# Patient Record
Sex: Female | Born: 1970
Health system: Southern US, Community
[De-identification: ages and names within clinical notes are randomized; demographics above are authoritative.]

## PROBLEM LIST (undated history)

## (undated) DIAGNOSIS — K219 Gastro-esophageal reflux disease without esophagitis: Secondary | ICD-10-CM

## (undated) DIAGNOSIS — K2970 Gastritis, unspecified, without bleeding: Secondary | ICD-10-CM

## (undated) DIAGNOSIS — E119 Type 2 diabetes mellitus without complications: Secondary | ICD-10-CM

## (undated) DIAGNOSIS — I10 Essential (primary) hypertension: Secondary | ICD-10-CM

## (undated) DIAGNOSIS — E785 Hyperlipidemia, unspecified: Secondary | ICD-10-CM

## (undated) HISTORY — DX: Hyperlipidemia, unspecified: E78.5

## (undated) HISTORY — DX: Type 2 diabetes mellitus without complications: E11.9

## (undated) HISTORY — DX: Gastro-esophageal reflux disease without esophagitis: K21.9

## (undated) HISTORY — DX: Essential (primary) hypertension: I10

## (undated) HISTORY — PX: PLANTAR'S WART EXCISION: SHX2240

---

## 1998-08-10 HISTORY — PX: FOOT SURGERY: SHX648

## 2013-06-24 ENCOUNTER — Ambulatory Visit: Payer: Self-pay | Attending: Internal Medicine

## 2013-06-30 ENCOUNTER — Ambulatory Visit: Payer: Self-pay | Attending: Internal Medicine

## 2013-07-14 ENCOUNTER — Ambulatory Visit: Payer: No Typology Code available for payment source | Attending: Internal Medicine | Admitting: Internal Medicine

## 2013-07-14 VITALS — BP 155/105 | HR 78 | Temp 98.9°F | Resp 16 | Ht 63.0 in | Wt 142.0 lb

## 2013-07-14 DIAGNOSIS — Z0000 Encounter for general adult medical examination without abnormal findings: Secondary | ICD-10-CM

## 2013-07-14 LAB — TSH: TSH: 0.911 u[IU]/mL (ref 0.350–4.500)

## 2013-07-14 LAB — CBC WITH DIFFERENTIAL/PLATELET
Basophils Relative: 0 % (ref 0–1)
Eosinophils Absolute: 0.1 10*3/uL (ref 0.0–0.7)
Eosinophils Relative: 2 % (ref 0–5)
HCT: 36.5 % (ref 36.0–46.0)
Hemoglobin: 13 g/dL (ref 12.0–15.0)
Lymphs Abs: 2.4 10*3/uL (ref 0.7–4.0)
MCH: 31 pg (ref 26.0–34.0)
MCHC: 35.6 g/dL (ref 30.0–36.0)
Monocytes Absolute: 0.4 10*3/uL (ref 0.1–1.0)
Monocytes Relative: 6 % (ref 3–12)
Neutro Abs: 3.9 10*3/uL (ref 1.7–7.7)
RBC: 4.2 MIL/uL (ref 3.87–5.11)
RDW: 12.9 % (ref 11.5–15.5)

## 2013-07-14 LAB — LIPID PANEL
Cholesterol: 185 mg/dL (ref 0–200)
LDL Cholesterol: 110 mg/dL — ABNORMAL HIGH (ref 0–99)
Triglycerides: 180 mg/dL — ABNORMAL HIGH (ref <150)

## 2013-07-14 LAB — BASIC METABOLIC PANEL
BUN: 10 mg/dL (ref 6–23)
CO2: 25 mEq/L (ref 19–32)
Creat: 0.45 mg/dL — ABNORMAL LOW (ref 0.50–1.10)
Glucose, Bld: 192 mg/dL — ABNORMAL HIGH (ref 70–99)
Potassium: 4 mEq/L (ref 3.5–5.3)
Sodium: 135 mEq/L (ref 135–145)

## 2013-07-14 MED ORDER — BUTALBITAL-APAP-CAFFEINE 50-325-40 MG PO TABS: 1.0000 | ORAL_TABLET | Freq: Four times a day (QID) | ORAL | Status: DC | PRN

## 2013-07-14 NOTE — Progress Notes (Signed)
Pt is here to establish care. Pt reports that her father died on 01/01/23 and ever since she heard this new she has been having chest pain and constant headaches.

## 2013-07-14 NOTE — Patient Instructions (Signed)
Dolor de cabeza, preguntas frecuentes y sus respuestas  (Headaches, Frequently Asked Questions)  CEFALEAS MIGRAÑOSAS  P: ¿Qué es la migraña? ¿Qué la ocasiona? ¿Cómo puedo tratarla?  R: En general, la migraña comienza como un dolor apagado. Luego progresa hacia un dolor, constante, punzante y como un latido. Sentirá este dolor en las sienes. Podrá sentir dolor en la parte anterior o posterior de la cabeza, o en uno o ambos lados. El dolor suele estar acompañado de una combinación de:  · Náuseas.  · Vómitos.  · Sensibilidad a la luz y los ruidos.  Algunas personas (un 15%) experimentan un aura (ver abajo) antes de un ataque. La causa de la migraña se debe a reacciones químicas del cerebro. El tratamiento para la migraña puede incluir medicamentos de venta libre. También puede incluir técnicas de autoayuda. Estas incluyen entrenamientos para la relajación y biorretroalimentación.   P: ¿Qué es un aura?  R: Alrededor del 15% de las personas con migraña tiene un "aura". Es una señal de síntomas neurológicos que ocurren antes de un dolor de cabeza por migrañas. Podrá ver líneas onduladas o irregulares, puntos o luces parpadeantes. Podrá experimentar visión de túnel o puntos ciegos en uno o ambos ojos. El aura puede incluir alucinaciones visuales o auditivas (algo que se imagina). Puede incluir trastornos en el olfato (como olores extraños), el tacto o el gusto. Entre otros síntomas se incluyen:  · Adormecimiento.  · Sensación de hormigueo.  · Dificultad para recordar o decir la palabra correcta.  Estos episodios neurológicos pueden durar hasta 60 minutos. Los síntomas desaparecerán a medida que el dolor de cabeza comience.  P:¿Qué es un disparador?  R: Ciertos factores físicos o ambientales pueden llevar a "disparar" una migraña. Estos son:  · Alimentos.  · Cambios hormonales.  · Clima.  · Estrés.  Es importante recordar que los disparadores son diferentes entre si. Para ayudar a prevenir ataques de migrañas, necesitará  descubrir cuáles son los disparadores que le afectan. Lleve un diario sobre sus dolores de cabeza. Este es un buen modo para descubrir los disparadores. El diario le ayudará en el momento de hablar con el profesional acerca de su enfermedad.  P: ¿El clima afecta en las migrañas?  R: La luz solar, el calor, la humedad y lo cambios drásticos en la presión barométrica pueden llevar a, o "disparar" un ataque de migraña en algunas personas. Pero estudios han demostrado que el clima no actúa como disparador para todas las personas con migraña.  P: ¿Cuál es la relación entre la migraña y la hormonas?  R: Las hormonas inician y regulan muchas de las funciones corporales. Las hormonas mantienen el balance en el cuerpo dentro de los constantes cambios de ambiente. Algunas veces, el nivel de hormonas en el cuerpo se desbalancea. Por ejemplo, durante la menstruación, el embarazo o la menopausia. Pueden ser la causa de un ataque de migraña. De hecho, alrededor de tres cuartos de las mujeres con migraña informan que sus ataques están relacionados con el ciclo menstrual.   P: ¿Aumenta el riesgo de sufrir un choque cardíaco en las personas que padecen migraña?  R: La probabilidad de que un ataque de migraña ocasione un ataque cardíaco es muy remota. Esto no quiere decir que una persona que sufre de migraña no pueda tener un ataque cardíaco asociado con ella. En las personas menores de 40 años, el factor más común para un ataque es la migraña. Pero durante la vida de una persona, la ocurrencia de un dolor de cabeza   por migraña está asociada con una reducción en el riesgo de morir por un ataque cerebrovascular.   P: ¿Cuáles son los medicamentos para la migraña?  R: La medicación precisa se utiliza para tratar el dolor de cabeza una vez que ha comenzado. Son ejemplos, medicamentos de venta libre, desinflamatorios sin esteroides, ergotamínicos y triptanos.   P: ¿Qué son los triptanos?  R: Lo triptanos son una nueva clase de  medicamentos abortivos. Son específicos para tratar este problema. Los triptanos son vasoconstrictores. Moderan algunas reacciones químicas del cerebro. Los triptanos trabajan como receptores del cerebro. Ayudan a restaurar el balance de un neurotransmisor denominado serotonina. Se cree que las fluctuaciones en los niveles de serotonina son la causa principal de la migraña.   P: ¿Son efectivos los medicamentos de venta libre para la migraña?  R: Los medicamentos de venta libre pueden ser efectivos para aliviar dolores leves a moderados y los síntomas asociados a la migraña. Pero deberá consultar a un médico antes de comenzar cualquier tratamiento para la migraña.   P: ¿Cuáles son los medicamentos de prevención de la migraña?  R: Se suele denominar tratamiento "profiláctico" a los medicamentos para la prevención de la migraña. Se utilizan para reducir la frecuencia, gravedad y duración de los ataques de migraña. Son ejemplos de medicamentos de prevención: antiepilépticos, antidepresivos, bloqueadores beta, bloqueadores de los canales de calcio y medicamentos antiinflamatorios sin esteroides.  P: ¿ Por qué se utilizan anticonvulsivantes para tratar la migraña?  R: Durante los últimos años, ha habido un creciente interés en las drogas antiepilépticas para la prevención de la migraña. A menudo se los conoce como "anticonvulsivantes". La epilepsia y la migraña suceden por reacciones similares en el cerebro.   P: ¿ Por qué se utilizan antidepresivos para tratar la migraña?  R: Los antidepresivos típicamente se utilizan para tratar a las personas con depresión. Pueden reducir la frecuencia de la migraña a través de la regulación de los niveles químicos, como la serotonina, en el cerebro.   P: ¿ Por qué se utilizan terapias alternativas para tratar la migraña?  R: El término "terapias alternativas" suelen utilizarse para describir los tratamientos que se considera que están por fuera de alcance la medicina occidental  convencional. Son ejemplos de las terapias alternativas: la acupuntura, la acupresión y el yoga. Otra terapia alternativa común es la terapia herbal. Se cree que algunas hierbas ayudan a aliviar los dolores de cabeza. Siempre consulte con el profesional acerca de las terapias alternativas antes de utilizarlas. Algunos productos herbales contienen arsénico y otras toxinas.  DOLORES DE CABEZA POR TENSIÓN  P: ¿Qué es un dolor de cabeza por tensión? ¿Qué lo ocasiona? ¿Cómo puedo tratarlo?  R: Los dolores de cabeza por tensión ocurren al azar. A menudo son el resultado de estrés temporario, ansiedad, fatiga o ira. Los síntomas incluyen dolor en las sienes, una sensación como de tener una banda alrededor de la cabeza (un dolor que "presiona"). Los síntomas pueden incluir una sensación de empuje, de presión y contracción de los músculos de la cabeza y el cuello. El dolor comienza en la frente, sienes o en la parte posterior de la cabeza y el cuello. El tratamiento para los dolores de cabeza por tensión puede incluir medicamentos de venta libre. También puede incluir técnicas de autoayuda con entrenamientos para la relajación y biorretroalimentación.  CEFALEA EN RACIMOS  P: ¿Qué es una cefalea en racimos? ¿Qué la ocasiona? ¿Cómo puedo tratarla?  R: La cefalea en racimos toma su nombre debido a que los ataques vienen   en grupos. El dolor aparece con poco o ningún aviso. Normalmente ocurre de un lado de la cabeza. Muchas veces el dolor viene acompañado de un lagrimeo u ojo rojo y goteo de la nariz del mismo lado que el dolor. Se cree que la causa es una reacción en las sustancias químicas del cerebro. Se describe como el caso más grave e intenso de cualquier tipo de dolor de cabeza. El tratamiento incluye medicamentos bajo receta y oxígeno.  CEFALEA SINUSAL  P: ¿Qué es una cefalea sinusal? ¿Qué la ocasiona? ¿Cómo puedo tratarla?  R: Cuando se inflama una cavidad en los huesos de la cara y el cráneo (sinus) ocasiona un dolor  localizado. Esta enfermedad generalmente es el resultado de una reacción alérgica, un tumor o una infección. Si el dolor de cabeza está ocasionado por un bloqueo del sinus, como una infección, probablemente tendrá fiebre. Una imagen de rayos X confirmará el bloqueo del sinus. El tratamiento indicado por el médico podrá incluir antibióticos para la infección, y también antihistamínicos o descongestivos.   DOLOR DE CABEZA POR EFECTO "REBOTE"  P: ¿Qué es un dolor de cabeza por efecto "rebote"? ¿Qué lo ocasiona? ¿Cómo puedo tratarlo?  R: Si se toman medicamentos para el dolor de cabeza muy a menudo puede llevar a la enfermedad conocida como "dolor de cabeza por rebote". Un patrón de abuso de medicamentos para el dolor de cabeza supone tomarlos más de dos veces por semana o en cantidades excesivas. Esto significa más que lo que indica el envase o el médico. Con los dolores de cabeza por rebote, los medicamentos no sólo dejan de aliviar el dolor sino que además comienzan a ocasionar dolores de cabeza. Los médicos tratan los dolores de cabeza por rebote mediante la disminución del medicamento del que se ha abusado. A veces el medico podrá sustituir gradualmente por un tipo diferente de tratamiento o medicación. Dejar de consumirlo podría ser difícil. El abuso regular de un medicamento aumenta el potencial que se produzcan efectos secundarios graves. Consulte con un médico si utiliza regularmente medicamentos para el dolor de cabeza más de dos días por semana o más de lo que indica el envase.  PREGUNTAS Y RESPUESTAS ADICIONALES  P: ¿Qué es la biorretroalimentación?  R: La biorretroalimentación es un tratamiento de autoayuda. La biorretroalimentación utiliza un equipamiento especial para controlar los movimientos involuntarios del cuerpo y las respuestas físicas. La biorretroalimentación controla:  · Respiración.  · Pulso.  · Latidos cardíacos.  · Temperatura.  · Tensión muscular.  · Actividad cerebrales.  La  biorretroalimentación le ayudará a mejorar y perfeccionar sus ejercicios de relajación. Aprenderá a controlar las respuestas físicas relacionadas con el estrés. Una vez que se dominan las técnicas no necesitará más el equipamiento.  P: ¿Son hereditarios los dolores de cabeza?  R: Según algunas estimaciones, aproximadamente 28 millones de estadounidenses sufren migraña. Cuatro de cada cinco (80%) informan una historia familiar de migraña. Los investigadores no pueden asegurar si se trata de un problema genético o una predisposición familiar. A pesar de esto, un niño tiene 50% de probabilidades de sufrir migraña si uno de sus padres la sufre. El niño tiene un 75% de probabilidades si ambos padres la sufren.   P. ¿Puede un niño tener migraña?  R: En el momento de ingresar a la escuela secundaria, la mayoría de los jóvenes han experimentado algún tipo de cefalea. Algunos abordajes o medicamentos seguros y efectivos pueden evitar las cefaleas o detenerlas luego de que han comenzado.   P. ¿Qué tipo de   especialista debe ver para diagnosticar y tratar una cefalea?  R: Comience con su médico de cabecera. Converse acerca de su experiencia y abordaje de las cefaleas. Comente los métodos de clasificación, diagnóstico y tratamiento. El profesional decidirá si lo derivará a un especialista, según los síntomas u otras enfermedades. El hecho de sufrir diabetes, alergias, etc, puede requerir un abordaje más complejo. La [employer] ([employer] las Cefaleas) proporcionará, a pedido, una lista de los médicos que son miembros de su estado.  Document Released: 07/09/2008 Document Revised: 10/19/2011  Exit[employer]® Patient Information ©2014 [employer].

## 2013-07-14 NOTE — Progress Notes (Signed)
Patient ID: Connie Russell, female   DOB: 06-05-1971, 42 y.o.   MRN: [id number]  CC: establish care  HPI: 41 year old female with no significant past medical history who presented to clinic to establish PCP. No complaints at this time., Does not take any meds.  No Known Allergies History reviewed. No pertinent past medical history. No current outpatient prescriptions on file prior to visit.   No current facility-administered medications on file prior to visit.   Family History  Problem Relation Age of Onset  . Diabetes Mother   . Hypertension Mother   . Diabetes Maternal Grandmother    History   Social History  . Marital Status: Unknown    Spouse Name: N/A    Number of Children: N/A  . Years of Education: N/A   Occupational History  . Not on file.   Social History Main Topics  . Smoking status: Never Smoker   . Smokeless tobacco: Not on file  . Alcohol Use: No  . Drug Use: No  . Sexual Activity: Yes   Other Topics Concern  . Not on file   Social History Narrative  . No narrative on file    Review of Systems  Constitutional: Negative for fever, chills, diaphoresis, activity change, appetite change and fatigue.  HENT: Negative for ear pain, nosebleeds, congestion, facial swelling, rhinorrhea, neck pain, neck stiffness and ear discharge.   Eyes: Negative for pain, discharge, redness, itching and visual disturbance.  Respiratory: Negative for cough, choking, chest tightness, shortness of breath, wheezing and stridor.   Cardiovascular: Negative for chest pain, palpitations and leg swelling.  Gastrointestinal: Negative for abdominal distention.  Genitourinary: Negative for dysuria, urgency, frequency, hematuria, flank pain, decreased urine volume, difficulty urinating and dyspareunia.  Musculoskeletal: Negative for back pain, joint swelling, arthralgias and gait problem.  Neurological: Negative for dizziness, tremors, seizures, syncope, facial asymmetry, speech  difficulty, weakness, light-headedness, numbness and headaches.  Hematological: Negative for adenopathy. Does not bruise/bleed easily.  Psychiatric/Behavioral: Negative for hallucinations, behavioral problems, confusion, dysphoric mood, decreased concentration and agitation.    Objective:   Filed Vitals:   07/14/13 1648  BP: 155/105  Pulse: 78  Temp: 98.9 F (37.2 C)  Resp: 16    Physical Exam  Constitutional: Appears well-developed and well-nourished. No distress.  HENT: Normocephalic. External right and left ear normal. Oropharynx is clear and moist.  Eyes: Conjunctivae and EOM are normal. PERRLA, no scleral icterus.  Neck: Normal ROM. Neck supple. No JVD. No tracheal deviation. No thyromegaly.  CVS: RRR, S1/S2 +, no murmurs, no gallops, no carotid bruit.  Pulmonary: Effort and breath sounds normal, no stridor, rhonchi, wheezes, rales.  Abdominal: Soft. BS +,  no distension, tenderness, rebound or guarding.  Musculoskeletal: Normal range of motion. No edema and no tenderness.  Lymphadenopathy: No lymphadenopathy noted, cervical, inguinal. Neuro: Alert. Normal reflexes, muscle tone coordination. No cranial nerve deficit. Skin: Skin is warm and dry. No rash noted. Not diaphoretic. No erythema. No pallor.  Psychiatric: Normal mood and affect. Behavior, judgment, thought content normal.   No results found for this basename: WBC, HGB, HCT, MCV, PLT   No results found for this basename: CREATININE, BUN, NA, K, CL, CO2    No results found for this basename: HGBA1C   Lipid Panel  No results found for this basename: chol, trig, hdl, cholhdl, vldl, ldlcalc       Assessment and plan:   Patient Active Problem List   Diagnosis Date Noted  . Preventative health  care 07/14/2013    Priority: Medium - check blood work today.

## 2013-08-14 ENCOUNTER — Ambulatory Visit: Payer: No Typology Code available for payment source | Attending: Internal Medicine | Admitting: Internal Medicine

## 2013-08-14 ENCOUNTER — Encounter: Payer: Self-pay | Admitting: Internal Medicine

## 2013-08-14 VITALS — BP 158/89 | HR 82 | Temp 98.4°F | Resp 17 | Wt 143.8 lb

## 2013-08-14 DIAGNOSIS — IMO0001 Reserved for inherently not codable concepts without codable children: Secondary | ICD-10-CM

## 2013-08-14 DIAGNOSIS — H9202 Otalgia, left ear: Secondary | ICD-10-CM

## 2013-08-14 DIAGNOSIS — R7309 Other abnormal glucose: Secondary | ICD-10-CM

## 2013-08-14 DIAGNOSIS — H9209 Otalgia, unspecified ear: Secondary | ICD-10-CM

## 2013-08-14 DIAGNOSIS — E785 Hyperlipidemia, unspecified: Secondary | ICD-10-CM | POA: Insufficient documentation

## 2013-08-14 DIAGNOSIS — R739 Hyperglycemia, unspecified: Secondary | ICD-10-CM | POA: Insufficient documentation

## 2013-08-14 DIAGNOSIS — R030 Elevated blood-pressure reading, without diagnosis of hypertension: Secondary | ICD-10-CM | POA: Insufficient documentation

## 2013-08-14 DIAGNOSIS — I10 Essential (primary) hypertension: Secondary | ICD-10-CM | POA: Insufficient documentation

## 2013-08-14 LAB — POCT GLYCOSYLATED HEMOGLOBIN (HGB A1C): Hemoglobin A1C: 6.2

## 2013-08-14 MED ORDER — IBUPROFEN 600 MG PO TABS: 600.0000 mg | ORAL_TABLET | Freq: Three times a day (TID) | ORAL | Status: DC | PRN

## 2013-08-14 NOTE — Progress Notes (Signed)
MRN: [id number] Name: Connie Russell  Sex: female Age: 43 y.o. DOB: 07/26/1971  Allergies: Review of patient's allergies indicates no known allergies.  Chief Complaint  Patient presents with  . Follow-up    HPI: Patient is 43 y.o. female who comes today for followup her she had a blood work done which was reviewed with the patient noticed hyperglycemia, her hemoglobin A1c is 6.2%, today her blood pressure is also elevated denies any family history of diabetes, she reported to have left ear pain denies any discharge fever chills sore throat.  History reviewed. No pertinent past medical history.  History reviewed. No pertinent past surgical history.    Medication List       This list is accurate as of: 08/14/13  5:36 PM.  Always use your most recent med list.               butalbital-acetaminophen-caffeine 50-325-40 MG per tablet  Commonly known as:  FIORICET  Take 1-2 tablets by mouth every 6 (six) hours as needed for headache.     ibuprofen 600 MG tablet  Commonly known as:  ADVIL,MOTRIN  Take 1 tablet (600 mg total) by mouth every 8 (eight) hours as needed.        Meds ordered this encounter  Medications  . ibuprofen (ADVIL,MOTRIN) 600 MG tablet    Sig: Take 1 tablet (600 mg total) by mouth every 8 (eight) hours as needed.    Dispense:  30 tablet    Refill:  0    Immunization History  Administered Date(s) Administered  . Influenza Split 06/24/2013    Family History  Problem Relation Age of Onset  . Diabetes Mother   . Hypertension Mother   . Diabetes Maternal Grandmother     History  Substance Use Topics  . Smoking status: Never Smoker   . Smokeless tobacco: Not on file  . Alcohol Use: No    Review of Systems  As noted in HPI  Filed Vitals:   08/14/13 1657  BP: 158/89  Pulse: 82  Temp: 98.4 F (36.9 C)  Resp: 17    Physical Exam  Physical Exam  HENT:  Both TMs visualized not congested both are decreased in all clear minimal wax  no erythema  Eyes: EOM are normal. Pupils are equal, round, and reactive to light.  Cardiovascular: Normal rate and regular rhythm.   Pulmonary/Chest: Breath sounds normal. No respiratory distress. She has no wheezes. She has no rales.  Musculoskeletal: She exhibits no edema.    CBC    Component Value Date/Time   WBC 6.8 07/14/2013 1708   RBC 4.20 07/14/2013 1708   HGB 13.0 07/14/2013 1708   HCT 36.5 07/14/2013 1708   PLT 287 07/14/2013 1708   MCV 86.9 07/14/2013 1708   LYMPHSABS 2.4 07/14/2013 1708   MONOABS 0.4 07/14/2013 1708   EOSABS 0.1 07/14/2013 1708   BASOSABS 0.0 07/14/2013 1708    CMP     Component Value Date/Time   NA 135 07/14/2013 1708   K 4.0 07/14/2013 1708   CL 101 07/14/2013 1708   CO2 25 07/14/2013 1708   GLUCOSE 192* 07/14/2013 1708   BUN 10 07/14/2013 1708   CREATININE 0.45* 07/14/2013 1708   CALCIUM 8.8 07/14/2013 1708    Lab Results  Component Value Date/Time   CHOL 185 07/14/2013  5:08 PM    No components found with this basename: hga1c    No results found for this basename: AST  Assessment and Plan  Elevated BP  Other and unspecified hyperlipidemia  Hyperglycemia - Plan: HgB A1c 6.2%  Otalgia, left - Plan: ibuprofen (ADVIL,MOTRIN) 600 MG tablet  I have advised patient for low carbohydrate low-fat low-salt diet, she'll follow in 6 weeks if persistently elevated her blood pressure consider starting on antihypertensives.  Return in about 6 weeks (around 09/25/2013).  Doris Cheadle, MD

## 2013-08-14 NOTE — Progress Notes (Signed)
Patient here for follow up Complains of left ear pain past week Bothers her more at night

## 2013-08-14 NOTE — Patient Instructions (Signed)
Dieta reducida en sodio, 2 gramos de sodio (2 Gram Low Sodium Diet) Esta dieta restringe la cantidad de sodio a no ms de 2 gramos o 2000 miligramos (mg) por da. La cantidad de sodio se limita para ayudar a disminuir la presin arterial y es importante para la insuficiencia cardaca y los problemas renales y hepticos. Muchos alimentos contienen sodio para mejorar el sabor y algunas veces como conservantes. Por lo tanto, cuando es necesario disminuir la cantidad de sodio en la dieta, es importante saber que productos hay que buscar cuando se eligen alimentos y bebidas. Puede demorar algn tiempo acostumbrarse a realizar cambios dietarios. Aqu se incluye informacin y una gua que lo ayudarn a adaptarse a una dieta reducida en sodio.  CONSEJOS PRCTICOS  No le agregue sal a los alimentos.  Evite los comidas de preparacin rpida que generalmente son elevadas en sodio.  Elija colaciones sin sal.  Compre productos con bajo contenido de sodio, en los que en su etiqueta diga: "bajo contenido de sodio" o "sin agregado de sal".  Verifique las etiquetas de los alimentos para conocer cunto sodio hay en una porcin.  Cuando coma en un restaurante, pida que preparen su comida con menos sal, y sin nada de sal en lo posible. LECTURA DE LAS ETIQUETAS DE LOS ALIMENTOS PARA INFORMARSE ACERCA DEL SODIO QUE CONTIENEN. La seccin Informacin Nutricional es un buen lugar en el que hallar cunta cantidad de sodio contienen los alimentos. Busque productos que no contengan ms de 500-600 mg/comida y no ms de 150 mg. por porcin Recuerde que 2 g = 2000 mg. La etiqueta de los alimentos tambin puede informar de ste modo:  Sin contenido de sodio: Menos de cinco mg. por porcin.  Muy bajo contenido de sodio: 35 mg o menos por porcin.  Bajo contenido de sodio: 140 mg o menos por porcin.  Bajo en sodio: 50% menos de sodio por porcin. Por ejemplo, si un alimento generalmente contiene 300 mg de sodio se  modifica para ser considerado bajo en sodio, tendr 150 mg de sodio.  Reducido en sodio: 25% menos de sodio por porcin. Por ejemplo, si un alimento que generalmente tiene 400 mg de sodio se modifica para ser reducido en sodio, tendr 300 mg de sodio. ELECCIN DE LOS ALIMENTOS Granos  Evite: Galletitas y colaciones saladas. Algunos cereales, incluyendo cereales calientes. Panificados y mezclas para bizcochos. Arroz condimentado o mezclas para pastas.  Elija: Colaciones sin sal. Cereales con bajo contenido de sodio, avena, arroz y trigo inflado, cereal para el desayuno. Pan y bollitos tipo Ingls. Pastas. Carnes  Evite: Saladas, en lata, ahumadas, condimentadas, en escabeche incluyendo pescado y pollo. Tocino, jamn, salchichas, fiambres, panchos, anchoas.  Elija: Atn y salmn enlatado con bajo contenido de sodio. Carne, pollo o pescado frescos o congelados. Lcteos  Evite: Quesos procesados y cremosos. Queso cottage. Manteca y leche condensada. Quesos comunes.  Elija: Leche. Queso cottage con bajo contenido de sodio. Yogur. Crema. Quesos con bajo contenido de sodio. Frutas y Vegetales  Evite: Vegetales comunes en lata. Salsa de tomate y pastas en lata. Vegetales en salsas congelados. Aceitunas. Pickles. Salsas. Chucrut.  Elija: Vegetales en lata con bajo contenido de sodio. Salsa de tomate y pastas con bajo contenido de sodio. Vegetales frescos o congelados. Frutas frescas y congeladas. Condimentos  Evite: Salsas en lata y envasadas. Salsa [place]. Salsa trtara. Salsa Barbecue. Salsa de soja. Salsa de carne. Ketchup. Sal de cebolla, de ajo y sal de mesa. Saborizantes y tiernizantes para carne.  Elija:   Hierbas y especias frescas y secas. Variedades de mostaza y ketchup con bajo contenido de sodio. Jugo de limn. Salsa tabasco. Rbanos picantes. EJEMPLO DE PLAN ALIMENTARIO CON 2 GRAMOS DE SODIO Desayuno / Sodio (mg)  1 taza de leche descremada / 143 mg  2 rebanadas de pan  integral tostado / 270 mg  1 cucharada de margarina en tubo saludable para el corazn / 153 mg  1 huevo duro / 139 mg  1 naranja pequea / 0 mg Almuerzo / Sodio (mg)  1 taza de zanahorias crudas / 76 mg   taza de garbanzos / 298 mg  1 taza de leche descremada / 143 mg   taza de uvas negras / 2 mg  1 pan de pita de salvado / 356 mg Cena / Sodio (mg)  1 taza de pasta de salvado / 2 mg  1 taza de jugo de tomates bajo en sodio / 73 mg  3 onzas de carne picada magra / 57 mg  1 ensalada pequea (1 taza de espicanas crudas,  taza de pepinos,  taza de pimientos amarillos) con 1 cucharada de aceite de oliva y 1 cucharada de vinagre de vino / 25 mg Colacin / Sodio (mg)  1 pote de yogur de vainilla descremado / 107 mg  3 crackers de graham / 127 mg Anlisis nutricional  Caloras: 2033  Protenas: 77 g  Hidratos de carbono: 282 g  Grasa: 72 g  Sodio: 1.971 mg Document Released: 01/18/2006 Document Revised: 10/19/2011 [employer] Patient Information 2014 [employer].  

## 2013-09-25 ENCOUNTER — Ambulatory Visit: Payer: No Typology Code available for payment source | Admitting: Internal Medicine

## 2013-09-29 ENCOUNTER — Ambulatory Visit: Payer: No Typology Code available for payment source | Attending: Internal Medicine | Admitting: Internal Medicine

## 2013-09-29 ENCOUNTER — Encounter: Payer: Self-pay | Admitting: Internal Medicine

## 2013-09-29 VITALS — BP 149/100 | HR 77 | Temp 98.4°F | Resp 16

## 2013-09-29 DIAGNOSIS — R0609 Other forms of dyspnea: Secondary | ICD-10-CM | POA: Insufficient documentation

## 2013-09-29 DIAGNOSIS — R0989 Other specified symptoms and signs involving the circulatory and respiratory systems: Secondary | ICD-10-CM | POA: Insufficient documentation

## 2013-09-29 DIAGNOSIS — Z139 Encounter for screening, unspecified: Secondary | ICD-10-CM

## 2013-09-29 DIAGNOSIS — I10 Essential (primary) hypertension: Secondary | ICD-10-CM | POA: Insufficient documentation

## 2013-09-29 DIAGNOSIS — R7301 Impaired fasting glucose: Secondary | ICD-10-CM | POA: Insufficient documentation

## 2013-09-29 DIAGNOSIS — R079 Chest pain, unspecified: Secondary | ICD-10-CM

## 2013-09-29 MED ORDER — AMLODIPINE BESYLATE 5 MG PO TABS: 5.0000 mg | ORAL_TABLET | Freq: Every day | ORAL | Status: DC

## 2013-09-29 NOTE — Patient Instructions (Signed)
DASH Diet  The DASH diet stands for "Dietary Approaches to Stop Hypertension." It is a healthy eating plan that has been shown to reduce high blood pressure (hypertension) in as little as 14 days, while also possibly providing other significant health benefits. These other health benefits include reducing the risk of breast cancer after menopause and reducing the risk of type 2 diabetes, heart disease, colon cancer, and stroke. Health benefits also include weight loss and slowing kidney failure in patients with chronic kidney disease.   DIET GUIDELINES  · Limit salt (sodium). Your diet should contain less than 1500 mg of sodium daily.  · Limit refined or processed carbohydrates. Your diet should include mostly whole grains. Desserts and added sugars should be used sparingly.  · Include small amounts of heart-healthy fats. These types of fats include nuts, oils, and tub margarine. Limit saturated and trans fats. These fats have been shown to be harmful in the body.  CHOOSING FOODS   The following food groups are based on a 2000 calorie diet. See your Registered Dietitian for individual calorie needs.  Grains and Grain Products (6 to 8 servings daily)  · Eat More Often: Whole-wheat bread, brown rice, whole-grain or wheat pasta, quinoa, popcorn without added fat or salt (air popped).  · Eat Less Often: White bread, white pasta, white rice, cornbread.  Vegetables (4 to 5 servings daily)  · Eat More Often: Fresh, frozen, and canned vegetables. Vegetables may be raw, steamed, roasted, or grilled with a minimal amount of fat.  · Eat Less Often/Avoid: Creamed or fried vegetables. Vegetables in a cheese sauce.  Fruit (4 to 5 servings daily)  · Eat More Often: All fresh, canned (in natural juice), or frozen fruits. Dried fruits without added sugar. One hundred percent fruit juice (½ cup [237 mL] daily).  · Eat Less Often: Dried fruits with added sugar. Canned fruit in light or heavy syrup.  Lean Meats, Fish, and Poultry (2  servings or less daily. One serving is 3 to 4 oz [85-114 g]).  · Eat More Often: Ninety percent or leaner ground beef, tenderloin, sirloin. Round cuts of beef, chicken breast, turkey breast. All fish. Grill, bake, or broil your meat. Nothing should be fried.  · Eat Less Often/Avoid: Fatty cuts of meat, turkey, or chicken leg, thigh, or wing. Fried cuts of meat or fish.  Dairy (2 to 3 servings)  · Eat More Often: Low-fat or fat-free milk, low-fat plain or light yogurt, reduced-fat or part-skim cheese.  · Eat Less Often/Avoid: Milk (whole, 2%). Whole milk yogurt. Full-fat cheeses.  Nuts, Seeds, and Legumes (4 to 5 servings per week)  · Eat More Often: All without added salt.  · Eat Less Often/Avoid: Salted nuts and seeds, canned beans with added salt.  Fats and Sweets (limited)  · Eat More Often: Vegetable oils, tub margarines without trans fats, sugar-free gelatin. Mayonnaise and salad dressings.  · Eat Less Often/Avoid: Coconut oils, palm oils, butter, stick margarine, cream, half and half, cookies, candy, pie.  FOR MORE INFORMATION  The Dash Diet Eating Plan: www.dashdiet.org  Document Released: 07/16/2011 Document Revised: 10/19/2011 Document Reviewed: 07/16/2011  ExitCare® Patient Information ©2014 [employer].

## 2013-09-29 NOTE — Progress Notes (Signed)
MRN: [id number] Name: Connie Russell  Sex: female Age: 43 y.o. DOB: 1971-07-25  Allergies: Review of patient's allergies indicates no known allergies.  Chief Complaint  Patient presents with  . Follow-up    HPI: Patient is 43 y.o. female who comes today reported to have on and off chest pain and exertional dyspnea for the last 3-4 months, denies any headache or dizziness occasionally has PND denies any , She reported to have family history of heart disease, she also has history of hypertension as per patient one year ago she was taking some medication today her blood pressure is 149/100 currently patient denies any chest pain, denies smoking cigarettes, she has history of impaired fasting glucose and had been advised to cut down on carbohydrates.  History reviewed. No pertinent past medical history.  History reviewed. No pertinent past surgical history.    Medication List       This list is accurate as of: 09/29/13  5:06 PM.  Always use your most recent med list.               amLODipine 5 MG tablet  Commonly known as:  NORVASC  Take 1 tablet (5 mg total) by mouth daily.     butalbital-acetaminophen-caffeine 50-325-40 MG per tablet  Commonly known as:  FIORICET  Take 1-2 tablets by mouth every 6 (six) hours as needed for headache.     ibuprofen 600 MG tablet  Commonly known as:  ADVIL,MOTRIN  Take 1 tablet (600 mg total) by mouth every 8 (eight) hours as needed.        Meds ordered this encounter  Medications  . amLODipine (NORVASC) 5 MG tablet    Sig: Take 1 tablet (5 mg total) by mouth daily.    Dispense:  90 tablet    Refill:  3    Immunization History  Administered Date(s) Administered  . Influenza Split 06/24/2013    Family History  Problem Relation Age of Onset  . Diabetes Mother   . Hypertension Mother   . Diabetes Maternal Grandmother     History  Substance Use Topics  . Smoking status: Never Smoker   . Smokeless tobacco: Not on  file  . Alcohol Use: No    Review of Systems   As noted in HPI  Filed Vitals:   09/29/13 1640  BP: 149/100  Pulse: 77  Temp: 98.4 F (36.9 C)  Resp: 16    Physical Exam  Physical Exam  Constitutional: No distress.  Eyes: EOM are normal. Pupils are equal, round, and reactive to light.  Cardiovascular: Normal rate and regular rhythm.   Pulmonary/Chest: Breath sounds normal. No respiratory distress. She has no wheezes. She has no rales.  Musculoskeletal: She exhibits no edema.    CBC    Component Value Date/Time   WBC 6.8 07/14/2013 1708   RBC 4.20 07/14/2013 1708   HGB 13.0 07/14/2013 1708   HCT 36.5 07/14/2013 1708   PLT 287 07/14/2013 1708   MCV 86.9 07/14/2013 1708   LYMPHSABS 2.4 07/14/2013 1708   MONOABS 0.4 07/14/2013 1708   EOSABS 0.1 07/14/2013 1708   BASOSABS 0.0 07/14/2013 1708    CMP     Component Value Date/Time   NA 135 07/14/2013 1708   K 4.0 07/14/2013 1708   CL 101 07/14/2013 1708   CO2 25 07/14/2013 1708   GLUCOSE 192* 07/14/2013 1708   BUN 10 07/14/2013 1708   CREATININE 0.45* 07/14/2013 1708   CALCIUM  8.8 07/14/2013 1708    Lab Results  Component Value Date/Time   CHOL 185 07/14/2013  5:08 PM    No components found with this basename: hga1c    No results found for this basename: AST    Assessment and Plan  Chest pain/Exertional dyspnea  - Plan: EKG 12-Lead, no acute ST-T changes , continue with aspirin 81 mg daily Ambulatory referral to Cardiology  Essential hypertension, benign - Plan: I have advised patient follow salt diet and also started on  amLODipine (NORVASC) 5 MG tablet daily, patient will come back in 2 weeks for BP check.  IFG (impaired fasting glucose) Have advised patient for low carbohydrate diet, will get fasting blood glucose level on the next visit. Her  Screening - Plan: MM DIGITAL SCREENING BILATERAL   Fasting blood work on the next visit.   Return in about 6 weeks (around 11/10/2013) for BP check in 2 weeks .  Doris CheadleADVANI,  Geraldyne Barraclough, MD

## 2013-09-29 NOTE — Progress Notes (Signed)
Patient here for follow up Complains of having chest pains and pain down her left leg This has been going since september

## 2013-10-04 ENCOUNTER — Ambulatory Visit: Payer: No Typology Code available for payment source | Attending: Cardiology | Admitting: Cardiology

## 2013-10-04 ENCOUNTER — Encounter: Payer: Self-pay | Admitting: Cardiology

## 2013-10-04 VITALS — BP 142/94 | HR 82 | Temp 98.6°F | Resp 16 | Ht 64.0 in | Wt 138.0 lb

## 2013-10-04 DIAGNOSIS — R079 Chest pain, unspecified: Secondary | ICD-10-CM | POA: Insufficient documentation

## 2013-10-04 DIAGNOSIS — I10 Essential (primary) hypertension: Secondary | ICD-10-CM

## 2013-10-04 DIAGNOSIS — R0602 Shortness of breath: Secondary | ICD-10-CM | POA: Insufficient documentation

## 2013-10-04 DIAGNOSIS — R209 Unspecified disturbances of skin sensation: Secondary | ICD-10-CM | POA: Insufficient documentation

## 2013-10-04 MED ORDER — AMLODIPINE BESYLATE 5 MG PO TABS: 10.0000 mg | ORAL_TABLET | Freq: Every day | ORAL | Status: DC

## 2013-10-04 MED ORDER — AMLODIPINE BESYLATE 10 MG PO TABS: 10.0000 mg | ORAL_TABLET | Freq: Every day | ORAL | Status: DC

## 2013-10-04 NOTE — Progress Notes (Signed)
Ms Connie Russell comes today because of chest discomfort and numbness in her right arm. Since starting amlodipine 5 mg a day this has resolved. Her EKG is normal except for an incomplete right bundle which was obtained on February 20.  Her physical diagnosis shows improved blood pressure but not quite at goal. Heart rate is 60 at rest. Cardiovascular exam is normal.

## 2013-10-04 NOTE — Assessment & Plan Note (Signed)
I've asked her to increase her amlodipine to 10 mg per day. She's been advised to take each morning about the same time. She's also been advised to avoid as much sodium as possible. She will return to see primary care in 6 weeks. Blood pressure can be reassessed at that time. No cardiac workup at this time.

## 2013-10-04 NOTE — Progress Notes (Signed)
Pt is here because she had SOB, chest pain, and numbness in her right arm. Pt states that since she was put on BP medication she has not had anymore symptoms.

## 2013-10-13 ENCOUNTER — Ambulatory Visit: Payer: No Typology Code available for payment source | Attending: Internal Medicine

## 2013-10-13 VITALS — BP 115/75 | HR 83 | Resp 16

## 2013-10-13 DIAGNOSIS — Z136 Encounter for screening for cardiovascular disorders: Secondary | ICD-10-CM | POA: Insufficient documentation

## 2013-10-13 DIAGNOSIS — R51 Headache: Secondary | ICD-10-CM

## 2013-10-13 MED ORDER — BUTALBITAL-APAP-CAFFEINE 50-325-40 MG PO TABS: 1.0000 | ORAL_TABLET | Freq: Four times a day (QID) | ORAL | Status: AC | PRN

## 2013-10-13 MED ORDER — BUTALBITAL-APAP-CAFFEINE 50-325-40 MG PO TABS: 1.0000 | ORAL_TABLET | Freq: Four times a day (QID) | ORAL | Status: DC | PRN

## 2013-10-13 NOTE — Addendum Note (Signed)
Addended by: Allayne StackWINFREE, Satoria Dunlop R on: 10/13/2013 03:21 PM   Modules accepted: Orders

## 2013-10-13 NOTE — Patient Instructions (Signed)
Pt is to keep taking the medications as prescribed.

## 2013-10-13 NOTE — Addendum Note (Signed)
Addended by: Allayne StackWINFREE, Jenesys Casseus R on: 10/13/2013 03:28 PM   Modules accepted: Orders

## 2013-10-13 NOTE — Progress Notes (Signed)
Pt is here for a BP check only

## 2013-11-07 ENCOUNTER — Ambulatory Visit: Payer: No Typology Code available for payment source | Admitting: Internal Medicine

## 2014-01-04 ENCOUNTER — Ambulatory Visit: Payer: No Typology Code available for payment source | Attending: Internal Medicine | Admitting: *Deleted

## 2014-01-04 VITALS — BP 108/71 | HR 88 | Temp 98.1°F | Resp 16 | Wt 136.2 lb

## 2014-01-04 DIAGNOSIS — K5900 Constipation, unspecified: Secondary | ICD-10-CM

## 2014-01-04 MED ORDER — POLYETHYLENE GLYCOL 3350 17 GM/SCOOP PO POWD: 17.0000 g | Freq: Two times a day (BID) | ORAL | Status: DC | PRN

## 2014-01-04 NOTE — Patient Instructions (Signed)
Dieta con alto contenido de fibra  (High [employer] Diet) La fibra se encuentra en frutas, verduras y granos. Una dieta con alto contenido en fibras se favorece con la adicin de ms granos enteros, legumbres, frutas y verduras en su dieta. La cantidad recomendada de fibra para los hombres adultos es de 38 g por da. Para las mujeres adultas es de 25 g por da. Las [employer] y las que amamantan deben consumir 27 gramos de fibra por Training and development officer. Si usted tiene un problema digestivo o intestinal, consulte a su mdico antes de la adicin de alimentos ricos en fibra a su dieta. Coma una variedad de alimentos ricos en fibra en lugar de slo unos pocos.  OBJETIVO   Aumentar la masa fecal.  Tener deposiciones ms regulares para evitar el estreimiento.  Reducir el colesterol.  Para evitar comer en exceso. [place]?   En caso de estreimiento y hemorroides.  En caso de diverticulosis no complicada (enfermedad intestinal) y en el sndrome del colon irritable.  Si necesita ayuda para el control de [place].  Si desea mejorar su dieta como medida de proteccin contra la aterosclerosis, la diabetes y Science writer. [place] y cereales integrales.  Frutas, como las [place], [place], pltanos, fresas, Development worker, community y peras.  Verduras, como guisantes, zanahorias, batatas, remolachas, brcoli, repollo, espinacas y alcauciles.  Legumbres, las arvejas, soja, lentejas.  Almendras. CONTENIDO DE FIBRA DE LOS ALIMENTOS  Almidones y granos / Heritage manager (g)   Cheerios, 1 taza / 3 g  Corn Flakes, 1 taza / 0,7 g  Arroz inflado, 1  tazas / 0,3 g  Harina de avena instantnea (cocida),  taza / 2 g  Cereal de trigo escarchado, 1 taza / 5,1 g  Arroz marrn grano largo (cocido), 1 taza / 3,5 g  Arroz blanco grano largo (cocido), 1 taza / 0,6 g  Macarrones enriquecidos (cocidos), 1 taza / 2,5 g Legumbres / Fibra Diettica (g)   Frijoles cocidos (enlatados, crudos o  vegetarianos),  taza / 5,2 g  Frijoles (enlatados),  taza / 6,8 g  Frijoles pintos (cocidos),  taza / 5,5 g Panes y Administrator / Heritage manager (g)   Galletas de graham o miel, 2 plazas / 0,7 g  Galletitas saladas, 3 unidades / 0,3 g  Pretzels salados comunes, 10 pedazos / 1,8 g  Pan integral, 1 rebanada / 1,9 g  Pan blanco, 1 rebanada / 0,7 g  Pan con pasas, 1 rebanada / 1,2 g  Bagel 3 oz / 2 g  Tortilla de harina, 1 oz / 0.9 g  Tortilla de maz, 1 pequea / 1,5 g  Pan de [place]burguesa o hot dog, 1 pequeo / 0,9 g Frutas / Fibra Diettica (g)   Manzana con piel, 1 mediana / 4,4 g  Pur de [employer],  taza / 1,5 g  [place],  mediano / 1,5 g  Uvas, 10 uvas / 0,4 g  Naranja, 1 pequea / 2,3 g  Pasas, 1,5 oz / 1.6 g  Meln, 1 taza / 1,4 g Vegetales / Fibra Diettica (g)   Judas verdes (en conserva),  taza / 1,3 g  Zanahorias (cocido),  taza / 2,3 g  Broccoli (cocido),  taza / 2,8 g  Guisantes (cocidos),  taza / 4,4 g  Pur de papas,  taza / 1,6 g  Lechuga, 1 taza / 0,5 g  Maz (en lata),  taza / 1,6 g  Tomate,  taza / 1,1 g  1 cup / 3 g. Document Released: 07/27/2005 Document Revised: 01/26/2012 [facility] Patient Information 2014 [place], [place]. Constipacin (Constipation) Se llama constipacin cuando:   Elimina heces (mueve el intestino) menos de 3 veces por semana.  Tiene dificultad para mover el intestino.  Las heces son secas y duras o son ms grandes que lo normal. CUIDADOS EN EL HOGAR   Consuma ms fibra que se encuentra en frutas, verduras y granos enteros como arroz integral y frijoles.  Consuma menos alimentos ricos en grasas y azcar. Estos incluyen patatas fritas, hamburguesas, galletas, dulces y refrescos.  Si no consume suficientes alimentos ricos en fibras, tome productos que tengan agregado de fibra (suplementos).  Beba gran cantidad de lquido para mantener el pis (orina) de tono claro o amarillo plido.  Vaya  al bao cuando sienta la necesidad de ir. No espere.  Slo debe tomar los [employer] se los han recetado. No tome medicamentos que le ayuden a Licensed conveyancer intestino (laxantes) sin antes consultarlo con su mdico.  Haga ejercicio en forma regular, o como lo indique su mdico. SOLICITE AYUDA DE INMEDIATO SI:   Observa sangre brillante en las heces (materia fecal).  El estreimiento dura ms de 4 das o [place].  Siente dolor en el vientre (abdomen) o en el ano (recto).  Las heces son delgadas (como un lpiz).  Pierde peso de [place] inexplicable. ASEGRESE DE QUE:   Comprende estas instrucciones.  Controlar su enfermedad.  Solicitar ayuda de inmediato si no mejora o si empeora. Document Released: 08/29/2010 Document Revised: 10/19/2011 [facility] Patient Information 2014 [place], [place].

## 2014-01-04 NOTE — Progress Notes (Unsigned)
Patient in today complaining of stomach pain. Patient states she has no vomiting. Patient states she has not had a bowel movement in 2-3 days, only a small one with gas and bloating. Patient states she tried metamucil with no relief. Consulted with Cletus Gash, NP who prescribed Miralax twice a day until patient has a good bowel movement then can decrease to once a day. Reather Laurence, RN

## 2014-01-15 ENCOUNTER — Ambulatory Visit: Payer: No Typology Code available for payment source | Attending: Internal Medicine | Admitting: Internal Medicine

## 2014-01-15 ENCOUNTER — Encounter: Payer: Self-pay | Admitting: Internal Medicine

## 2014-01-15 VITALS — BP 122/80 | HR 73 | Temp 98.6°F | Resp 16 | Wt 138.6 lb

## 2014-01-15 DIAGNOSIS — Z139 Encounter for screening, unspecified: Secondary | ICD-10-CM

## 2014-01-15 DIAGNOSIS — K219 Gastro-esophageal reflux disease without esophagitis: Secondary | ICD-10-CM | POA: Insufficient documentation

## 2014-01-15 DIAGNOSIS — R531 Weakness: Secondary | ICD-10-CM

## 2014-01-15 DIAGNOSIS — I10 Essential (primary) hypertension: Secondary | ICD-10-CM | POA: Insufficient documentation

## 2014-01-15 DIAGNOSIS — K5900 Constipation, unspecified: Secondary | ICD-10-CM | POA: Insufficient documentation

## 2014-01-15 DIAGNOSIS — R5381 Other malaise: Secondary | ICD-10-CM

## 2014-01-15 DIAGNOSIS — Z79899 Other long term (current) drug therapy: Secondary | ICD-10-CM | POA: Insufficient documentation

## 2014-01-15 DIAGNOSIS — R5383 Other fatigue: Secondary | ICD-10-CM

## 2014-01-15 LAB — CBC WITH DIFFERENTIAL/PLATELET
BASOS ABS: 0 10*3/uL (ref 0.0–0.1)
BASOS PCT: 0 % (ref 0–1)
Eosinophils Absolute: 0.1 10*3/uL (ref 0.0–0.7)
Eosinophils Relative: 1 % (ref 0–5)
HEMATOCRIT: 38 % (ref 36.0–46.0)
Hemoglobin: 13.5 g/dL (ref 12.0–15.0)
LYMPHS PCT: 35 % (ref 12–46)
Lymphs Abs: 2.8 10*3/uL (ref 0.7–4.0)
MCH: 31.2 pg (ref 26.0–34.0)
MCHC: 35.5 g/dL (ref 30.0–36.0)
MCV: 87.8 fL (ref 78.0–100.0)
MONO ABS: 0.5 10*3/uL (ref 0.1–1.0)
Monocytes Relative: 6 % (ref 3–12)
Neutro Abs: 4.6 10*3/uL (ref 1.7–7.7)
Neutrophils Relative %: 58 % (ref 43–77)
PLATELETS: 328 10*3/uL (ref 150–400)
RBC: 4.33 MIL/uL (ref 3.87–5.11)
RDW: 12.5 % (ref 11.5–15.5)
WBC: 7.9 10*3/uL (ref 4.0–10.5)

## 2014-01-15 LAB — VITAMIN B12: Vitamin B-12: 665 pg/mL (ref 211–911)

## 2014-01-15 MED ORDER — OMEPRAZOLE 20 MG PO CPDR: 20.0000 mg | DELAYED_RELEASE_CAPSULE | Freq: Every day | ORAL | Status: DC

## 2014-01-15 NOTE — Progress Notes (Signed)
Patient here with interpreter Here for follow up on her blood pressure 

## 2014-01-15 NOTE — Progress Notes (Signed)
MRN: [id number] Name: Connie Russell  Sex: female Age: 43 y.o. DOB: October 01, 1970  Allergies: Review of patient's allergies indicates no known allergies.  Chief Complaint  Patient presents with  . Blood Pressure Check    HPI: Patient is 43 y.o. female who History of hypertension comes today for followup, as per patient she is taking her blood pressure medication regularly she is on Norvasc 10 mg, she also has history of constipation and was prescribed MiraLAX and reports some improvement, also patient is complaining of lot of GERD symptoms and recently has been feeling tired denies any irregular menstrual periods or menorrhagia. As per patient she is up-to-date with Pap smear. She has not had a mammogram done recently.  History reviewed. No pertinent past medical history.  History reviewed. No pertinent past surgical history.    Medication List       This list is accurate as of: 01/15/14  3:55 PM.  Always use your most recent med list.               amLODipine 10 MG tablet  Commonly known as:  NORVASC  Take 1 tablet (10 mg total) by mouth daily.     butalbital-acetaminophen-caffeine 50-325-40 MG per tablet  Commonly known as:  FIORICET  Take 1-2 tablets by mouth every 6 (six) hours as needed for headache.     ibuprofen 600 MG tablet  Commonly known as:  ADVIL,MOTRIN  Take 1 tablet (600 mg total) by mouth every 8 (eight) hours as needed.     omeprazole 20 MG capsule  Commonly known as:  PRILOSEC  Take 1 capsule (20 mg total) by mouth daily.     polyethylene glycol powder powder  Commonly known as:  GLYCOLAX/MIRALAX  Take 17 g by mouth 2 (two) times daily as needed.        Meds ordered this encounter  Medications  . omeprazole (PRILOSEC) 20 MG capsule    Sig: Take 1 capsule (20 mg total) by mouth daily.    Dispense:  30 capsule    Refill:  3    Immunization History  Administered Date(s) Administered  . Influenza Split 06/24/2013    Family History    Problem Relation Age of Onset  . Diabetes Mother   . Hypertension Mother   . Diabetes Maternal Grandmother     History  Substance Use Topics  . Smoking status: Never Smoker   . Smokeless tobacco: Not on file  . Alcohol Use: No    Review of Systems   As noted in HPI  Filed Vitals:   01/15/14 1539  BP: 122/80  Pulse: 73  Temp: 98.6 F (37 C)  Resp: 16    Physical Exam  Physical Exam  Constitutional: No distress.  Eyes: EOM are normal. Pupils are equal, round, and reactive to light.  Cardiovascular: Normal rate and regular rhythm.   Pulmonary/Chest: Breath sounds normal. No respiratory distress. She has no wheezes. She has no rales.  Abdominal: Soft. There is no tenderness. There is no rebound.    CBC    Component Value Date/Time   WBC 6.8 07/14/2013 1708   RBC 4.20 07/14/2013 1708   HGB 13.0 07/14/2013 1708   HCT 36.5 07/14/2013 1708   PLT 287 07/14/2013 1708   MCV 86.9 07/14/2013 1708   LYMPHSABS 2.4 07/14/2013 1708   MONOABS 0.4 07/14/2013 1708   EOSABS 0.1 07/14/2013 1708   BASOSABS 0.0 07/14/2013 1708    CMP  Component Value Date/Time   NA 135 07/14/2013 1708   K 4.0 07/14/2013 1708   CL 101 07/14/2013 1708   CO2 25 07/14/2013 1708   GLUCOSE 192* 07/14/2013 1708   BUN 10 07/14/2013 1708   CREATININE 0.45* 07/14/2013 1708   CALCIUM 8.8 07/14/2013 1708    Lab Results  Component Value Date/Time   CHOL 185 07/14/2013  5:08 PM    No components found with this basename: hga1c    No results found for this basename: AST    Assessment and Plan  Hypertension, essential Blood pressure is well controlled continue with Norvasc 10 mg daily.  Unspecified constipation Continue with current medications, advise for increased fluid and fiber.  GERD (gastroesophageal reflux disease) - Plan: Trial of omeprazole (PRILOSEC) 20 MG capsule  Weakness - Plan: Will check CBC with Differential, Vit D  25 hydroxy (rtn osteoporosis monitoring), Vitamin B12  Screening -  Plan: MM DIGITAL SCREENING BILATERAL   Health Maintenance  -Mammogram: Ordered    Return in about 3 months (around 04/17/2014) for hypertension.  Doris Cheadle, MD

## 2014-01-16 ENCOUNTER — Telehealth: Payer: Self-pay

## 2014-01-16 LAB — VITAMIN D 25 HYDROXY (VIT D DEFICIENCY, FRACTURES): Vit D, 25-Hydroxy: 34 ng/mL (ref 30–89)

## 2014-01-16 NOTE — Telephone Encounter (Signed)
Message copied by Lestine Mount on Tue Jan 16, 2014 10:08 AM ------      Message from: Doris Cheadle      Created: Tue Jan 16, 2014  9:52 AM       Call and let the Patient know that blood work is normal.       ------

## 2014-01-16 NOTE — Telephone Encounter (Signed)
Interpreter line used Patient is aware of her lab results 

## 2014-01-17 ENCOUNTER — Ambulatory Visit: Payer: No Typology Code available for payment source | Attending: Internal Medicine

## 2014-02-15 ENCOUNTER — Ambulatory Visit: Payer: No Typology Code available for payment source | Attending: Internal Medicine | Admitting: Internal Medicine

## 2014-02-15 ENCOUNTER — Encounter: Payer: Self-pay | Admitting: Internal Medicine

## 2014-02-15 VITALS — BP 119/73 | HR 92 | Temp 98.2°F | Resp 16

## 2014-02-15 DIAGNOSIS — R739 Hyperglycemia, unspecified: Secondary | ICD-10-CM

## 2014-02-15 DIAGNOSIS — L259 Unspecified contact dermatitis, unspecified cause: Secondary | ICD-10-CM

## 2014-02-15 DIAGNOSIS — R300 Dysuria: Secondary | ICD-10-CM

## 2014-02-15 DIAGNOSIS — R7309 Other abnormal glucose: Secondary | ICD-10-CM

## 2014-02-15 LAB — POCT URINALYSIS DIPSTICK
Bilirubin, UA: NEGATIVE
GLUCOSE UA: NEGATIVE
Ketones, UA: NEGATIVE
Leukocytes, UA: NEGATIVE
Nitrite, UA: NEGATIVE
Protein, UA: NEGATIVE
RBC UA: NEGATIVE
SPEC GRAV UA: 1.025
UROBILINOGEN UA: 0.2
pH, UA: 6

## 2014-02-15 LAB — POCT GLYCOSYLATED HEMOGLOBIN (HGB A1C): Hemoglobin A1C: 5.8

## 2014-02-15 MED ORDER — DIPHENHYDRAMINE HCL 25 MG PO TABS: 25.0000 mg | ORAL_TABLET | Freq: Four times a day (QID) | ORAL | Status: DC | PRN

## 2014-02-15 MED ORDER — HYDROCORTISONE 2.5 % EX CREA: TOPICAL_CREAM | Freq: Two times a day (BID) | CUTANEOUS | Status: DC

## 2014-02-15 MED ORDER — METHYLPREDNISOLONE SODIUM SUCC 125 MG IJ SOLR
125.0000 mg | Freq: Once | INTRAMUSCULAR | Status: AC
  Administered 2014-02-15: 125 mg via INTRAMUSCULAR

## 2014-02-15 NOTE — Progress Notes (Signed)
   Subjective:    Patient ID: Connie Russell, female    DOB: 1970/08/15, 43 y.o.   MRN: [id number]  Rash This is a new problem. The current episode started 1 to 4 weeks ago. The problem has been gradually worsening since onset. The rash is diffuse. The rash is characterized by blistering, draining, redness, itchiness and burning. She was exposed to plant contact. Past treatments include anti-itch cream (medication her daughter was prescribed).      Review of Systems  Constitutional: Negative.   Genitourinary: Positive for dysuria. Negative for vaginal discharge.  Skin: Positive for rash.       Objective:   Physical Exam  Constitutional: She is oriented to person, place, and time. She appears well-developed and well-nourished.  HENT:  Head: Normocephalic.  Cardiovascular: Normal rate and regular rhythm.   Pulmonary/Chest: Effort normal and breath sounds normal.  Genitourinary: Vaginal discharge found.  Musculoskeletal: Normal range of motion.  Neurological: She is alert and oriented to person, place, and time.  Skin: Skin is warm and dry. Rash noted. There is erythema.             Assessment & Plan:  Connie Russell was seen today for rash.  Diagnoses and associated orders for this visit:  Contact dermatitis - methylPREDNISolone sodium succinate (SOLU-MEDROL) 125 mg/2 mL injection 125 mg; Inject 2 mLs (125 mg total) into the muscle once. - Discontinue: hydrocortisone 2.5 % cream; Apply topically 2 (two) times daily. - diphenhydrAMINE (BENADRYL) 25 MG tablet; Take 1 tablet (25 mg total) by mouth every 6 (six) hours as needed for itching. - hydrocortisone 2.5 % cream; Apply topically 2 (two) times daily. Apply to rash areas, do not apply to face  Dysuria - POCT urinalysis dipstick  Hyperglycemia - POCT glycosylated hemoglobin (Hb A1C)   Holland CommonsKECK, VALERIE, NP 02/15/2014 6:15 PM

## 2014-02-15 NOTE — Progress Notes (Signed)
Patient here today for c/o rash on both arms, hands, lower extremities and vaginal area. Patient states her daughters have the same rash and were diagnosed with Poison Ivy. Patient states onset was last Wednesday. Patient states the rash itches and burns. Patient has tried different ointments with no relief.

## 2014-04-18 ENCOUNTER — Ambulatory Visit: Payer: No Typology Code available for payment source | Attending: Internal Medicine | Admitting: Internal Medicine

## 2014-04-18 ENCOUNTER — Encounter: Payer: Self-pay | Admitting: Internal Medicine

## 2014-04-18 VITALS — BP 109/72 | HR 92 | Temp 97.9°F | Resp 22 | Ht 62.5 in | Wt 147.4 lb

## 2014-04-18 DIAGNOSIS — I10 Essential (primary) hypertension: Secondary | ICD-10-CM | POA: Insufficient documentation

## 2014-04-18 DIAGNOSIS — M25561 Pain in right knee: Secondary | ICD-10-CM

## 2014-04-18 DIAGNOSIS — K219 Gastro-esophageal reflux disease without esophagitis: Secondary | ICD-10-CM | POA: Insufficient documentation

## 2014-04-18 DIAGNOSIS — H538 Other visual disturbances: Secondary | ICD-10-CM | POA: Insufficient documentation

## 2014-04-18 DIAGNOSIS — M25569 Pain in unspecified knee: Secondary | ICD-10-CM

## 2014-04-18 DIAGNOSIS — Z79899 Other long term (current) drug therapy: Secondary | ICD-10-CM | POA: Insufficient documentation

## 2014-04-18 DIAGNOSIS — R002 Palpitations: Secondary | ICD-10-CM | POA: Insufficient documentation

## 2014-04-18 MED ORDER — IBUPROFEN 600 MG PO TABS: 600.0000 mg | ORAL_TABLET | Freq: Three times a day (TID) | ORAL | Status: DC | PRN

## 2014-04-18 MED ORDER — TRAMADOL HCL 50 MG PO TABS: 50.0000 mg | ORAL_TABLET | Freq: Three times a day (TID) | ORAL | Status: DC | PRN

## 2014-04-18 NOTE — Progress Notes (Signed)
MRN: [id number] Name: Connie Russell  Sex: female Age: 43 y.o. DOB: 1971-01-03  Allergies: Review of patient's allergies indicates no known allergies.  Chief Complaint  Patient presents with  . Follow-up  . Hypertension    HPI: Patient is 43 y.o. female who has history of hypertension, GERD, hyperlipidemia comes today reported to have palpitation and shortness of breath a few nights ago which lasted for a few hours, denies any chest pain headache or dizziness, does complain of blurry vision, her blood pressure is well controlled, she also reports occasional lower extremity edema, also complaining of right knee pain on and off for several months she denies any recent fall or trauma, as per patient she used to see a specialist and got injection in the past.  Past Medical History  Diagnosis Date  . Hypertension     History reviewed. No pertinent past surgical history.    Medication List       This list is accurate as of: 04/18/14  3:22 PM.  Always use your most recent med list.               amLODipine 10 MG tablet  Commonly known as:  NORVASC  Take 1 tablet (10 mg total) by mouth daily.     butalbital-acetaminophen-caffeine 50-325-40 MG per tablet  Commonly known as:  FIORICET  Take 1-2 tablets by mouth every 6 (six) hours as needed for headache.     diphenhydrAMINE 25 MG tablet  Commonly known as:  BENADRYL  Take 1 tablet (25 mg total) by mouth every 6 (six) hours as needed for itching.     hydrocortisone 2.5 % cream  Apply topically 2 (two) times daily. Apply to rash areas, do not apply to face     ibuprofen 600 MG tablet  Commonly known as:  ADVIL,MOTRIN  Take 1 tablet (600 mg total) by mouth every 8 (eight) hours as needed.     omeprazole 20 MG capsule  Commonly known as:  PRILOSEC  Take 1 capsule (20 mg total) by mouth daily.     polyethylene glycol powder powder  Commonly known as:  GLYCOLAX/MIRALAX  Take 17 g by mouth 2 (two) times daily as  needed.     traMADol 50 MG tablet  Commonly known as:  ULTRAM  Take 1 tablet (50 mg total) by mouth every 8 (eight) hours as needed for moderate pain.        Meds ordered this encounter  Medications  . traMADol (ULTRAM) 50 MG tablet    Sig: Take 1 tablet (50 mg total) by mouth every 8 (eight) hours as needed for moderate pain.    Dispense:  30 tablet    Refill:  0  . ibuprofen (ADVIL,MOTRIN) 600 MG tablet    Sig: Take 1 tablet (600 mg total) by mouth every 8 (eight) hours as needed.    Dispense:  30 tablet    Refill:  3    Immunization History  Administered Date(s) Administered  . Influenza Split 06/24/2013    Family History  Problem Relation Age of Onset  . Hypertension Mother   . Diabetes Maternal Grandmother     History  Substance Use Topics  . Smoking status: Never Smoker   . Smokeless tobacco: Not on file  . Alcohol Use: No    Review of Systems   As noted in HPI  Filed Vitals:   04/18/14 1436  BP: 109/72  Pulse: 92  Temp: 97.9 F (36.6  C)  Resp: 22    Physical Exam  Physical Exam  Constitutional: No distress.  Eyes: EOM are normal. Pupils are equal, round, and reactive to light.  Cardiovascular: Normal rate.   Pulmonary/Chest: Breath sounds normal. No respiratory distress. She has no wheezes. She has no rales.  Musculoskeletal: She exhibits no edema.  Right knee tenderness anteriorly , good ROM, crepiation+ve    CBC    Component Value Date/Time   WBC 7.9 01/15/2014 1554   RBC 4.33 01/15/2014 1554   HGB 13.5 01/15/2014 1554   HCT 38.0 01/15/2014 1554   PLT 328 01/15/2014 1554   MCV 87.8 01/15/2014 1554   LYMPHSABS 2.8 01/15/2014 1554   MONOABS 0.5 01/15/2014 1554   EOSABS 0.1 01/15/2014 1554   BASOSABS 0.0 01/15/2014 1554    CMP     Component Value Date/Time   NA 135 07/14/2013 1708   K 4.0 07/14/2013 1708   CL 101 07/14/2013 1708   CO2 25 07/14/2013 1708   GLUCOSE 192* 07/14/2013 1708   BUN 10 07/14/2013 1708   CREATININE 0.45* 07/14/2013 1708    CALCIUM 8.8 07/14/2013 1708    Lab Results  Component Value Date/Time   CHOL 185 07/14/2013  5:08 PM    No components found with this basename: hga1c    No results found for this basename: AST    Assessment and Plan  Right knee pain - Plan: DG Knee Complete 4 Views Right, traMADol (ULTRAM) 50 MG tablet, ibuprofen (ADVIL,MOTRIN) 600 MG tablet  Palpitations - Plan: Maybe related to  anxiety Her last TSH level was normal, as patient reported occasional lower extremity edema, will get 2D Echocardiogram with bubble study  Blurry vision - Plan: Ambulatory referral to Ophthalmology  Hypertension, essential - Plan: Will repeat blood chemistry COMPLETE METABOLIC PANEL WITH GFR  Return in about 3 months (around 07/18/2014) for hypertension.  Doris Cheadle, MD

## 2014-04-18 NOTE — Progress Notes (Signed)
Patient presents for f/u on HTN C/O 7 month history of intermittent right knee pain, worsens at nght making it difficult to sleep Also c/o bilateral foot swelling for 2 months States 6 days ago woke with rapid heart beat and difficulty breathing that lasted 5 hours.  States at that time took aspirin 81 mg with no relief

## 2014-04-19 ENCOUNTER — Telehealth: Payer: Self-pay | Admitting: *Deleted

## 2014-04-19 LAB — COMPLETE METABOLIC PANEL WITH GFR
ALK PHOS: 58 U/L (ref 39–117)
ALT: 15 U/L (ref 0–35)
AST: 15 U/L (ref 0–37)
Albumin: 4.4 g/dL (ref 3.5–5.2)
BILIRUBIN TOTAL: 0.2 mg/dL (ref 0.2–1.2)
BUN: 11 mg/dL (ref 6–23)
CO2: 30 mEq/L (ref 19–32)
Calcium: 9.4 mg/dL (ref 8.4–10.5)
Chloride: 97 mEq/L (ref 96–112)
Creat: 0.63 mg/dL (ref 0.50–1.10)
GFR, Est African American: >89 mL/min
GFR, Est Non African American: >89 mL/min
Glucose, Bld: 149 mg/dL — ABNORMAL HIGH (ref 70–99)
POTASSIUM: 4.5 meq/L (ref 3.5–5.3)
Sodium: 136 mEq/L (ref 135–145)
TOTAL PROTEIN: 7.2 g/dL (ref 6.0–8.3)

## 2014-04-19 NOTE — Telephone Encounter (Signed)
Message copied by Dyann Kief on Thu Apr 19, 2014 12:22 PM ------      Message from: Doris Cheadle      Created: Thu Apr 19, 2014 10:22 AM       Blood work reviewed noticed impaired fasting glucose, call and advise patient for low carbohydrate diet.       ------

## 2014-04-19 NOTE — Telephone Encounter (Signed)
Pt aware of results 

## 2014-05-10 ENCOUNTER — Ambulatory Visit (HOSPITAL_COMMUNITY)
Admission: RE | Admit: 2014-05-10 | Discharge: 2014-05-10 | Disposition: A | Discharge status: Home / Self Care | Payer: No Typology Code available for payment source | Source: Ambulatory Visit | Attending: Internal Medicine | Admitting: Internal Medicine

## 2014-05-10 DIAGNOSIS — Z139 Encounter for screening, unspecified: Secondary | ICD-10-CM

## 2014-05-11 ENCOUNTER — Telehealth: Payer: Self-pay | Admitting: Internal Medicine

## 2014-05-21 ENCOUNTER — Ambulatory Visit: Payer: No Typology Code available for payment source

## 2014-05-25 ENCOUNTER — Ambulatory Visit: Payer: No Typology Code available for payment source | Attending: Internal Medicine | Admitting: Pharmacist

## 2014-05-25 DIAGNOSIS — Z029 Encounter for administrative examinations, unspecified: Secondary | ICD-10-CM | POA: Insufficient documentation

## 2014-05-25 DIAGNOSIS — Z23 Encounter for immunization: Secondary | ICD-10-CM

## 2014-09-07 ENCOUNTER — Ambulatory Visit: Payer: No Typology Code available for payment source

## 2014-10-10 ENCOUNTER — Other Ambulatory Visit: Payer: Self-pay | Admitting: Cardiology

## 2014-10-10 ENCOUNTER — Other Ambulatory Visit: Payer: Self-pay | Admitting: *Deleted

## 2014-10-10 ENCOUNTER — Other Ambulatory Visit: Payer: Self-pay | Admitting: Internal Medicine

## 2014-10-10 DIAGNOSIS — I10 Essential (primary) hypertension: Secondary | ICD-10-CM

## 2014-10-10 MED ORDER — AMLODIPINE BESYLATE 10 MG PO TABS: 10.0000 mg | ORAL_TABLET | Freq: Every day | ORAL | Status: DC

## 2014-10-10 NOTE — Telephone Encounter (Signed)
Pt called requesting medication refill on amLODipine (NORVASC) 10 MG tablet. Pt is completely out of medication, please f/u with pt.

## 2014-10-10 NOTE — Telephone Encounter (Signed)
Refill send to [facility] Pt was advised to schedule F/U appointment with PCP for futures refills

## 2014-10-22 ENCOUNTER — Encounter: Payer: Self-pay | Admitting: Internal Medicine

## 2014-10-22 ENCOUNTER — Ambulatory Visit: Payer: No Typology Code available for payment source | Attending: Internal Medicine | Admitting: Internal Medicine

## 2014-10-22 ENCOUNTER — Ambulatory Visit (HOSPITAL_COMMUNITY)
Admission: RE | Admit: 2014-10-22 | Discharge: 2014-10-22 | Disposition: A | Discharge status: Home / Self Care | Payer: No Typology Code available for payment source | Source: Ambulatory Visit | Attending: Internal Medicine | Admitting: Internal Medicine

## 2014-10-22 VITALS — BP 115/78 | HR 94 | Temp 98.0°F | Resp 16 | Wt 148.2 lb

## 2014-10-22 DIAGNOSIS — I10 Essential (primary) hypertension: Secondary | ICD-10-CM | POA: Insufficient documentation

## 2014-10-22 DIAGNOSIS — Z833 Family history of diabetes mellitus: Secondary | ICD-10-CM | POA: Insufficient documentation

## 2014-10-22 DIAGNOSIS — Z760 Encounter for issue of repeat prescription: Secondary | ICD-10-CM | POA: Insufficient documentation

## 2014-10-22 DIAGNOSIS — M25561 Pain in right knee: Secondary | ICD-10-CM | POA: Insufficient documentation

## 2014-10-22 DIAGNOSIS — K219 Gastro-esophageal reflux disease without esophagitis: Secondary | ICD-10-CM | POA: Insufficient documentation

## 2014-10-22 DIAGNOSIS — M179 Osteoarthritis of knee, unspecified: Secondary | ICD-10-CM | POA: Insufficient documentation

## 2014-10-22 DIAGNOSIS — Z8249 Family history of ischemic heart disease and other diseases of the circulatory system: Secondary | ICD-10-CM | POA: Insufficient documentation

## 2014-10-22 LAB — COMPLETE METABOLIC PANEL WITH GFR
ALT: 13 U/L (ref 0–35)
AST: 12 U/L (ref 0–37)
Albumin: 4.7 g/dL (ref 3.5–5.2)
Alkaline Phosphatase: 62 U/L (ref 39–117)
BILIRUBIN TOTAL: 0.3 mg/dL (ref 0.2–1.2)
BUN: 16 mg/dL (ref 6–23)
CO2: 24 meq/L (ref 19–32)
Calcium: 9.9 mg/dL (ref 8.4–10.5)
Chloride: 100 mEq/L (ref 96–112)
Creat: 0.69 mg/dL (ref 0.50–1.10)
GFR, Est African American: >89 mL/min
GFR, Est Non African American: >89 mL/min
Glucose, Bld: 254 mg/dL — ABNORMAL HIGH (ref 70–99)
POTASSIUM: 5.9 meq/L — AB (ref 3.5–5.3)
Sodium: 136 mEq/L (ref 135–145)
Total Protein: 7.7 g/dL (ref 6.0–8.3)

## 2014-10-22 MED ORDER — OMEPRAZOLE 20 MG PO CPDR: 20.0000 mg | DELAYED_RELEASE_CAPSULE | Freq: Every day | ORAL | Status: DC

## 2014-10-22 MED ORDER — AMLODIPINE BESYLATE 10 MG PO TABS: 10.0000 mg | ORAL_TABLET | Freq: Every day | ORAL | Status: DC

## 2014-10-22 NOTE — Progress Notes (Signed)
Patient is here for follow up on her right knee pain and blood pressure Needs refills on her medications

## 2014-10-22 NOTE — Patient Instructions (Signed)
DASH Eating Plan °DASH stands for "Dietary Approaches to Stop Hypertension." The DASH eating plan is a healthy eating plan that has been shown to reduce high blood pressure (hypertension). Additional health benefits may include reducing the risk of type 2 diabetes mellitus, heart disease, and stroke. The DASH eating plan may also help with weight loss. °WHAT DO I NEED TO KNOW ABOUT THE DASH EATING PLAN? °For the DASH eating plan, you will follow these general guidelines: °· Choose foods with a percent daily value for sodium of less than 5% (as listed on the food label). °· Use salt-free seasonings or herbs instead of table salt or sea salt. °· Check with your health care provider or pharmacist before using salt substitutes. °· Eat lower-sodium products, often labeled as "lower sodium" or "no salt added." °· Eat fresh foods. °· Eat more vegetables, fruits, and low-fat dairy products. °· Choose whole grains. Look for the word "whole" as the first word in the ingredient list. °· Choose fish and skinless chicken or turkey more often than red meat. Limit fish, poultry, and meat to 6 oz (170 g) each day. °· Limit sweets, desserts, sugars, and sugary drinks. °· Choose heart-healthy fats. °· Limit cheese to 1 oz (28 g) per day. °· Eat more home-cooked food and less restaurant, buffet, and fast food. °· Limit fried foods. °· Cook foods using methods other than frying. °· Limit canned vegetables. If you do use them, rinse them well to decrease the sodium. °· When eating at a restaurant, ask that your food be prepared with less salt, or no salt if possible. °WHAT FOODS CAN I EAT? °Seek help from a dietitian for individual calorie needs. °Grains °Whole grain or whole wheat bread. Brown rice. Whole grain or whole wheat pasta. Quinoa, bulgur, and whole grain cereals. Low-sodium cereals. Corn or whole wheat flour tortillas. Whole grain cornbread. Whole grain crackers. Low-sodium crackers. °Vegetables °Fresh or frozen vegetables  (raw, steamed, roasted, or grilled). Low-sodium or reduced-sodium tomato and vegetable juices. Low-sodium or reduced-sodium tomato sauce and paste. Low-sodium or reduced-sodium canned vegetables.  °Fruits °All fresh, canned (in natural juice), or frozen fruits. °Meat and Other Protein Products °Ground beef (85% or leaner), grass-fed beef, or beef trimmed of fat. Skinless chicken or turkey. Ground chicken or turkey. Pork trimmed of fat. All fish and seafood. Eggs. Dried beans, peas, or lentils. Unsalted nuts and seeds. Unsalted canned beans. °Dairy °Low-fat dairy products, such as skim or 1% milk, 2% or reduced-fat cheeses, low-fat ricotta or cottage cheese, or plain low-fat yogurt. Low-sodium or reduced-sodium cheeses. °Fats and Oils °Tub margarines without trans fats. Light or reduced-fat mayonnaise and salad dressings (reduced sodium). Avocado. Safflower, olive, or canola oils. Natural peanut or almond butter. °Other °Unsalted popcorn and pretzels. °The items listed above may not be a complete list of recommended foods or beverages. Contact your dietitian for more options. °WHAT FOODS ARE NOT RECOMMENDED? °Grains °White bread. White pasta. White rice. Refined cornbread. Bagels and croissants. Crackers that contain trans fat. °Vegetables °Creamed or fried vegetables. Vegetables in a cheese sauce. Regular canned vegetables. Regular canned tomato sauce and paste. Regular tomato and vegetable juices. °Fruits °Dried fruits. Canned fruit in light or heavy syrup. Fruit juice. °Meat and Other Protein Products °Fatty cuts of meat. Ribs, chicken wings, bacon, sausage, bologna, salami, chitterlings, fatback, hot dogs, bratwurst, and packaged luncheon meats. Salted nuts and seeds. Canned beans with salt. °Dairy °Whole or 2% milk, cream, half-and-half, and cream cheese. Whole-fat or sweetened yogurt. Full-fat   cheeses or blue cheese. Nondairy creamers and whipped toppings. Processed cheese, cheese spreads, or cheese  curds. °Condiments °Onion and garlic salt, seasoned salt, table salt, and sea salt. Canned and packaged gravies. Worcestershire sauce. Tartar sauce. Barbecue sauce. Teriyaki sauce. Soy sauce, including reduced sodium. Steak sauce. Fish sauce. Oyster sauce. Cocktail sauce. Horseradish. Ketchup and mustard. Meat flavorings and tenderizers. Bouillon cubes. Hot sauce. Tabasco sauce. Marinades. Taco seasonings. Relishes. °Fats and Oils °Butter, stick margarine, lard, shortening, ghee, and bacon fat. Coconut, palm kernel, or palm oils. Regular salad dressings. °Other °Pickles and olives. Salted popcorn and pretzels. °The items listed above may not be a complete list of foods and beverages to avoid. Contact your dietitian for more information. °WHERE CAN I FIND MORE INFORMATION? °[employer], [employer], [employer]: www.nhlbi.nih.gov/health/health-topics/topics/dash/ °Document Released: 07/16/2011 Document Revised: 12/11/2013 Document Reviewed: 05/31/2013 °ExitCare® Patient Information ©2015 [employer]. This information is not intended to replace advice given to you by your health care provider. Make sure you discuss any questions you have with your health care provider. ° °

## 2014-10-22 NOTE — Progress Notes (Signed)
MRN: [id number] Name: Connie Russell  Sex: female Age: 44 y.o. DOB: [birth year]  Allergies: Review of patient's allergies indicates no known allergies.  Chief Complaint  Patient presents with  . Follow-up    HPI: Patient is 44 y.o. female who history of GERD, hypertension, chronic right knee pain, comes today for followup , she's requesting refill on her medications, on the last visit xray was ordered for her right knee which she has not done yet, as per patient in the past she was seen the specialist and got injections, recently her symptoms are getting worse she is taking ibuprofen when necessary but sometimes the pain is more worse at night, she was prescribed tramadol which she has not taken yet.  Past Medical History  Diagnosis Date  . Hypertension     History reviewed. No pertinent past surgical history.    Medication List       This list is accurate as of: 10/22/14  3:48 PM.  Always use your most recent med list.               amLODipine 10 MG tablet  Commonly known as:  NORVASC  Take 1 tablet (10 mg total) by mouth daily.     diphenhydrAMINE 25 MG tablet  Commonly known as:  BENADRYL  Take 1 tablet (25 mg total) by mouth every 6 (six) hours as needed for itching.     hydrocortisone 2.5 % cream  Apply topically 2 (two) times daily. Apply to rash areas, do not apply to face     ibuprofen 600 MG tablet  Commonly known as:  ADVIL,MOTRIN  Take 1 tablet (600 mg total) by mouth every 8 (eight) hours as needed.     omeprazole 20 MG capsule  Commonly known as:  PRILOSEC  Take 1 capsule (20 mg total) by mouth daily.     polyethylene glycol powder powder  Commonly known as:  GLYCOLAX/MIRALAX  Take 17 g by mouth 2 (two) times daily as needed.     traMADol 50 MG tablet  Commonly known as:  ULTRAM  Take 1 tablet (50 mg total) by mouth every 8 (eight) hours as needed for moderate pain.        Meds ordered this encounter  Medications  . amLODipine  (NORVASC) 10 MG tablet    Sig: Take 1 tablet (10 mg total) by mouth daily.    Dispense:  30 tablet    Refill:  3  . omeprazole (PRILOSEC) 20 MG capsule    Sig: Take 1 capsule (20 mg total) by mouth daily.    Dispense:  30 capsule    Refill:  3    Immunization History  Administered Date(s) Administered  . Influenza Split 06/24/2013  . Influenza,inj,Quad PF,36+ Mos 05/25/2014    Family History  Problem Relation Age of Onset  . Hypertension Mother   . Diabetes Maternal Grandmother     History  Substance Use Topics  . Smoking status: Never Smoker   . Smokeless tobacco: Not on file  . Alcohol Use: No    Review of Systems   As noted in HPI  Filed Vitals:   10/22/14 1507  BP: 115/78  Pulse: 94  Temp: 98 F (36.7 C)  Resp: 16    Physical Exam  Physical Exam  Constitutional: No distress.  Eyes: EOM are normal. Pupils are equal, round, and reactive to light.  Cardiovascular: Normal rate and regular rhythm.   Pulmonary/Chest: Breath sounds normal. No  respiratory distress. She has no wheezes. She has no rales.  Musculoskeletal:  Right knee tenderness anteriorly , good ROM, crepiation+ve     CBC    Component Value Date/Time   WBC 7.9 01/15/2014 1554   RBC 4.33 01/15/2014 1554   HGB 13.5 01/15/2014 1554   HCT 38.0 01/15/2014 1554   PLT 328 01/15/2014 1554   MCV 87.8 01/15/2014 1554   LYMPHSABS 2.8 01/15/2014 1554   MONOABS 0.5 01/15/2014 1554   EOSABS 0.1 01/15/2014 1554   BASOSABS 0.0 01/15/2014 1554    CMP     Component Value Date/Time   NA 136 04/18/2014 1530   K 4.5 04/18/2014 1530   CL 97 04/18/2014 1530   CO2 30 04/18/2014 1530   GLUCOSE 149* 04/18/2014 1530   BUN 11 04/18/2014 1530   CREATININE 0.63 04/18/2014 1530   CALCIUM 9.4 04/18/2014 1530   PROT 7.2 04/18/2014 1530   ALBUMIN 4.4 04/18/2014 1530   AST 15 04/18/2014 1530   ALT 15 04/18/2014 1530   ALKPHOS 58 04/18/2014 1530   BILITOT 0.2 04/18/2014 1530   GFRNONAA >89 04/18/2014 1530     GFRAA >89 04/18/2014 1530    Lab Results  Component Value Date/Time   CHOL 185 07/14/2013 05:08 PM    No components found for: HGA1C  Lab Results  Component Value Date/Time   AST 15 04/18/2014 03:30 PM    Assessment and Plan  Essential hypertension, benign - Plan: blood pressure is well controlled, continue with current meds amLODipine (NORVASC) 10 MG tablet, COMPLETE METABOLIC PANEL WITH GFR  Gastroesophageal reflux disease without esophagitis - Plan: last modification, continue with omeprazole (PRILOSEC) 20 MG capsule  Right knee pain - Plan:  DG Knee Complete 4 Views Right , ibuprofen/tramadol when necessary for severe pain   Health Maintenance  -Pap Smear:as per patient she had one done in January -Mammogram:up-to-date -Vaccinations: Up-to-date with flu shot.  Return in about 3 months (around 01/22/2015) for hypertension.   This note has been created with Education officer, environmental. Any transcriptional errors are unintentional.    Doris Cheadle, MD

## 2014-10-25 ENCOUNTER — Telehealth: Payer: Self-pay

## 2014-10-25 NOTE — Telephone Encounter (Signed)
-----   Message from Doris Cheadleeepak Advani, MD sent at 10/23/2014  1:25 PM EDT ----- Call and let the patient know that her x-ray of any reported  IMPRESSION: Mild degenerative joint disease. No acute abnormality seen in the right knee.

## 2014-10-25 NOTE — Telephone Encounter (Signed)
In house interpreter used Patient not available Left message on voice mail to return our call 

## 2014-10-25 NOTE — Telephone Encounter (Signed)
Pt called returning nurse's phone call.

## 2014-10-29 ENCOUNTER — Telehealth: Payer: Self-pay | Admitting: Internal Medicine

## 2014-10-29 NOTE — Telephone Encounter (Signed)
Pt walked in to inquire about her results. Please f/u with pt.

## 2014-11-02 ENCOUNTER — Ambulatory Visit: Payer: No Typology Code available for payment source

## 2014-11-16 ENCOUNTER — Ambulatory Visit: Payer: No Typology Code available for payment source

## 2014-12-20 ENCOUNTER — Encounter: Payer: Self-pay | Admitting: Internal Medicine

## 2014-12-20 ENCOUNTER — Ambulatory Visit: Payer: No Typology Code available for payment source | Attending: Internal Medicine | Admitting: Internal Medicine

## 2014-12-20 VITALS — BP 126/88 | HR 94 | Temp 98.0°F | Resp 16 | Wt 148.8 lb

## 2014-12-20 DIAGNOSIS — E119 Type 2 diabetes mellitus without complications: Secondary | ICD-10-CM | POA: Insufficient documentation

## 2014-12-20 DIAGNOSIS — I10 Essential (primary) hypertension: Secondary | ICD-10-CM | POA: Insufficient documentation

## 2014-12-20 DIAGNOSIS — R1013 Epigastric pain: Secondary | ICD-10-CM | POA: Insufficient documentation

## 2014-12-20 DIAGNOSIS — R109 Unspecified abdominal pain: Secondary | ICD-10-CM

## 2014-12-20 DIAGNOSIS — M62830 Muscle spasm of back: Secondary | ICD-10-CM

## 2014-12-20 LAB — COMPLETE METABOLIC PANEL WITH GFR
ALT: 47 U/L — ABNORMAL HIGH (ref 0–35)
AST: 24 U/L (ref 0–37)
Albumin: 4.1 g/dL (ref 3.5–5.2)
Alkaline Phosphatase: 79 U/L (ref 39–117)
BUN: 13 mg/dL (ref 6–23)
CO2: 27 mEq/L (ref 19–32)
Calcium: 9.3 mg/dL (ref 8.4–10.5)
Chloride: 99 mEq/L (ref 96–112)
Creat: 0.6 mg/dL (ref 0.50–1.10)
GFR, Est African American: >89 mL/min
GFR, Est Non African American: >89 mL/min
Glucose, Bld: 198 mg/dL — ABNORMAL HIGH (ref 70–99)
Potassium: 4 mEq/L (ref 3.5–5.3)
Sodium: 135 mEq/L (ref 135–145)
Total Bilirubin: 0.3 mg/dL (ref 0.2–1.2)
Total Protein: 6.6 g/dL (ref 6.0–8.3)

## 2014-12-20 LAB — POCT URINALYSIS DIPSTICK
BILIRUBIN UA: NEGATIVE
GLUCOSE UA: 500
Ketones, UA: NEGATIVE
Leukocytes, UA: NEGATIVE
NITRITE UA: NEGATIVE
PH UA: 5.5
Protein, UA: NEGATIVE
RBC UA: NEGATIVE
SPEC GRAV UA: 1.02
Urobilinogen, UA: 0.2

## 2014-12-20 LAB — POCT GLYCOSYLATED HEMOGLOBIN (HGB A1C): Hemoglobin A1C: 9.2

## 2014-12-20 LAB — LIPASE: Lipase: 43 U/L (ref 0–75)

## 2014-12-20 MED ORDER — FREESTYLE SYSTEM KIT: 1.0000 | PACK | Freq: Three times a day (TID) | Status: DC

## 2014-12-20 MED ORDER — CYCLOBENZAPRINE HCL 10 MG PO TABS: 10.0000 mg | ORAL_TABLET | Freq: Every day | ORAL | Status: DC

## 2014-12-20 MED ORDER — AMLODIPINE BESYLATE 10 MG PO TABS: 10.0000 mg | ORAL_TABLET | Freq: Every day | ORAL | Status: DC

## 2014-12-20 MED ORDER — METFORMIN HCL 500 MG PO TABS: 500.0000 mg | ORAL_TABLET | Freq: Two times a day (BID) | ORAL | Status: DC

## 2014-12-20 NOTE — Progress Notes (Signed)
MRN: [id number] Name: Connie Russell  Sex: female Age: 44 y.o. DOB: [birth year]  Allergies: Review of patient's allergies indicates no known allergies.  Chief Complaint  Patient presents with  . Flank Pain    HPI: Patient is 44 y.o. female who has history of hypertension, GERD comes today complaining of epigastric pain, flank pain low back pain for the last few days, denies any nausea vomiting change in bowel habits, she has not tried any over-the-counter medication has been taking her Prilosec, today her urine dipstick is negative for infection but shows glycosuria, she denies any headache dizziness chest pain shortness of breath denies any blurry vision and dry mouth polyuria or polydipsia. Her hemoglobin A1cchecked today is 9.2%, she does report family history of diabetes.  Past Medical History  Diagnosis Date  . Hypertension     History reviewed. No pertinent past surgical history.    Medication List       This list is accurate as of: 12/20/14  6:07 PM.  Always use your most recent med list.               amLODipine 10 MG tablet  Commonly known as:  NORVASC  Take 1 tablet (10 mg total) by mouth daily.     cyclobenzaprine 10 MG tablet  Commonly known as:  FLEXERIL  Take 1 tablet (10 mg total) by mouth at bedtime.     diphenhydrAMINE 25 MG tablet  Commonly known as:  BENADRYL  Take 1 tablet (25 mg total) by mouth every 6 (six) hours as needed for itching.     glucose monitoring kit monitoring kit  1 each by Does not apply route 4 (four) times daily - after meals and at bedtime. 1 month Diabetic Testing Supplies for QAC-QHS accuchecks.     hydrocortisone 2.5 % cream  Apply topically 2 (two) times daily. Apply to rash areas, do not apply to face     ibuprofen 600 MG tablet  Commonly known as:  ADVIL,MOTRIN  Take 1 tablet (600 mg total) by mouth every 8 (eight) hours as needed.     metFORMIN 500 MG tablet  Commonly known as:  GLUCOPHAGE  Take 1 tablet  (500 mg total) by mouth 2 (two) times daily with a meal.     omeprazole 20 MG capsule  Commonly known as:  PRILOSEC  Take 1 capsule (20 mg total) by mouth daily.     polyethylene glycol powder powder  Commonly known as:  GLYCOLAX/MIRALAX  Take 17 g by mouth 2 (two) times daily as needed.     traMADol 50 MG tablet  Commonly known as:  ULTRAM  Take 1 tablet (50 mg total) by mouth every 8 (eight) hours as needed for moderate pain.        Meds ordered this encounter  Medications  . amLODipine (NORVASC) 10 MG tablet    Sig: Take 1 tablet (10 mg total) by mouth daily.    Dispense:  30 tablet    Refill:  3  . metFORMIN (GLUCOPHAGE) 500 MG tablet    Sig: Take 1 tablet (500 mg total) by mouth 2 (two) times daily with a meal.    Dispense:  180 tablet    Refill:  3  . glucose monitoring kit (FREESTYLE) monitoring kit    Sig: 1 each by Does not apply route 4 (four) times daily - after meals and at bedtime. 1 month Diabetic Testing Supplies for QAC-QHS accuchecks.    Dispense:  1 each    Refill:  1  . cyclobenzaprine (FLEXERIL) 10 MG tablet    Sig: Take 1 tablet (10 mg total) by mouth at bedtime.    Dispense:  30 tablet    Refill:  0    Immunization History  Administered Date(s) Administered  . Influenza Split 06/24/2013  . Influenza,inj,Quad PF,36+ Mos 05/25/2014    Family History  Problem Relation Age of Onset  . Hypertension Mother   . Diabetes Maternal Grandmother     History  Substance Use Topics  . Smoking status: Never Smoker   . Smokeless tobacco: Not on file  . Alcohol Use: No    Review of Systems   As noted in HPI  Filed Vitals:   12/20/14 1704  BP: 126/88  Pulse: 94  Temp: 98 F (36.7 C)  Resp: 16    Physical Exam  Physical Exam  Constitutional: No distress.  Eyes: EOM are normal. Pupils are equal, round, and reactive to light.  Neck: Neck supple.  Cardiovascular: Normal rate and regular rhythm.   Pulmonary/Chest: No respiratory distress. She  has no wheezes. She has no rales.  Abdominal: Soft.  Minimal epigastric tenderness no rebound or guarding  Musculoskeletal: She exhibits no edema.    CBC    Component Value Date/Time   WBC 7.9 01/15/2014 1554   RBC 4.33 01/15/2014 1554   HGB 13.5 01/15/2014 1554   HCT 38.0 01/15/2014 1554   PLT 328 01/15/2014 1554   MCV 87.8 01/15/2014 1554   LYMPHSABS 2.8 01/15/2014 1554   MONOABS 0.5 01/15/2014 1554   EOSABS 0.1 01/15/2014 1554   BASOSABS 0.0 01/15/2014 1554    CMP     Component Value Date/Time   NA 136 10/22/2014 1544   K 5.9* 10/22/2014 1544   CL 100 10/22/2014 1544   CO2 24 10/22/2014 1544   GLUCOSE 254* 10/22/2014 1544   BUN 16 10/22/2014 1544   CREATININE 0.69 10/22/2014 1544   CALCIUM 9.9 10/22/2014 1544   PROT 7.7 10/22/2014 1544   ALBUMIN 4.7 10/22/2014 1544   AST 12 10/22/2014 1544   ALT 13 10/22/2014 1544   ALKPHOS 62 10/22/2014 1544   BILITOT 0.3 10/22/2014 1544   GFRNONAA >89 10/22/2014 1544   GFRAA >89 10/22/2014 1544    Lab Results  Component Value Date/Time   CHOL 185 07/14/2013 05:08 PM    Lab Results  Component Value Date/Time   HGBA1C 9.20 12/20/2014 05:15 PM    Lab Results  Component Value Date/Time   AST 12 10/22/2014 03:44 PM    Assessment and Plan  Flank pain - Plan:  Results for orders placed or performed in visit on 12/20/14  Urinalysis Dipstick  Result Value Ref Range   Color, UA yellow    Clarity, UA clear    Glucose, UA 500    Bilirubin, UA neg    Ketones, UA neg    Spec Grav, UA 1.020    Blood, UA neg    pH, UA 5.5    Protein, UA neg    Urobilinogen, UA 0.2    Nitrite, UA neg    Leukocytes, UA Negative   HgB A1c  Result Value Ref Range   Hemoglobin A1C 9.20    Urinalysis Dipstick Is negative for infection  Type 2 diabetes mellitus without complication - Plan: newly diagnosed, advise patient for diabetes meal in, started patient on metformin, will check , C-peptide, Glutamic acid decarboxylase auto abs,  Anti-islet cell antibody, COMPLETE METABOLIC PANEL  WITH GFR, patient will come back in 2 weeks for nurse visit and bring this CBG log. glucose monitoring kit (FREESTYLE) monitoring kit  Back muscle spasm - Plan: cyclobenzaprine (FLEXERIL) 10 MG tablet  Essential hypertension, benign - Plan: blood pressure is controlled continue with current meds amLODipine (NORVASC) 10 MG tablet, COMPLETE METABOLIC PANEL WITH GFR  Abdominal pain, epigastric - Plan:, Lipase, COMPLETE METABOLIC PANEL WITH GFR, US Abdomen Complete    Return in about 3 months (around 03/22/2015), or if symptoms worsen or fail to improve, for hypertension, CBG check in 2  weeks/Nurse Visit.   This note has been created with Surveyor, quantity. Any transcriptional errors are unintentional.    Lorayne Marek, MD

## 2014-12-20 NOTE — Progress Notes (Signed)
Patient complains of lower flank back pain that started approx six days ago Patient also complains of her abd feeling swollen and having some epigastric pain as well

## 2014-12-20 NOTE — Patient Instructions (Signed)
Diabetes Mellitus and Food It is important for you to manage your blood sugar (glucose) level. Your blood glucose level can be greatly affected by what you eat. Eating healthier foods in the appropriate amounts throughout the day at about the same time each day will help you control your blood glucose level. It can also help slow or prevent worsening of your diabetes mellitus. Healthy eating may even help you improve the level of your blood pressure and reach or maintain a healthy weight.  HOW CAN FOOD AFFECT ME? Carbohydrates Carbohydrates affect your blood glucose level more than any other type of food. Your dietitian will help you determine how many carbohydrates to eat at each meal and teach you how to count carbohydrates. Counting carbohydrates is important to keep your blood glucose at a healthy level, especially if you are using insulin or taking certain medicines for diabetes mellitus. Alcohol Alcohol can cause sudden decreases in blood glucose (hypoglycemia), especially if you use insulin or take certain medicines for diabetes mellitus. Hypoglycemia can be a life-threatening condition. Symptoms of hypoglycemia (sleepiness, dizziness, and disorientation) are similar to symptoms of having too much alcohol.  If your health care provider has given you approval to drink alcohol, do so in moderation and use the following guidelines:  Women should not have more than one drink per day, and men should not have more than two drinks per day. One drink is equal to:  12 oz of beer.  5 oz of wine.  1 oz of hard liquor.  Do not drink on an empty stomach.  Keep yourself hydrated. Have water, diet soda, or unsweetened iced tea.  Regular soda, juice, and other mixers might contain a lot of carbohydrates and should be counted. WHAT FOODS ARE NOT RECOMMENDED? As you make food choices, it is important to remember that all foods are not the same. Some foods have fewer nutrients per serving than other  foods, even though they might have the same number of calories or carbohydrates. It is difficult to get your body what it needs when you eat foods with fewer nutrients. Examples of foods that you should avoid that are high in calories and carbohydrates but low in nutrients include:  Trans fats (most processed foods list trans fats on the Nutrition Facts label).  Regular soda.  Juice.  Candy.  Sweets, such as cake, pie, doughnuts, and cookies.  Fried foods. WHAT FOODS CAN I EAT? Have nutrient-rich foods, which will nourish your body and keep you healthy. The food you should eat also will depend on several factors, including:  The calories you need.  The medicines you take.  Your weight.  Your blood glucose level.  Your blood pressure level.  Your cholesterol level. You also should eat a variety of foods, including:  Protein, such as meat, poultry, fish, tofu, nuts, and seeds (lean animal proteins are best).  Fruits.  Vegetables.  Dairy products, such as milk, cheese, and yogurt (low fat is best).  Breads, grains, pasta, cereal, rice, and beans.  Fats such as olive oil, trans fat-free margarine, canola oil, avocado, and olives. DOES EVERYONE WITH DIABETES MELLITUS HAVE THE SAME MEAL PLAN? Because every person with diabetes mellitus is different, there is not one meal plan that works for everyone. It is very important that you meet with a dietitian who will help you create a meal plan that is just right for you. Document Released: 04/23/2005 Document Revised: 08/01/2013 Document Reviewed: 06/23/2013 ExitCare Patient Information 2015 ExitCare, LLC. This   information is not intended to replace advice given to you by your health care provider. Make sure you discuss any questions you have with your health care provider.  

## 2014-12-21 ENCOUNTER — Other Ambulatory Visit: Payer: Self-pay

## 2014-12-21 LAB — C-PEPTIDE: C-Peptide: 2.82 ng/mL (ref 0.80–3.90)

## 2014-12-21 MED ORDER — GLUCOSE BLOOD VI STRP: ORAL_STRIP | Status: DC

## 2014-12-21 MED ORDER — TRUERESULT BLOOD GLUCOSE W/DEVICE KIT: PACK | Status: DC

## 2014-12-21 MED ORDER — TRUEPLUS LANCETS 28G MISC: Status: DC

## 2014-12-25 LAB — ANTI-ISLET CELL ANTIBODY: Pancreatic Islet Cell Antibody: <5 JDF Units (ref <5)

## 2014-12-31 ENCOUNTER — Ambulatory Visit (HOSPITAL_COMMUNITY)
Admission: RE | Admit: 2014-12-31 | Discharge: 2014-12-31 | Disposition: A | Discharge status: Home / Self Care | Payer: No Typology Code available for payment source | Source: Ambulatory Visit | Attending: Internal Medicine | Admitting: Internal Medicine

## 2014-12-31 DIAGNOSIS — R932 Abnormal findings on diagnostic imaging of liver and biliary tract: Secondary | ICD-10-CM | POA: Insufficient documentation

## 2014-12-31 DIAGNOSIS — R1013 Epigastric pain: Secondary | ICD-10-CM

## 2014-12-31 LAB — GLUTAMIC ACID DECARBOXYLASE AUTO ABS: Glutamic Acid Decarb Ab: <5 IU/mL (ref <5)

## 2015-01-01 ENCOUNTER — Encounter: Payer: Self-pay | Admitting: *Deleted

## 2015-01-01 ENCOUNTER — Other Ambulatory Visit: Payer: Self-pay

## 2015-01-01 ENCOUNTER — Telehealth: Payer: Self-pay | Admitting: Internal Medicine

## 2015-01-01 DIAGNOSIS — R9389 Abnormal findings on diagnostic imaging of other specified body structures: Secondary | ICD-10-CM

## 2015-01-01 NOTE — Telephone Encounter (Signed)
Pt aware of her u;trasound results and the appt June 7 @ 1pm  For her MRI

## 2015-01-01 NOTE — Progress Notes (Unsigned)
Patient ID: Connie Russell, female   DOB: 07-Jan-1971, 44 y.o.   MRN: [id number]  [facility] to alert PCP about abdominal ultrasound findings.  The report suggests patient may require additional studies. Forwarding this message to PCP.

## 2015-01-14 ENCOUNTER — Ambulatory Visit: Payer: No Typology Code available for payment source | Attending: Internal Medicine | Admitting: *Deleted

## 2015-01-14 VITALS — BP 102/69 | HR 99 | Temp 98.3°F | Resp 18 | Wt 150.2 lb

## 2015-01-14 DIAGNOSIS — E119 Type 2 diabetes mellitus without complications: Secondary | ICD-10-CM | POA: Insufficient documentation

## 2015-01-14 DIAGNOSIS — I10 Essential (primary) hypertension: Secondary | ICD-10-CM

## 2015-01-14 DIAGNOSIS — E1165 Type 2 diabetes mellitus with hyperglycemia: Secondary | ICD-10-CM

## 2015-01-14 LAB — GLUCOSE, POCT (MANUAL RESULT ENTRY): POC Glucose: 165 mg/dl — AB (ref 70–99)

## 2015-01-14 NOTE — Patient Instructions (Signed)
Diabetes y cuidados del pie (Diabetes and Foot Care) La diabetes puede ser la causa de que el flujo sanguneo (circulacin) en las piernas y los pies sea deficiente. Debido a esto, la piel de los pies se torna ms delgada, se rompe con facilidad y se cura ms lentamente. La piel puede estar seca, despellejarse y agrietarse. Tambin pueden estar daados los nervios de las piernas y de los pies lo que provoca una disminucin de la sensibilidad. Es posible que no advierta heridas ms pequeas en los pies, que pueden causar infecciones graves. Cuidar sus pies es una de las cosas ms importantes que puede hacer por usted mismo.  INSTRUCCIONES PARA EL CUIDADO EN EL HOGAR  Use siempre calzado, an dentro de su casa. No camine descalzo. Caminar descalzo facilita que se lastime.  Controle sus pies diariamente para observar ampollas, cortes y enrojecimiento. Si no puede ver la planta del pie, use un espejo o pdale ayuda a otra persona.  Lave sus pies con agua tibia (no use agua caliente) y un jabn suave. Seque bien sus pies, y la zona entre los dedos dando palmaditas, hasta que estn completamente secos. Noremoje los pies, ya que esto puede resecar la piel.  Aplique una locin hidratante o vaselina (que no contenga alcohol ni perfume) en los pies y en las uas secas y quebradizas. No aplique locin entre los dedos.  Recorte las uas en forma recta. No escarbe debajo de las uas o alrededor de las cutculas. Lime los bordes de las uas con una lima o esmeril.  No corte las durezas o callosidades, ni trate de quitarlas con medicamentos.  Use calcetines de algodn o medias limpias todos los das. Asegrese de que no le ajusten demasiado. Nouse calcetines que le lleguen a las rodillas, ya que podran disminuir el flujo de sangre a las piernas.  Use zapatos de cuero que le queden bien y que sean acolchados. Para amoldar los zapatos, clcelos slo algunas horas por da. Esto evitar lesiones en los pies. Revise  siempre los zapatos antes de ponerlos para asegurarse de que no haya objetos en su interior.  No cruce las piernas. Esto puede disminuir el flujo de sangre a los pies.  Si algo le ha raspado, cortado o lastimado la piel de los pies, mantenga la piel de esa zona limpia y seca. Debe higienizar estas zonas con agua y un jabn suave. No limpie la zona con agua oxigenada, alcohol ni yodo.  Cuando se quite un vendaje adhesivo, asegrese de no daar la piel.  Si tiene una herida, obsrvela varias veces por da para asegurarse de que se est curando.  No use bolsas de agua caliente ni almohadillas trmicas. Podran causar quemaduras. Si ha perdido la sensibilidad en los pies o las piernas, no sabr lo que le est sucediendo hasta que sea demasiado tarde.  Asegrese de que su mdico le haga un examen completo de los pies por lo menos una vez al ao, o con ms frecuencia si usted tiene problemas en los pies. Informe todos los cortes, llagas o moretones a su mdico inmediatamente. SOLICITE ATENCIN MDICA SI:   Tiene una lesin que no se cura.  Tiene cortes o rajaduras en la piel.  Tiene una ua encarnada.  Nota una zona irritada en las piernas o los pies.  Siente una sensacin de ardor u hormigueo en las piernas o los pies.  Siente dolor o calambres en las piernas o los pies.  Las piernas o los pies estn adormecidos.    Siente los pies siempre fros. SOLICITE ATENCIN MDICA DE INMEDIATO SI:   Presenta enrojecimiento, hinchazn o aumento del dolor en una herida.  Nota una lnea roja que sube por pierna.  Aparece pus en la herida.  Le sube la fiebre o segn lo que le indique el mdico.  Advierte un olor ftido que proviene de una lcera o una herida. Document Released: 07/27/2005 Document Revised: 03/29/2013 [facility] Patient Information 2015 [place]. This information is not intended to replace advice given to you by your health care provider. Make sure you discuss any questions  you have with your health care provider. Diabetes y Kandace Blitz fsica (Diabetes and Exercise) Hacer actividad fsica con regularidad es muy importante. No se trata solo de [employer]. Tiene muchos otros beneficios, como por ejemplo:  Mejorar el estado fsico, la flexibilidad y la resistencia.  Aumenta la densidad sea.  Ayuda a Technical sales engineer.  Disminuye la Air traffic controller.  Aumenta la fuerza muscular.  Reduce el estrs y las tensiones.  Mejora el estado de salud general. Las personas diabticas que realizan actividad fsica tienen beneficios adicionales debido al ejercicio:  Reduce el apetito.  El organismo mejora el uso del azcar (glucosa) de la [place].  Ayuda a disminuir o Product/process development scientist.  Disminuye la presin arterial.  Ayuda a disminuir los lpidos en la sangre (colesterol y triglicridos).  El organismo mejora el uso de la insulina porque:  Aumenta la sensibilidad del organismo a la insulina.  Reduce las necesidades de insulina del organismo.  Disminuye el riesgo de enfermedad cardaca por la actividad fsica ya que  disminuye el colesterol y [employer] triglicridos.  Aumenta los niveles de colesterol bueno (como las lipoprotenas de alta densidad [HDL]) en el organismo.  Disminuye los niveles de glucosa en la [place]. SU PLAN DE ACTIVIDAD  Elija una actividad que disfrute y establezca objetivos realistas. Su mdico o educador en diabetes podrn ayudarlo a encontrar una actividad que lo beneficie. Haga ejercicio regularmente como se lo haya indicado el mdico. Esto incluye:  Hacer entrenamiento de [employer] a la semana, como flexiones, sentadillas, levantar peso o usar bandas de resistencia.  Practicar 155minutos de ejercicios cardiovasculares cada semana, como caminar, correr o hacer algn deporte.  Mantenerse activo y no permanecer inactivo durante ms de 34minutos seguidos. Los perodos cortos de [place] tambin son  beneficiosos. Tres sesiones de 61minutos a lo largo del da son tan beneficiosas como una sola sesin de 76minutos. Estas son algunas ideas para los ejercicios:  Lleve a Probation officer.  Utilice las Clinical cytogeneticist del ascensor.  Baile su cancin favorita.  Haga los ejercicios de un video de ejercicios.  Haga sus ejercicios favoritos con Gaffer. RECOMENDACIONES PARA REALIZAR EJERCICIOS CUANDO SE TIENE DIABETES TIPO 1 O TIPO 2   Controle la glucosa en la sangre antes de comenzar. Si el nivel de glucosa en la sangre es de ms de 240 mg/dl, controle las cetonas en la [place]. No haga actividad fsica si hay cetonas.  Evite inyectarse insulina en las zonas del cuerpo que ejercitar. Por ejemplo, evite inyectarse insulina en:  Los brazos, si juega al tenis.  Las piernas, si corre.  Lleve un registro de:  Los alimentos que consume antes y despus de TEFL teacher.  Los momentos esperables de picos de accin de la insulina.  Los niveles de glucosa en la sangre antes y despus de hacer ejercicios.  El tipo y cantidad de actividad fsica que  realiza.  Revise los registros con su mdico. El mdico lo ayudar a Actor pautas para ajustar la cantidad de alimento y las cantidades de insulina antes y despus de Field seismologist ejercicios.  Si toma insulina o agentes hipoglucemiantes por va oral, observe si hay signos y sntomas de hipoglucemia. Entre los que se incluyen:  Mareos.  Temblores.  Sudoracin.  Escalofros.  Confusin.  Beba gran cantidad de agua mientras hace ejercicios para evitar la deshidratacin o los golpes de Freight forwarder. Durante la actividad fsica se pierde agua corporal que se debe reponer.  Comente con su mdico antes de comenzar un programa de actividad fsica para verificar que sea seguro para usted. Recuerde, cualquier actividad es mejor que ninguna. Document Released: 08/16/2007 Document Revised: 12/11/2013 [facility] Patient Information 2015 [place]. This information is not intended to replace advice given to you by your health care provider. Make sure you discuss any questions you have with your health care provider. La diabetes mellitus y los alimentos (Diabetes Mellitus and Food) Es importante que controle su nivel de azcar en la sangre (glucosa). El nivel de glucosa en sangre depende en gran medida de lo que usted come. Comer alimentos saludables en las cantidades [place] a lo largo del Training and development officer, aproximadamente a la misma hora [employer], lo ayudar a Chief Technology Officer su nivel de Multimedia programmer. Tambin puede ayudarlo a retrasar o Patent attorney de la diabetes mellitus. Comer de [employer] saludable incluso puede ayudarlo a Chartered loss adjuster de presin arterial y a Science writer o Theatre manager un peso saludable.  CMO PUEDEN AFECTARME LOS ALIMENTOS? Carbohidratos Los carbohidratos afectan el nivel de glucosa en sangre ms que cualquier otro tipo de alimento. El nutricionista lo ayudar a Teacher, adult education cuntos carbohidratos puede consumir en cada comida y ensearle a contarlos. El recuento de carbohidratos es importante para mantener la glucosa en sangre en un nivel saludable, en especial si utiliza insulina o toma determinados medicamentos para la diabetes mellitus. Alcohol El alcohol puede provocar disminuciones sbitas de la glucosa en sangre (hipoglucemia), en especial si utiliza insulina o toma determinados medicamentos para la diabetes mellitus. La hipoglucemia es una afeccin que puede poner en peligro la vida. Los sntomas de la hipoglucemia (somnolencia, mareos y Data processing manager) son similares a los sntomas de haber consumido mucho alcohol.  Si el mdico lo autoriza a beber alcohol, hgalo con moderacin y siga estas pautas:  Las mujeres no deben beber ms de un trago por da, y los hombres no deben beber ms de dos tragos por Training and development officer. Un trago es igual a:  12 onzas (355 ml) de cerveza  5 onzas de vino (150 ml) de vino  1,5onzas (75ml) de  bebidas espirituosas  No beba con el estmago vaco.  Mantngase hidratado. Beba agua, gaseosas dietticas o t helado sin azcar.  Las gaseosas comunes, los jugos y otros refrescos podran contener muchos carbohidratos y se Civil Service fast streamer. QU ALIMENTOS NO SE RECOMIENDAN? Cuando haga las elecciones de alimentos, es importante que recuerde que todos los alimentos son distintos. Algunos tienen menos nutrientes que otros por porcin, aunque podran tener la misma cantidad de caloras o carbohidratos. Es difcil darle al cuerpo lo que necesita cuando consume alimentos con menos nutrientes. Estos son algunos ejemplos de alimentos que debera evitar ya que contienen muchas caloras y carbohidratos, pero pocos nutrientes:  Physicist, medical trans (la mayora de los alimentos procesados incluyen grasas trans en la etiqueta de Informacin nutricional).  Gaseosas comunes.  Jugos.  Caramelos.  Dulces, como tortas, pasteles, rosquillas y  galletas.  Comidas fritas. QU ALIMENTOS PUEDO COMER? Consuma alimentos ricos en nutrientes, que nutrirn el cuerpo y lo mantendrn saludable. Los alimentos que debe comer tambin dependern de varios factores, como:  Las caloras que necesita.  Los medicamentos que toma.  Su peso.  El nivel de glucosa en [place].  El [place] de presin arterial.  El nivel de colesterol. Tambin debe consumir una variedad de [place], como:  Protenas, como carne, aves, pescado, tofu, frutos secos y semillas (las protenas de [place] magros son mejores).  Lambert Mody.  Verduras.  Productos lcteos, como [place], queso y yogur (descremados son mejores).  Panes, granos, pastas, cereales, arroz y frijoles.  Grasas, como aceite de [place], [place] sin grasas trans, aceite de canola, aguacate y [place]. TODOS LOS QUE PADECEN DIABETES MELLITUS TIENEN EL [place] PLAN DE [place]? Dado que todas las personas que padecen diabetes mellitus son distintas, no hay un solo plan de comidas que  funcione para todos. Es muy importante que se rena con un nutricionista que lo ayudar a crear un plan de comidas adecuado para usted. Document Released: 11/03/2007 Document Revised: 08/01/2013 [facility] Patient Information 2015 [place]. This information is not intended to replace advice given to you by your health care provider. Make sure you discuss any questions you have with your health care provider. Recuento bsico de carbohidratos para la diabetes mellitus (Basic Carbohydrate Counting for Diabetes Mellitus) El recuento de carbohidratos es un mtodo destinado a calcular la cantidad de carbohidratos en la dieta. El consumo de carbohidratos aumenta naturalmente el nivel de azcar (glucosa) en la sangre, por lo que es importante que sepa la cantidad que debe incluir en cada comida. El recuento de carbohidratos ayuda a Advertising account executive de glucosa en la sangre dentro de los lmites normales. La cantidad permitida de carbohidratos es diferente para cada persona. Un nutricionista puede ayudarlo a calcular la cantidad adecuada para usted. Una vez que sepa la cantidad de carbohidratos que puede consumir, podr calcular los carbohidratos de los alimentos que desea comer. Los siguientes alimentos incluyen carbohidratos:  Granos, como panes y cereales.  Frijoles secos y productos con soja.  Vegetales almidonados, como papas, guisantes y maz.  Lambert Mody y jugos de frutas.  Leche y Estate agent.  Dulces y bocadillos, como pastel, galletas, caramelos, papas fritas de bolsa, refrescos y bebidas frutales con azcar. RECUENTO DE CARBOHIDRATOS [employer] de calcular los carbohidratos de los alimentos. Puede usar cualquiera de los dos mtodos o [place] combinacin de [place]. Leer la etiqueta de informacin nutricional de los alimentos envasados La informacin nutricional es una etiqueta incluida en casi todas las bebidas y los alimentos envasados de los [place]. Indica el tamao de la porcin de ese  alimento o bebida e informacin sobre los nutrientes de cada porcin, incluso los gramos (g) de carbohidratos por porcin.  Decida la cantidad de porciones que comer o tomar de este alimento o bebida. Multiplique la cantidad de porciones por el nmero de gramos de carbohidratos indicados en la etiqueta para esa porcin. El total ser la cantidad de carbohidratos que consumir al comer ese alimento o tomar esa bebida. Conocer las porciones estndar de los alimentos Cuando coma alimentos no envasados o que no incluyan la informacin nutricional en la etiqueta, deber medir las porciones para poder calcular la cantidad de carbohidratos. Una porcin de la mayora de los alimentos ricos en carbohidratos contiene alrededor de 15g de carbohidratos. La siguiente [employer] tamaos de porcin de los alimentos ricos en carbohidratos que contienen  alrededor de 15g de carbohidratos por porcin:   1rebanada de pan (1oz) o 1tortilla de seis pulgadas.  panecillo de hamburguesa o bollito tipo ingls.  4a 6galletas.   de taza de cereal sin azcar y seco.   taza de cereal caliente.   de taza de arroz o pastas.  taza de pur de papas o de una papa grande al horno.  1taza de frutas frescas o una fruta pequea.  taza de frutas o jugo de frutas enlatados o congelados.  1 taza AutoZonede leche.   de taza de yogur descremado sin ningn agregado o de yogur endulzado con edulcorante artificial.  taza de vegetales almidonados, como guisantes, maz o papas, o de frijoles secos cocidos. Decida la cantidad de porciones Advertising copywriterestndar que comer. Multiplique la cantidad de porciones por 15 (los gramos de carbohidratos en esa porcin). Por ejemplo, si come 2tazas de fresas, habr comido 2porciones y 30g de carbohidratos (2porciones x 15g = 30g). Para las comidas como sopas y guisos, en las que se mezcla ms de un alimento, deber [place] Fecontar los carbohidratos de cada alimento incluido. EJEMPLO DE  RECUENTO DE CARBOHIDRATOS Ejemplo de cena  3 onzas de pechugas de pollo.   de taza de arroz integral.   taza de maz.  1 taza de [place].  1 taza de fresas con crema batida sin azcar. Clculo de carbohidratos [place] 1: Identifique los alimentos que contienen carbohidratos:   Arroz.  Maz.  Leche.  Jinny SandersFresas. [place] 2: Calcule el nmero de porciones que consumir de cada uno:   2 porciones de Surveyor, mineralsarroz.  1 porcin de maz.  1 porcin de leche.  1 porcin de fresas. [place] 3: Multiplique cada una de esas porciones por 15g:   2 porciones de arroz x 15 g = 30 g.  1 porcin de maz x 15 g = 15 g.  1 porcin de leche x 15 g = 15 g.  1 porcin de fresas x 15 g = 15 g. [place] 4: Sume todas las cantidades para Artistconocer el total de gramos de carbohidratos consumidos: 30 g + 15 g + 15 g + 15 g = 75 g. Document Released: 10/19/2011 Document Revised: 12/11/2013 [facility] Patient Information 2015 [place]. This information is not intended to replace advice given to you by your health care provider. Make sure you discuss any questions you have with your health care provider.

## 2015-01-14 NOTE — Progress Notes (Signed)
Spoke with patient via [employer], [place] and [employer], [place], [place] [phone] and [place], [place] [phone]  Patient presents for BP check, CBG and record review for T2DM Med list reviewed; patient reports taking all meds as directed Patient's AM fasting blood sugars ranging 98-168 Patient was also checking BS immeditely after lunch and dinner Instructed pt to check BS before lunch and before dinner or wait at least 2 hours after meal Denies increased thirst and urination States feeling well  CBG 165   Lab Results  Component Value Date   HGBA1C 9.20 12/20/2014   Filed Vitals:   01/14/15 1639  BP: 102/69  Pulse: 99  Temp: 98.3 F (36.8 C)  Resp: 18    Per PCP: Continue meds as is F/u with PCP in 3 months from last visit; due around 03/26/15 with Blood Sugar log  Patient advised to call for med refills at least 7 days before running out so as not to go without.  Patient given literature on Diabetes and Food, Diabetes and Exercise, Basic Carb Counting, and Diabetes and Foot Care.  Discussed The Plate Method and choosing low carb options

## 2015-01-15 ENCOUNTER — Telehealth: Payer: Self-pay | Admitting: Internal Medicine

## 2015-01-15 ENCOUNTER — Ambulatory Visit (HOSPITAL_COMMUNITY)
Admission: RE | Admit: 2015-01-15 | Discharge: 2015-01-15 | Disposition: A | Discharge status: Home / Self Care | Payer: No Typology Code available for payment source | Source: Ambulatory Visit | Attending: Internal Medicine | Admitting: Internal Medicine

## 2015-01-15 DIAGNOSIS — R9389 Abnormal findings on diagnostic imaging of other specified body structures: Secondary | ICD-10-CM

## 2015-01-15 NOTE — Telephone Encounter (Signed)
Patient came into office requesting to speak to nurse, patient was scheduled for MRI today but they were unable to perform it because they want patient to have a pregnancy test done before it can be done. Please f/u with patient

## 2015-01-18 NOTE — Telephone Encounter (Signed)
Forwarder to [employer]

## 2015-01-29 ENCOUNTER — Ambulatory Visit: Payer: No Typology Code available for payment source | Attending: Internal Medicine | Admitting: Pharmacist

## 2015-01-29 DIAGNOSIS — R109 Unspecified abdominal pain: Secondary | ICD-10-CM

## 2015-01-29 LAB — POCT URINE PREGNANCY: Preg Test, Ur: NEGATIVE

## 2015-01-29 NOTE — Progress Notes (Unsigned)
Patient was scheduled for an MRI of her ABD but was not able to have it until  We perfomed a urine pregnancy test Pregnancy test was negative New appointment scheduled for 7/1 @ 3pm

## 2015-02-08 ENCOUNTER — Ambulatory Visit (HOSPITAL_COMMUNITY): Payer: No Typology Code available for payment source

## 2015-02-14 ENCOUNTER — Ambulatory Visit (HOSPITAL_COMMUNITY)
Admission: RE | Admit: 2015-02-14 | Discharge: 2015-02-14 | Disposition: A | Discharge status: Home / Self Care | Payer: No Typology Code available for payment source | Source: Ambulatory Visit | Attending: Internal Medicine | Admitting: Internal Medicine

## 2015-02-14 DIAGNOSIS — R1013 Epigastric pain: Secondary | ICD-10-CM | POA: Insufficient documentation

## 2015-02-14 DIAGNOSIS — R1030 Lower abdominal pain, unspecified: Secondary | ICD-10-CM | POA: Insufficient documentation

## 2015-02-14 LAB — CREATININE, SERUM
CREATININE: 0.63 mg/dL (ref 0.44–1.00)
GFR calc Af Amer: >60 mL/min (ref >60)

## 2015-02-14 MED ORDER — GADOBENATE DIMEGLUMINE 529 MG/ML IV SOLN
20.0000 mL | Freq: Once | INTRAVENOUS | Status: AC | PRN
  Administered 2015-02-14: 14 mL via INTRAVENOUS

## 2015-02-15 ENCOUNTER — Telehealth: Payer: Self-pay

## 2015-02-15 NOTE — Telephone Encounter (Signed)
-----   Message from Doris Cheadleeepak Advani, MD sent at 02/15/2015 12:08 PM EDT ----- Call and let  patient know that her MRI of abdomen is normal.  IMPRESSION: 1. No finding within the liver to correspond to the ultrasound abnormality. This was likely artifactual. 2. Possible constipation. 3. Mildly motion degraded exam.

## 2015-02-15 NOTE — Telephone Encounter (Signed)
Interpreter line used Connie Russell [phone]#[id number] Patient not available Message left on voice mail to return our call

## 2015-03-05 ENCOUNTER — Encounter: Payer: Self-pay | Admitting: Internal Medicine

## 2015-03-05 ENCOUNTER — Ambulatory Visit: Payer: No Typology Code available for payment source | Attending: Internal Medicine | Admitting: Internal Medicine

## 2015-03-05 VITALS — BP 119/79 | HR 86 | Temp 98.0°F | Resp 16 | Wt 141.4 lb

## 2015-03-05 DIAGNOSIS — I10 Essential (primary) hypertension: Secondary | ICD-10-CM | POA: Insufficient documentation

## 2015-03-05 DIAGNOSIS — Z79899 Other long term (current) drug therapy: Secondary | ICD-10-CM | POA: Insufficient documentation

## 2015-03-05 DIAGNOSIS — E119 Type 2 diabetes mellitus without complications: Secondary | ICD-10-CM | POA: Insufficient documentation

## 2015-03-05 DIAGNOSIS — E139 Other specified diabetes mellitus without complications: Secondary | ICD-10-CM

## 2015-03-05 LAB — GLUCOSE, POCT (MANUAL RESULT ENTRY): POC GLUCOSE: 152 mg/dL — AB (ref 70–99)

## 2015-03-05 MED ORDER — METFORMIN HCL 500 MG PO TABS: 500.0000 mg | ORAL_TABLET | Freq: Two times a day (BID) | ORAL | Status: DC

## 2015-03-05 MED ORDER — AMLODIPINE BESYLATE 10 MG PO TABS: 10.0000 mg | ORAL_TABLET | Freq: Every day | ORAL | Status: DC

## 2015-03-05 NOTE — Progress Notes (Signed)
Patient here for follow up on her diabetes and for medication refills 

## 2015-03-05 NOTE — Progress Notes (Signed)
MRN: [id number] Name: Connie Russell  Sex: female Age: 44 y.o. DOB: [birth year]  Allergies: Review of patient's allergies indicates no known allergies.  Chief Complaint  Patient presents with  . Follow-up    HPI: Patient is 44 y.o. female who has to of diabetes, hypertension comes today for followup, she brought the fingerstick log, her fasting blood sugar usually between 90-1 30 mg/dL, she is not due for repeat A1c, patient has been trying to modify her diet, today she is requesting refill on her medications, denies any headache dizziness chest and shortness of breath.  Past Medical History  Diagnosis Date  . Hypertension     History reviewed. No pertinent past surgical history.    Medication List       This list is accurate as of: 03/05/15  5:19 PM.  Always use your most recent med list.               amLODipine 10 MG tablet  Commonly known as:  NORVASC  Take 1 tablet (10 mg total) by mouth daily.     cyclobenzaprine 10 MG tablet  Commonly known as:  FLEXERIL  Take 1 tablet (10 mg total) by mouth at bedtime.     diphenhydrAMINE 25 MG tablet  Commonly known as:  BENADRYL  Take 1 tablet (25 mg total) by mouth every 6 (six) hours as needed for itching.     glucose blood test strip  Check blood sugar TID & QHS     glucose blood test strip  Commonly known as:  TRUETEST TEST  Check blood sugar TID & QHS     glucose monitoring kit monitoring kit  1 each by Does not apply route 4 (four) times daily - after meals and at bedtime. 1 month Diabetic Testing Supplies for QAC-QHS accuchecks.     TRUERESULT BLOOD GLUCOSE W/DEVICE Kit  Check blood sugar TID & QHS     hydrocortisone 2.5 % cream  Apply topically 2 (two) times daily. Apply to rash areas, do not apply to face     ibuprofen 600 MG tablet  Commonly known as:  ADVIL,MOTRIN  Take 1 tablet (600 mg total) by mouth every 8 (eight) hours as needed.     metFORMIN 500 MG tablet  Commonly known as:   GLUCOPHAGE  Take 1 tablet (500 mg total) by mouth 2 (two) times daily with a meal.     omeprazole 20 MG capsule  Commonly known as:  PRILOSEC  Take 1 capsule (20 mg total) by mouth daily.     polyethylene glycol powder powder  Commonly known as:  GLYCOLAX/MIRALAX  Take 17 g by mouth 2 (two) times daily as needed.     traMADol 50 MG tablet  Commonly known as:  ULTRAM  Take 1 tablet (50 mg total) by mouth every 8 (eight) hours as needed for moderate pain.     TRUEPLUS LANCETS 28G Misc  Check blood sugar TID & QHS        Meds ordered this encounter  Medications  . amLODipine (NORVASC) 10 MG tablet    Sig: Take 1 tablet (10 mg total) by mouth daily.    Dispense:  30 tablet    Refill:  3  . metFORMIN (GLUCOPHAGE) 500 MG tablet    Sig: Take 1 tablet (500 mg total) by mouth 2 (two) times daily with a meal.    Dispense:  180 tablet    Refill:  3    Immunization  History  Administered Date(s) Administered  . Influenza Split 06/24/2013  . Influenza,inj,Quad PF,36+ Mos 05/25/2014    Family History  Problem Relation Age of Onset  . Hypertension Mother   . Diabetes Maternal Grandmother     History  Substance Use Topics  . Smoking status: Never Smoker   . Smokeless tobacco: Not on file  . Alcohol Use: No    Review of Systems   As noted in HPI  Filed Vitals:   03/05/15 1631  BP: 119/79  Pulse: 86  Temp: 98 F (36.7 C)  Resp: 16    Physical Exam  Physical Exam  Constitutional: No distress.  Eyes: EOM are normal. Pupils are equal, round, and reactive to light.  Cardiovascular: Normal rate and regular rhythm.   Pulmonary/Chest: Breath sounds normal. No respiratory distress. She has no wheezes. She has no rales.  Abdominal: Soft. There is no tenderness.  Musculoskeletal: She exhibits no edema.    Labs   Lab Results  Component Value Date   WBC 7.9 01/15/2014   HGB 13.5 01/15/2014   HCT 38.0 01/15/2014   PLT 328 01/15/2014   GLUCOSE 198* 12/20/2014    CHOL 185 07/14/2013   TRIG 180* 07/14/2013   HDL 39* 07/14/2013   LDLCALC 110* 07/14/2013   ALT 47* 12/20/2014   AST 24 12/20/2014   NA 135 12/20/2014   K 4.0 12/20/2014   CL 99 12/20/2014   CREATININE 0.63 02/14/2015   BUN 13 12/20/2014   CO2 27 12/20/2014   TSH 0.911 07/14/2013   HGBA1C 9.20 12/20/2014    Lab Results  Component Value Date   HGBA1C 9.20 12/20/2014   HGBA1C 5.8 02/15/2014   HGBA1C 6.2 08/14/2013     Assessment and Plan  Other specified diabetes mellitus without complications - Plan:  Results for orders placed or performed in visit on 03/05/15  Glucose (CBG)  Result Value Ref Range   POC Glucose 152 (A) 70 - 99 mg/dl   Currently patient is on metformin 500 mg twice a day, continue with diet modification, repeat A1c on the following visit.  Essential hypertension, benign - Plan: blood pressure is well controlled, continue with amLODipine (NORVASC) 10 MG tablet   Fasting blood work including lipid panel on the following visit  Return in about 3 months (around 06/05/2015), or if symptoms worsen or fail to improve, for diabetes.   This note has been created with Surveyor, quantity. Any transcriptional errors are unintentional.    Lorayne Marek, MD

## 2015-03-05 NOTE — Patient Instructions (Signed)
Plan de alimentacin DASH (DASH Eating Plan) DASH es la sigla en ingls de "Enfoques Alimentarios para Detener la Hipertensin". El plan de alimentacin DASH ha demostrado bajar la presin arterial elevada (hipertensin). Los beneficios adicionales para la salud pueden incluir la disminucin del riesgo de diabetes mellitus tipo2, enfermedades cardacas e ictus. Este plan tambin puede ayudar a adelgazar. QU DEBO SABER ACERCA DEL PLAN DE ALIMENTACIN DASH? Para el plan de alimentacin DASH, seguir las siguientes pautas generales:  Elija los alimentos con un valor porcentual diario de sodio de menos del 5% (segn figura en la etiqueta del alimento).  Use hierbas o aderezos sin sal, en lugar de sal de mesa o sal marina.  Consulte al mdico o farmacutico antes de usar sustitutos de la sal.  Coma productos con bajo contenido de sodio, cuya etiqueta suele decir "bajo contenido de sodio" o "sin agregado de sal".  Coma alimentos frescos.  Coma ms verduras, frutas y productos lcteos con bajo contenido de grasas.  Elija los cereales integrales. Busque la palabra "integral" en el primer lugar de la lista de ingredientes.  Elija el pescado y el pollo o el pavo sin piel ms a menudo que las carnes rojas. Limite el consumo de pescado, carne de ave y carne a 6onzas (170g) por da.  Limite el consumo de dulces, postres, azcares y bebidas azucaradas.  Elija las grasas saludables para el corazn.  Limite el consumo de queso a 1onza (28g) por da.  Consuma ms comida casera y menos de restaurante, de buf y comida rpida.  Limite el consumo de alimentos fritos.  Cocine los alimentos utilizando mtodos que no sean la fritura.  Limite las verduras enlatadas. Si las consume, enjuguelas bien para disminuir el sodio.  Cuando coma en un restaurante, pida que preparen su comida con menos sal o, en lo posible, sin nada de sal. QU ALIMENTOS PUEDO COMER? Pida ayuda a un nutricionista para  conocer las necesidades calricas individuales. Cereales Pan de salvado o integral. Arroz integral. Pastas de salvado o integrales. Quinua, trigo burgol y cereales integrales. Cereales con bajo contenido de sodio. Tortillas de harina de maz o de salvado. Pan de maz integral. Galletas saladas integrales. Galletas con bajo contenido de sodio. Vegetales Verduras frescas o congeladas (crudas, al vapor, asadas o grilladas). Jugos de tomate y verduras con contenido bajo o reducido de sodio. Pasta y salsa de tomate con contenido bajo o reducido de sodio. Verduras enlatadas con bajo contenido de sodio o reducido de sodio.  Frutas Frutas frescas, en conserva (en su jugo natural) o frutas congeladas. Carnes y otros productos con protenas Carne de res molida (al 85% o ms magra), carne de res de animales alimentados con pastos o carne de res sin la grasa. Pollo o pavo sin piel. Carne de pollo o de pavo molida. Cerdo sin la grasa. Todos los pescados y frutos de mar. Huevos. Porotos, guisantes o lentejas secos. Frutos secos y semillas sin sal. Frijoles enlatados sin sal. Lcteos Productos lcteos con bajo contenido de grasas, como leche descremada o al 1%, quesos reducidos en grasas o al 2%, ricota con bajo contenido de grasas o queso cottage, o yogur natural con bajo contenido de grasas. Quesos con contenido bajo o reducido de sodio. Grasas y aceites Margarinas en barra que no contengan grasas trans. Mayonesa y alios para ensaladas livianos o reducidos en grasas (reducidos en sodio). [place]ac[place]. Aceites de crtamo, oliva o canola. Mantequilla natural de man o almendra. Otros Palomitas de maz y pretzels sin sal.   Los artculos mencionados arriba pueden no ser una lista completa de las bebidas o los alimentos recomendados. Comunquese con el nutricionista para conocer ms opciones. QU ALIMENTOS NO SE RECOMIENDAN? Cereales Pan blanco. Pastas blancas. Arroz blanco. Pan de maz refinado. Bagels y  croissants. Galletas saladas que contengan grasas trans. Vegetales Vegetales con crema o fritos. Verduras en salsa de queso. Verduras enlatadas comunes. Pasta y salsa de tomate en lata comunes. Jugos comunes de tomate y de verduras. Frutas Frutas secas. Fruta enlatada en almbar liviano o espeso. Jugo de frutas. Carnes y otros productos con protenas Cortes de carne con grasa. Costillas, alas de pollo, tocineta, salchicha, mortadela, salame, chinchulines, tocino, perros calientes, salchichas alemanas y embutidos envasados. Frutos secos y semillas con sal. Frijoles con sal en lata. Lcteos Leche entera o al 2%, crema, mezcla de leche y crema, y queso crema. Yogur entero o endulzado. Quesos o queso azul con alto contenido de grasas. Cremas no lcteas y coberturas batidas. Quesos procesados, quesos para untar o cuajadas. Condimentos Sal de cebolla y ajo, sal condimentada, sal de mesa y sal marina. Salsas en lata y envasadas. Salsa Worcester[place]. Salsa trtara. Salsa barbacoa. Salsa teriyaki. Salsa de soja, incluso la que tiene contenido reducido de sodio. Salsa de carne. Salsa de pescado. Salsa de ostras. Salsa rosada. Rbano picante. Ketchup y mostaza. Saborizantes y tiernizantes para carne. Caldo en cubitos. Salsa picante. Salsa tabasco. Adobos. Aderezos para tacos. Salsas. Grasas y aceites Mantequilla, margarina en barra, manteca de cerdo, grasa, mantequilla clarificada y grasa de tocino. Aceites de coco, de palmiste o de palma. Aderezos comunes para ensalada. Otros Pickles y aceitunas. Palomitas de maz y pretzels con sal. Los artculos mencionados arriba pueden no ser una lista completa de las bebidas y los alimentos que se deben evitar. Comunquese con el nutricionista para obtener ms informacin. DNDE PUEDO ENCONTRAR MS INFORMACIN? Instit[employer] Nacional del Corazn, del Pulmn y de la Sangre (National Heart, Lung, and Blood Institute):  www.nhlbi.nih.gov/health/health-topics/topics/dash/ Document Released: 07/16/2011 Document Revised: 12/11/2013 ExitCare Patient Information 2015 [employer]. This information is not intended to replace advice given to you by your health care provider. Make sure you discuss any questions you have with your health care provider. La diabetes mellitus y los alimentos (Diabetes Mellitus and Food) Es importante que controle su nivel de azcar en la sangre (glucosa). El nivel de glucosa en sangre depende en gran medida de lo que usted come. Comer alimentos saludables en las cantidades adecuadas a lo largo del da, aproximadamente a la misma hora todos los das, lo ayudar a controlar su nivel de glucosa en sangre. Tambin puede ayudarlo a retrasar o evitar el empeoramiento de la diabetes mellitus. Comer de manera saludable incluso puede ayudarlo a mejorar el nivel de presin arterial y a alcanzar o mantener un peso saludable.  CMO PUEDEN AFECTARME LOS ALIMENTOS? Carbohidratos Los carbohidratos afectan el nivel de glucosa en sangre ms que cualquier otro tipo de alimento. El nutricionista lo ayudar a determinar cuntos carbohidratos puede consumir en cada comida y ensearle a contarlos. El recuento de carbohidratos es importante para mantener la glucosa en sangre en un nivel saludable, en especial si utiliza insulina o toma determinados medicamentos para la diabetes mellitus. Alcohol El alcohol puede provocar disminuciones sbitas de la glucosa en sangre (hipoglucemia), en especial si utiliza insulina o toma determinados medicamentos para la diabetes mellitus. La hipoglucemia es una afeccin que puede poner en peligro la vida. Los sntomas de la hipoglucemia (somnolencia, mareos y desorientacin) son similares a los sntomas de   haber consumido mucho alcohol.  Si el mdico lo autoriza a beber alcohol, hgalo con moderacin y siga estas pautas:  Las mujeres no deben beber ms de un trago por da, y los  hombres no deben beber ms de dos tragos por da. Un trago es igual a:  12 onzas (355 ml) de cerveza  5 onzas de vino (150 ml) de vino  1,5onzas (45ml) de bebidas espirituosas  No beba con el estmago vaco.  Mantngase hidratado. Beba agua, gaseosas dietticas o t helado sin azcar.  Las gaseosas comunes, los jugos y otros refrescos podran contener muchos carbohidratos y se deben contar. QU ALIMENTOS NO SE RECOMIENDAN? Cuando haga las elecciones de alimentos, es importante que recuerde que todos los alimentos son distintos. Algunos tienen menos nutrientes que otros por porcin, aunque podran tener la misma cantidad de caloras o carbohidratos. Es difcil darle al cuerpo lo que necesita cuando consume alimentos con menos nutrientes. Estos son algunos ejemplos de alimentos que debera evitar ya que contienen muchas caloras y carbohidratos, pero pocos nutrientes:  Grasas trans (la mayora de los alimentos procesados incluyen grasas trans en la etiqueta de Informacin nutricional).  Gaseosas comunes.  Jugos.  Caramelos.  Dulces, como tortas, pasteles, rosquillas y galletas.  Comidas fritas. QU ALIMENTOS PUEDO COMER? Consuma alimentos ricos en nutrientes, que nutrirn el cuerpo y lo mantendrn saludable. Los alimentos que debe comer tambin dependern de varios factores, como:  Las caloras que necesita.  Los medicamentos que toma.  Su peso.  El nivel de glucosa en sangre.  El nivel de presin arterial.  El nivel de colesterol. Tambin debe consumir una variedad de alimentos, como:  Protenas, como carne, aves, pescado, tofu, frutos secos y semillas (las protenas de animales magros son mejores).  Frutas.  Verduras.  Productos lcteos, como leche, queso y yogur (descremados son mejores).  Panes, granos, pastas, cereales, arroz y frijoles.  Grasas, como aceite de oliva, margarina sin grasas trans, aceite de canola, aguacate y aceitunas. TODOS LOS QUE  PADECEN DIABETES MELLITUS TIENEN EL MISMO PLAN DE COMIDAS? Dado que todas las personas que padecen diabetes mellitus son distintas, no hay un solo plan de comidas que funcione para todos. Es muy importante que se rena con un nutricionista que lo ayudar a crear un plan de comidas adecuado para usted. Document Released: 11/03/2007 Document Revised: 08/01/2013 ExitCare Patient Information 2015 [employer]. This information is not intended to replace advice given to you by your health care provider. Make sure you discuss any questions you have with your health care provider.  

## 2015-04-08 ENCOUNTER — Other Ambulatory Visit: Payer: Self-pay

## 2015-04-08 DIAGNOSIS — Z1231 Encounter for screening mammogram for malignant neoplasm of breast: Secondary | ICD-10-CM

## 2015-04-12 ENCOUNTER — Telehealth: Payer: Self-pay | Admitting: Internal Medicine

## 2015-04-12 ENCOUNTER — Ambulatory Visit: Payer: No Typology Code available for payment source | Attending: Family Medicine

## 2015-04-12 DIAGNOSIS — Z0000 Encounter for general adult medical examination without abnormal findings: Secondary | ICD-10-CM

## 2015-04-12 NOTE — Telephone Encounter (Signed)
Pt. Is requesting referral for dental issues....please call patient

## 2015-05-14 ENCOUNTER — Ambulatory Visit
Admission: RE | Admit: 2015-05-14 | Discharge: 2015-05-14 | Disposition: A | Discharge status: Home / Self Care | Payer: No Typology Code available for payment source | Source: Ambulatory Visit

## 2015-05-14 DIAGNOSIS — Z1231 Encounter for screening mammogram for malignant neoplasm of breast: Secondary | ICD-10-CM

## 2015-05-22 ENCOUNTER — Encounter: Payer: Self-pay | Admitting: Internal Medicine

## 2015-05-22 ENCOUNTER — Ambulatory Visit: Payer: Self-pay | Attending: Internal Medicine | Admitting: Internal Medicine

## 2015-05-22 VITALS — BP 119/81 | HR 83 | Temp 98.0°F | Resp 16 | Ht 63.0 in | Wt 141.4 lb

## 2015-05-22 DIAGNOSIS — K219 Gastro-esophageal reflux disease without esophagitis: Secondary | ICD-10-CM | POA: Insufficient documentation

## 2015-05-22 DIAGNOSIS — Z8249 Family history of ischemic heart disease and other diseases of the circulatory system: Secondary | ICD-10-CM | POA: Insufficient documentation

## 2015-05-22 DIAGNOSIS — E119 Type 2 diabetes mellitus without complications: Secondary | ICD-10-CM | POA: Insufficient documentation

## 2015-05-22 DIAGNOSIS — I10 Essential (primary) hypertension: Secondary | ICD-10-CM | POA: Insufficient documentation

## 2015-05-22 DIAGNOSIS — Z79899 Other long term (current) drug therapy: Secondary | ICD-10-CM | POA: Insufficient documentation

## 2015-05-22 DIAGNOSIS — M79645 Pain in left finger(s): Secondary | ICD-10-CM | POA: Insufficient documentation

## 2015-05-22 DIAGNOSIS — Z7984 Long term (current) use of oral hypoglycemic drugs: Secondary | ICD-10-CM | POA: Insufficient documentation

## 2015-05-22 DIAGNOSIS — Z23 Encounter for immunization: Secondary | ICD-10-CM | POA: Insufficient documentation

## 2015-05-22 DIAGNOSIS — R1033 Periumbilical pain: Secondary | ICD-10-CM | POA: Insufficient documentation

## 2015-05-22 LAB — GLUCOSE, POCT (MANUAL RESULT ENTRY): POC GLUCOSE: 160 mg/dL — AB (ref 70–99)

## 2015-05-22 LAB — POCT GLYCOSYLATED HEMOGLOBIN (HGB A1C): Hemoglobin A1C: 6.7

## 2015-05-22 MED ORDER — IBUPROFEN 600 MG PO TABS: 600.0000 mg | ORAL_TABLET | Freq: Three times a day (TID) | ORAL | Status: DC | PRN

## 2015-05-22 MED ORDER — METFORMIN HCL 500 MG PO TABS: 500.0000 mg | ORAL_TABLET | Freq: Two times a day (BID) | ORAL | Status: DC

## 2015-05-22 MED ORDER — OMEPRAZOLE 20 MG PO CPDR: 20.0000 mg | DELAYED_RELEASE_CAPSULE | Freq: Every day | ORAL | Status: DC

## 2015-05-22 NOTE — Progress Notes (Signed)
Patient ID: Connie Russell, female   DOB: 1971/01/13, 44 y.o.   MRN: [id number]  CC: follow up   HPI: Connie Russell is a 44 y.o. female here today for a follow up visit.  Patient has past medical history of DM and HTN. Patient complains of having a lump and pain above her navel that she first noticed several years ago. She states that recently every time she eats she gets that pain. Pain described as a burning pain that is worse with palpitation. No associated nausea or vomiting. Abdomen feels "swollen".  Patient also complains of left middle finger pain and swelling for the past 5 days. She denies any injury or family history of rheumatoid arthritis.  Patient reports that she takes her diabetes medication daily without complications. She checks her sugars sporadically and denies any hypoglycemic events.  She denies blurred vision, polyuria, polydipsia, neuropathy.   No Known Allergies Past Medical History  Diagnosis Date  . Hypertension    Current Outpatient Prescriptions on File Prior to Visit  Medication Sig Dispense Refill  . amLODipine (NORVASC) 10 MG tablet Take 1 tablet (10 mg total) by mouth daily. 30 tablet 3  . ibuprofen (ADVIL,MOTRIN) 600 MG tablet Take 1 tablet (600 mg total) by mouth every 8 (eight) hours as needed. 30 tablet 3  . metFORMIN (GLUCOPHAGE) 500 MG tablet Take 1 tablet (500 mg total) by mouth 2 (two) times daily with a meal. 180 tablet 3  . omeprazole (PRILOSEC) 20 MG capsule Take 1 capsule (20 mg total) by mouth daily. 30 capsule 3  . Blood Glucose Monitoring Suppl (TRUERESULT BLOOD GLUCOSE) W/DEVICE KIT Check blood sugar TID & QHS 1 each 0  . cyclobenzaprine (FLEXERIL) 10 MG tablet Take 1 tablet (10 mg total) by mouth at bedtime. 30 tablet 0  . diphenhydrAMINE (BENADRYL) 25 MG tablet Take 1 tablet (25 mg total) by mouth every 6 (six) hours as needed for itching. (Patient not taking: Reported on 01/14/2015) 30 tablet 0  . glucose blood (TRUETEST TEST) test  strip Check blood sugar TID & QHS 100 each 12  . glucose blood test strip Check blood sugar TID & QHS 100 each 12  . glucose monitoring kit (FREESTYLE) monitoring kit 1 each by Does not apply route 4 (four) times daily - after meals and at bedtime. 1 month Diabetic Testing Supplies for QAC-QHS accuchecks. 1 each 1  . hydrocortisone 2.5 % cream Apply topically 2 (two) times daily. Apply to rash areas, do not apply to face (Patient not taking: Reported on 01/14/2015) 30 g 0  . polyethylene glycol powder (GLYCOLAX/MIRALAX) powder Take 17 g by mouth 2 (two) times daily as needed. 3350 g 1  . traMADol (ULTRAM) 50 MG tablet Take 1 tablet (50 mg total) by mouth every 8 (eight) hours as needed for moderate pain. 30 tablet 0  . TRUEPLUS LANCETS 28G MISC Check blood sugar TID & QHS 100 each 2   No current facility-administered medications on file prior to visit.   Family History  Problem Relation Age of Onset  . Hypertension Mother   . Diabetes Maternal Grandmother    Social History   Social History  . Marital Status: Married    Spouse Name: N/A  . Number of Children: N/A  . Years of Education: N/A   Occupational History  . Not on file.   Social History Main Topics  . Smoking status: Never Smoker   . Smokeless tobacco: Not on file  . Alcohol  Use: No  . Drug Use: No  . Sexual Activity: Yes   Other Topics Concern  . Not on file   Social History Narrative    Review of Systems: Other than what is stated in HPI, all other systems are negative.   Objective:   Filed Vitals:   05/22/15 1643  BP: 119/81  Pulse: 83  Temp: 98 F (36.7 C)  Resp: 16    Physical Exam  Constitutional: She is oriented to person, place, and time.  Cardiovascular: Normal rate, regular rhythm and normal heart sounds.   Pulmonary/Chest: Effort normal and breath sounds normal.  Abdominal: Soft. Bowel sounds are normal. She exhibits no distension. There is no tenderness. No hernia.  Musculoskeletal: She  exhibits no edema.  Mild swelling of left middle finger  Neurological: She is alert and oriented to person, place, and time.  Skin: Skin is warm and dry.  Psychiatric: She has a normal mood and affect.     Lab Results  Component Value Date   WBC 7.9 01/15/2014   HGB 13.5 01/15/2014   HCT 38.0 01/15/2014   MCV 87.8 01/15/2014   PLT 328 01/15/2014   Lab Results  Component Value Date   CREATININE 0.63 02/14/2015   BUN 13 12/20/2014   NA 135 12/20/2014   K 4.0 12/20/2014   CL 99 12/20/2014   CO2 27 12/20/2014    Lab Results  Component Value Date   HGBA1C 6.70 05/22/2015   Lipid Panel     Component Value Date/Time   CHOL 185 07/14/2013 1708   TRIG 180* 07/14/2013 1708   HDL 39* 07/14/2013 1708   CHOLHDL 4.7 07/14/2013 1708   VLDL 36 07/14/2013 1708   LDLCALC 110* 07/14/2013 1708       Assessment and plan:   Connie Russell was seen today for hand pain.  Diagnoses and all orders for this visit:  Periumbilical abdominal pain -     Ambulatory referral to Gastroenterology I do not feel a hernia. Patient is on Omeprazole. Recent abdominal ultrasound was WNL. Will refer out   Type 2 diabetes mellitus without complication, without long-term current use of insulin (HCC) -     Glucose (CBG) -     HgB A1c -     Flu Vaccine QUAD 36+ mos PF IM (Fluarix & Fluzone Quad PF) -     metFORMIN (GLUCOPHAGE) 500 MG tablet; Take 1 tablet (500 mg total) by mouth 2 (two) times daily with a meal. Patients diabetes is well control as evidence by consistently low a1c.  Patient will continue with current therapy and continue to make necessary lifestyle changes.  Reviewed foot care, diet, exercise, annual health maintenance with patient.   Finger pain, left -     ibuprofen (ADVIL,MOTRIN) 600 MG tablet; Take 1 tablet (600 mg total) by mouth every 8 (eight) hours as needed.  Gastroesophageal reflux disease without esophagitis -     omeprazole (PRILOSEC) 20 MG capsule; Take 1 capsule (20 mg total) by  mouth daily. Discussed diet and weight with patient relating to acid reflux.  Went over things that may exacerbate acid reflux such as tomatoes, spicy foods, coffee, carbonated beverages, chocolates, etc.  Advised patient to avoid laying down at least two hours after meals and sleep with HOB elevated.    Return if symptoms worsen or fail to improve.       Lance Bosch, [facility] [phone] 05/22/2015, 5:05 PM

## 2015-05-22 NOTE — Progress Notes (Signed)
Virtual interpreter used Will Patient complains of having a lump with some pain right above her navel area Patient also complains of pain and swelling to her left middle finger but denies any injury

## 2015-05-27 ENCOUNTER — Encounter: Payer: Self-pay | Admitting: Gastroenterology

## 2015-06-06 ENCOUNTER — Ambulatory Visit (INDEPENDENT_AMBULATORY_CARE_PROVIDER_SITE_OTHER): Payer: Self-pay | Admitting: Obstetrics & Gynecology

## 2015-06-06 ENCOUNTER — Encounter: Payer: Self-pay | Admitting: Obstetrics & Gynecology

## 2015-06-06 VITALS — BP 122/81 | HR 87 | Temp 98.4°F | Wt 140.0 lb

## 2015-06-06 DIAGNOSIS — N907 Vulvar cyst: Secondary | ICD-10-CM

## 2015-06-06 NOTE — Progress Notes (Signed)
Subjective:     Patient ID: Connie Russell, female   DOB: 1970/09/24, 44 y.o.   MRN: [id number]  HPI G2P2 Patient's last menstrual period was 06/06/2015. 44 y.o. [facility] that is mildly uncomfortable but she wants removed. Present for about a year, recurred after drainage was attempted with needle. No Known Allergies Past Medical History  Diagnosis Date  . Hypertension   History reviewed. No pertinent past surgical history.  OB History    Gravida Para Term Preterm AB TAB SAB Ectopic Multiple Living   2 2        2         Review of Systems  Constitutional: Negative.   Genitourinary: Positive for vaginal pain (at cyst) and menstrual problem. Negative for vaginal discharge.       Left vulvar lesion       Objective:   Physical Exam  Constitutional: She is oriented to person, place, and time. She appears well-developed. No distress.  Genitourinary: No vaginal discharge found.  1 cm epidermal inclusion cyst right introitus not tender  Neurological: She is alert and oriented to person, place, and time.  Skin: Skin is warm and dry.  Psychiatric: She has a normal mood and affect. Her behavior is normal.  Patient gave consent for excision of cyst right vulva. Prepped with betadine and 1% lidocaine infiltrated. Surface of lesion excised with scissors, clear mucus expressed. Base cauterized with AgNO3.     Assessment:     Inclusion cyst excised and ablated     Plan:     Instructions for care given. RTC if pain swelling redness, bleeding   Adam PhenixJames G Maisy Newport, MD 06/06/2015

## 2015-06-06 NOTE — Patient Instructions (Signed)
Biopsia de vulva - Cuidados posteriores  (Vulva Biopsy, Care After) Siga estas instrucciones durante las prximas semanas. Estas indicaciones le proporcionan informacin general acerca de cmo deber cuidarse despus del procedimiento. El mdico tambin podr darle instrucciones ms especficas. El tratamiento ha sido planificado segn las prcticas mdicas actuales, pero en algunos casos pueden ocurrir complicaciones. Comunquese con el mdico si tiene algn problema o tiene preguntas despus del procedimiento.  INSTRUCCIONES PARA EL CUIDADO EN EL HOGAR   Slo tome medicamentos de venta libre o recetados para Primary school teachercalmar el dolor o Environmental health practitionerel malestar, segn las indicaciones de su mdico.  No frote la zona de la biopsia despus de Geographical information systems officerorinar. D golpecitos suaves para secar o use una botella llena con agua tibia (botella perineal) para limpiar la zona. Lmpiese suavemente desde adelante hacia atrs.  Higienice la zona con agua y un Palestinian Territoryjabn suave. Seque la zona con golpecitos suaves.  Revise el sitio de la biopsia con un espejo de Valero Energymano todos los das para asegurarse de que est curando correctamente.  No tenga relaciones sexuales durante 3 o 4 das o segn las indicaciones de su mdico.  Evite hacer ejercicios como correr o Lobbyistandar en bicicleta, durante 2 o 3 das o segn las indicaciones de su mdico.  Despus del procedimiento podr ducharse. Evite baarse y nadar General Millshasta que las suturas se disuelvan y la curacin sea Granbycompleta.  Use ropa interior suelta de algodn. No use pantalones ajustados.  Concurra puntualmente a las citas de control con el mdico.  Comunquese con su mdico para retirar el resultado de los estudios. SOLICITE ATENCIN MDICA DE INMEDIATO SI:   El dolor es ms intenso y los medicamentos no Contractorhacen efecto.  La zona vaginal est hinchada o irritada.  Drena lquido o siente mal olor en la vagina.  Tiene fiebre. ASEGRESE DE QUE:   Comprende estas instrucciones.  Controlar su  enfermedad.  Solicitar ayuda de inmediato si no mejora o si empeora.   Esta informacin no tiene Theme park managercomo fin reemplazar el consejo del mdico. Asegrese de hacerle al mdico cualquier pregunta que tenga.   Document Released: 07/13/2012 Elsevier Interactive Patient Education Yahoo! Inc2016 Elsevier Inc.

## 2015-06-07 ENCOUNTER — Encounter: Payer: Self-pay | Admitting: *Deleted

## 2015-06-13 ENCOUNTER — Encounter: Payer: Self-pay | Admitting: Nurse Practitioner

## 2015-06-13 ENCOUNTER — Ambulatory Visit (INDEPENDENT_AMBULATORY_CARE_PROVIDER_SITE_OTHER): Payer: Self-pay | Admitting: Nurse Practitioner

## 2015-06-13 ENCOUNTER — Other Ambulatory Visit: Payer: Self-pay

## 2015-06-13 VITALS — BP 111/68 | HR 71 | Temp 98.1°F | Ht 66.0 in | Wt 141.6 lb

## 2015-06-13 DIAGNOSIS — R1013 Epigastric pain: Secondary | ICD-10-CM

## 2015-06-13 DIAGNOSIS — K219 Gastro-esophageal reflux disease without esophagitis: Secondary | ICD-10-CM

## 2015-06-13 MED ORDER — OMEPRAZOLE 20 MG PO CPDR: 20.0000 mg | DELAYED_RELEASE_CAPSULE | Freq: Two times a day (BID) | ORAL | Status: DC

## 2015-06-13 NOTE — Progress Notes (Signed)
Primary Care Physician:  Lance Bosch, NP Primary Gastroenterologist:  Dr. Oneida Alar  Chief Complaint  Patient presents with  . Abdominal Pain    HPI:   44 year old female presents on referral from PCP for periumbilical abdominal pain. Visit assisted with dispense language interpreter present for the visit. PCP notes reviewed. Last office visit with primary care was 05/22/2015 for GERD, periumbilical abdominal pain and other chronic conditions. Per PCP notes patient has had a lump and pain around her navel that she first noticed several years ago with onset of pain after every meal and is described as swollen/burning and worse with palpation. Patient on omeprazole, recent abdominal ultrasound normal.  Today she states her pain is like a burning sensation, worse after eating and is associated with burning. Periumbilical pain occasionally radiates to her back. Symptoms occur worse with red meat. Has been on Prilosec for about a year. Denies hematochezia. Occasional dark stools but not tarry. Also has increased gas/flatuence. Also with esophageal burning and bitter taste. Denies change in bowel habits. Does have some constipation will sometimes go 2-3 days without a bowel movement. At times will have sensation of needing to have a bowel movement and will only has a small piece of stool after straining. Takes Ibuprofen for her knee pain about once a week to once every 2 weeks. Denies chest pain, dyspnea, dizziness, lightheadedness, syncope, near syncope. Denies any other upper or lower GI symptoms.  Past Medical History  Diagnosis Date  . Hypertension   . GERD (gastroesophageal reflux disease)   . Diabetes ([place])     No past surgical history on file.  Current Outpatient Prescriptions  Medication Sig Dispense Refill  . Aloe Vera 25 MG CAPS Take 1 capsule by mouth daily.    Marland Kitchen amLODipine (NORVASC) 10 MG tablet Take 1 tablet (10 mg total) by mouth daily. 30 tablet 3  . Blood Glucose Monitoring  Suppl (TRUERESULT BLOOD GLUCOSE) W/DEVICE KIT Check blood sugar TID & QHS 1 each 0  . cyclobenzaprine (FLEXERIL) 10 MG tablet Take 1 tablet (10 mg total) by mouth at bedtime. 30 tablet 0  . diphenhydrAMINE (BENADRYL) 25 MG tablet Take 1 tablet (25 mg total) by mouth every 6 (six) hours as needed for itching. 30 tablet 0  . glucose blood (TRUETEST TEST) test strip Check blood sugar TID & QHS 100 each 12  . glucose blood test strip Check blood sugar TID & QHS 100 each 12  . glucose monitoring kit (FREESTYLE) monitoring kit 1 each by Does not apply route 4 (four) times daily - after meals and at bedtime. 1 month Diabetic Testing Supplies for QAC-QHS accuchecks. 1 each 1  . hydrocortisone 2.5 % cream Apply topically 2 (two) times daily. Apply to rash areas, do not apply to face 30 g 0  . ibuprofen (ADVIL,MOTRIN) 600 MG tablet Take 1 tablet (600 mg total) by mouth every 8 (eight) hours as needed. 30 tablet 3  . metFORMIN (GLUCOPHAGE) 500 MG tablet Take 1 tablet (500 mg total) by mouth 2 (two) times daily with a meal. 180 tablet 3  . omeprazole (PRILOSEC) 20 MG capsule Take 1 capsule (20 mg total) by mouth daily. 30 capsule 3  . polyethylene glycol powder (GLYCOLAX/MIRALAX) powder Take 17 g by mouth 2 (two) times daily as needed. 3350 g 1  . traMADol (ULTRAM) 50 MG tablet Take 1 tablet (50 mg total) by mouth every 8 (eight) hours as needed for moderate pain. 30 tablet 0  .  TRUEPLUS LANCETS 28G MISC Check blood sugar TID & QHS 100 each 2   No current facility-administered medications for this visit.    Allergies as of 06/13/2015  . (No Known Allergies)    Family History  Problem Relation Age of Onset  . Hypertension Mother   . Diabetes Maternal Grandmother   . Colon cancer Neg Hx     Social History   Social History  . Marital Status: Married    Spouse Name: N/A  . Number of Children: N/A  . Years of Education: N/A   Occupational History  . Not on file.   Social History Main Topics  .  Smoking status: Never Smoker   . Smokeless tobacco: Never Used  . Alcohol Use: No  . Drug Use: No  . Sexual Activity: Yes   Other Topics Concern  . Not on file   Social History Narrative    Review of Systems: 10-point ROS negative except as per HPI.    Physical Exam: BP 111/68 mmHg  Pulse 71  Temp(Src) 98.1 F (36.7 C) (Oral)  Ht 5' 6"  (1.676 m)  Wt 141 lb 9.6 oz (64.229 kg)  BMI 22.87 kg/m2  LMP 06/06/2015 General:   Alert and oriented. Pleasant and cooperative. Well-nourished and well-developed.  Head:  Normocephalic and atraumatic. Eyes:  Without icterus, sclera clear and conjunctiva pink.  Cardiovascular:  S1, S2 present without murmurs appreciated. xtremities without clubbing or edema. Respiratory:  Clear to auscultation bilaterally. No wheezes, rales, or rhonchi. No distress.  Gastrointestinal:  +BS, soft, and non-distended. Mild epigastric TTP. No HSM noted. No guarding or rebound. No masses appreciated.  Rectal:  Deferred  Skin:  Intact without significant lesions or rashes. Neurologic:  Alert and oriented x4;  grossly normal neurologically. Psych:  Alert and cooperative. Normal mood and affect.    06/13/2015 10:43 AM

## 2015-06-13 NOTE — Patient Instructions (Addendum)
English   I will increase your medication Prilosec. Take 20 mg twice a day. Take it 30 minutes before your first meal of the day and again 30 minutes before your last meal the day.  We will schedule your procedure for you.  Return for a follow-up office visit in 3 months.  Espanol   Voy a aumentar su medicacin Prilosec. Tome 20 mg Consolidated Edisondos veces al da. Tmelo 30 minutos antes de su primera comida del da y de nuevo 30 minutos antes de su ltima comida el   Programaremos su procedimiento para usted.  Vuelva para una visita de la oficina de seguimiento en 3 meses.     Enfermedad por reflujo gastroesofgico en los adultos (Gastroesophageal Reflux Disease, Adult) Normalmente, los alimentos descienden por el esfago y se depositan en el estmago para su digestin. Sin embargo, cuando una persona tiene enfermedad por reflujo gastroesofgico (ERGE), los alimentos y el cido estomacal regresan al esfago. Cuando esto ocurre, el esfago se irrita y se inflama. Con el tiempo, la ERGE puede provocar la formacin de pequeas perforaciones (lceras) en la mucosa del esfago.  CAUSAS Un problema del msculo que se encuentra entre el esfago y Investment banker, corporateel estmago (esfnter esofgico inferior o EEI) es la causa de esta enfermedad. Por lo general, el esfnter esofgico inferior se cierra despus de que los alimentos pasan a travs del esfago hacia el Lincoln Parkestmago. Cuando el EEI est debilitado o no es normal, no se cierra correctamente, lo que permite el paso retrgrado de los alimentos y el cido estomacal al esfago. Algunas sustancias de la dieta, algunos medicamentos y Materials engineerciertas enfermedades pueden debilitar este esfnter, entre ellos:  Consumo de tabaco.  SilertonEmbarazo.  Hernia de hiato.  Consumo excesivo de alcohol.  Algunos alimentos y [address]ciertas bebidas, como el caf, el chocolate, las cebollas y Interior and spatial designerla menta. FACTORES DE RIESGO Es ms probable que esta afeccin se manifieste en:  Los personas con  sobrepeso.  Las personas con trastornos del tejido conjuntivo.  Las personas que toman antiinflamatorios no esteroides (AINE). SNTOMAS Los sntomas de esta afeccin incluyen lo siguiente:  Merchant navy officerAcidez estomacal.  Dificultad o dolor al tragar.  Sensacin de Warehouse managertener un bulto en la garganta.  Sabor amargo en la boca.  Mal aliento.  Gran cantidad de saliva.  Malestar estomacal o meteorismo.  Flatulencias.  Dolor en el pecho.  Falta de aire o sibilancias.  Tos permanente (crnica) o tos nocturna.  Desgaste el esmalte dental.  Prdida de peso. El dolor en el pecho puede deberse a muchas afecciones diferentes. Consulte al mdico si tiene Journalist, newspaperdolor en el pecho. DIAGNSTICO El mdico le har una historia clnica y un examen fsico. Para determinar si la ERGE es leve o grave, el mdico tambin puede controlar la respuesta al Miamitratamiento. Tambin pueden hacerle otros estudios, por ejemplo:  Una endoscopia para examinar el estmago y el esfago con Neomia Dearuna pequea cmara.  Un estudio que determina el nivel de acidez en el esfago.  Un estudio que mide la presin que hay en el esfago.  Un estudio de deglucin de bario o un estudio modificado de deglucin de bario para mostrar la forma, el tamao y el funcionamiento del esfago. TRATAMIENTO El objetivo del tratamiento es aliviar los sntomas y Automotive engineerevitar las complicaciones. El tratamiento de esta afeccin puede variar en funcin de la gravedad de los sntomas. El mdico podr indicar lo siguiente:  Cambios en la dieta.  Medicamentos.  Ciruga. INSTRUCCIONES PARA EL CUIDADO EN EL HOGAR Dieta  Siga la dieta  que le haya recomendado el mdico, la cual puede incluir evitar alimentos y bebidas tales como:  Caf y t (con o sin cafena).  Bebidas que contengan alcohol.  Bebidas energizantes y deportivas.  Gaseosas o refrescos.  Chocolate y cacao.  Menta y esencias de [address]  Ajo y cebollas.  Rbano picante.  Alimentos muy  condimentados y cidos, entre ellos, pimientos, [place] en polvo, curry en polvo, vinagre, salsas picantes y [address]  Frutas ctricas y sus jugos, como naranjas, limones y limas.  Alimentos a base de tomates, como salsa roja, [place], salsa y pizza con salsa roja.  Alimentos fritos y Lexicographer, como rosquillas, papas fritas y aderezos con alto contenido de Holiday representative.  Carnes con alto contenido de Newman, como hot dogs y cortes grasos de carnes rojas y blancas, por ejemplo, filetes de entrecot, salchicha, jamn y tocino.  Productos lcteos con alto contenido de [place], como [place], [place] y queso crema.  Haga comidas pequeas y frecuentes Freight forwarder de comidas abundantes.  Evite beber mucho lquido con las comidas.  No coma durante las 2 o 3horas previas a la hora de Rivereno.  No se acueste inmediatamente despus de comer.  No haga actividad fsica enseguida despus de comer. Instrucciones generales  Est atento a cualquier cambio en los sntomas.  Tome los medicamentos de venta libre y los recetados solamente como se lo haya indicado el mdico. No tome aspirina, ibuprofeno ni otros antiinflamatorios no esteroides (AINE), a menos que se lo haya indicado el mdico.  No consuma ningn producto que contenga tabaco, lo que incluye cigarrillos, tabaco de Theatre manager y Administrator, Civil Service. Si necesita ayuda para dejar de fumar, consulte al mdico.  Use ropas sueltas. No use prendas ajustadas alrededor de la cintura que ejerzan presin en el abdomen.  Levante (eleve) 6pulgadas (15centmetros) la cabecera de la cama.  Trate de reducir [employer]ney de estrs con actividades como el yoga o la meditacin. Si necesita ayuda para reducir J. [employer]ney de estrs, consulte al mdico.  Si tiene sobrepeso, Media planner un peso saludable. Hable con el mdico acerca de su peso ideal y pdale asesoramiento en cuanto a la dieta que debe seguir para Office manager.  Concurra a todas las visitas de control como se lo haya indicado el mdico. Esto es importante. SOLICITE ATENCIN MDICA SI:  Aparecen nuevos sntomas.  Baja de peso sin causa aparente.  Tiene dificultad para tragar o siente dolor al Darden Restaurants.  Tiene sibilancias o tos persistente.  Los sntomas no mejoran con Scientist, research (medical).  Tiene la voz ronca. SOLICITE ATENCIN MDICA DE Engelhard Corporation SI:  Tiene dolor en los brazos, el cuello, los [place], la dentadura o la espalda.  Berenice Primas, se marea o tiene sensacin de desvanecimiento.  Siente falta de aire o Journalist, newspaper.  Vomita y el vmito es parecido a la sangre o a los granos de caf.  Se desmaya.  Las heces son sanguinolentas o de color negro.  No puede tragar, beber o comer.   Esta informacin no tiene Theme park manager el consejo del mdico. Asegrese de hacerle al mdico cualquier pregunta que tenga.   Document Released: 05/06/2005 Document Revised: 04/17/2015 Elsevier Interactive Patient Education 2016 [employer].   Opciones de alimentos para pacientes con reflujo gastroesofgico - Adultos (Food Choices for Gastroesophageal Reflux Disease, Adult) Cuando se tiene reflujo gastroesofgico (ERGE), los alimentos que se ingieren y los hbitos de alimentacin son Engineer, production. Elegir los alimentos adecuados puede ayudar a  aliviar las molestias ocasionadas por el [employer]. QU PAUTAS GENERALES DEBO SEGUIR?  Elija las frutas, los vegetales, los cereales integrales, los productos lcteos, la carne de [place], de pescado y de ave con bajo contenido de grasas.  Limite las grasas, [address] [place], los aderezos para [place], la Lorenzo, los frutos secos y Programme researcher, broadcasting/film/video.  Lleve un registro de las comidas para identificar los alimentos que ocasionan sntomas.  Evite los alimentos que le ocasionen reflujo. Pueden ser distintos para cada persona.  Haga comidas pequeas con frecuencia en lugar de tres comidas  OfficeMax Incorporated.  Coma lentamente, en un clima distendido.  Limite el consumo de alimentos fritos.  Cocine los alimentos utilizando mtodos que no sean la fritura.  Evite el consumo alcohol.  Evite beber grandes cantidades de lquidos con las comidas.  Evite agacharse o recostarse hasta despus de 2 o 3horas de haber comido. QU ALIMENTOS NO SE RECOMIENDAN? Los siguientes son algunos alimentos y bebidas que pueden empeorar los sntomas: Veterinary surgeon. Jugo de tomate. Salsa de tomate y espagueti. Ajes. Cebolla y [place]. Rbano picante. Frutas Naranjas, pomelos y limn (fruta y [place]). Carnes Carnes de Gerald, de pescado y de ave con gran contenido de grasas. Esto incluye los perros calientes, las [place], el [place], la salchicha, el salame y el tocino. Lcteos Leche entera y [place]. PPG [employer]. Crema. Mantequilla. Helados. Queso crema.  Bebidas Caf y t negro, con o sin cafena Bebidas gaseosas o energizantes. Condimentos Salsa picante. Salsa barbacoa.  Dulces/postres Chocolate y cacao. Rosquillas. Menta y mentol. Grasas y [employer] Life con alto contenido de grasas, incluidas las papas fritas. Otros Vinagre. Especias picantes, como la Br[employer], la pimienta blanca, la pimienta roja, la pimienta de cayena, el curry en [place], los clavos de [place], el jengibre y el [place] en polvo. Los artculos mencionados arriba pueden no ser Raytheon de las bebidas y los alimentos que se Theatre stage manager. Comunquese con el nutricionista para recibir ms informacin.   Esta informacin no tiene Theme park manager el consejo del mdico. Asegrese de hacerle al mdico cualquier pregunta que tenga.   Document Released: 05/06/2005 Document Revised: 08/17/2014 Elsevier Interactive Patient Education Yahoo! Inc.

## 2015-06-13 NOTE — Assessment & Plan Note (Addendum)
Patient with a history of GERD per PCP, currently on Prilosec once daily. She is having worsening GERD-like symptoms including abdominal burning worse after eating. At this point we'll plan for an endoscopy to further evaluate her symptoms and increase her Prilosec to twice daily in the interim. Return for follow-up in 3 months. Advised to avoid NSAIDs.  Proceed with EGD with Dr. Darrick PennaFields in near future: the risks, benefits, and alternatives have been discussed with the patient in detail. The patient states understanding and desires to proceed.  The patient is on Ultram when necessary. She is not on any anticoagulants, antidepressants, or anxiolytics. Denies alcohol and drug use. Conscious sedation should be adequate for her procedure.

## 2015-06-13 NOTE — Assessment & Plan Note (Signed)
Patient with upper abdominal and periumbilical burning pain which is worse after eating and occasionally radiates to her back. Recent ultrasound shows normal gallbladder, normal common bile duct. She has a history of GERD is been on Prilosec for about a year, taken once daily. Also admits that her taste in her mouth and esophageal burning. At this point we will increase her Prilosec twice daily and work her up for an endoscopy to further evaluate her symptoms given continued GERD symptoms on PPI. Return for follow-up in 3 months.

## 2015-06-14 NOTE — Progress Notes (Signed)
CC'ED TO PCP 

## 2015-06-24 ENCOUNTER — Ambulatory Visit (HOSPITAL_COMMUNITY)
Admission: RE | Admit: 2015-06-24 | Discharge: 2015-06-24 | Disposition: A | Discharge status: Home / Self Care | Payer: Self-pay | Source: Ambulatory Visit | Attending: Gastroenterology | Admitting: Gastroenterology

## 2015-06-24 ENCOUNTER — Encounter (HOSPITAL_COMMUNITY): Payer: Self-pay | Admitting: *Deleted

## 2015-06-24 ENCOUNTER — Encounter (HOSPITAL_COMMUNITY)
Admission: RE | Disposition: A | Discharge status: Home / Self Care | Payer: Self-pay | Source: Ambulatory Visit | Attending: Gastroenterology

## 2015-06-24 DIAGNOSIS — Z79899 Other long term (current) drug therapy: Secondary | ICD-10-CM | POA: Insufficient documentation

## 2015-06-24 DIAGNOSIS — K2950 Unspecified chronic gastritis without bleeding: Secondary | ICD-10-CM | POA: Insufficient documentation

## 2015-06-24 DIAGNOSIS — T801XXA Vascular complications following infusion, transfusion and therapeutic injection, initial encounter: Secondary | ICD-10-CM | POA: Insufficient documentation

## 2015-06-24 DIAGNOSIS — E119 Type 2 diabetes mellitus without complications: Secondary | ICD-10-CM | POA: Insufficient documentation

## 2015-06-24 DIAGNOSIS — I10 Essential (primary) hypertension: Secondary | ICD-10-CM | POA: Insufficient documentation

## 2015-06-24 DIAGNOSIS — R1013 Epigastric pain: Secondary | ICD-10-CM

## 2015-06-24 DIAGNOSIS — K219 Gastro-esophageal reflux disease without esophagitis: Secondary | ICD-10-CM | POA: Insufficient documentation

## 2015-06-24 DIAGNOSIS — K2970 Gastritis, unspecified, without bleeding: Secondary | ICD-10-CM

## 2015-06-24 HISTORY — PX: ESOPHAGOGASTRODUODENOSCOPY: SHX5428

## 2015-06-24 LAB — GLUCOSE, CAPILLARY: Glucose-Capillary: 120 mg/dL — ABNORMAL HIGH (ref 65–99)

## 2015-06-24 SURGERY — EGD (ESOPHAGOGASTRODUODENOSCOPY)
Anesthesia: Moderate Sedation

## 2015-06-24 MED ORDER — SODIUM CHLORIDE 0.9 % IV SOLN
INTRAVENOUS | Status: DC
  Administered 2015-06-24: 1000 mL via INTRAVENOUS

## 2015-06-24 MED ORDER — MIDAZOLAM HCL 5 MG/5ML IJ SOLN
INTRAMUSCULAR | Status: AC
  Filled 2015-06-24: qty 10

## 2015-06-24 MED ORDER — MEPERIDINE HCL 100 MG/ML IJ SOLN
INTRAMUSCULAR | Status: AC
  Filled 2015-06-24: qty 2

## 2015-06-24 MED ORDER — LIDOCAINE VISCOUS 2 % MT SOLN
OROMUCOSAL | Status: DC | PRN
  Administered 2015-06-24: 1 via OROMUCOSAL

## 2015-06-24 MED ORDER — MIDAZOLAM HCL 5 MG/5ML IJ SOLN
INTRAMUSCULAR | Status: DC | PRN
  Administered 2015-06-24 (×2): 2 mg via INTRAVENOUS

## 2015-06-24 MED ORDER — MEPERIDINE HCL 100 MG/ML IJ SOLN
INTRAMUSCULAR | Status: DC | PRN
  Administered 2015-06-24: 50 mg via INTRAVENOUS

## 2015-06-24 MED ORDER — STERILE WATER FOR IRRIGATION IR SOLN
Status: DC | PRN
  Administered 2015-06-24: 12:00:00

## 2015-06-24 MED ORDER — LIDOCAINE VISCOUS 2 % MT SOLN
OROMUCOSAL | Status: AC
  Filled 2015-06-24: qty 15

## 2015-06-24 NOTE — Op Note (Addendum)
[facility] [address] [place] [id number]   ENDOSCOPY PROCEDURE REPORT  PATIENT: Connie Russell, Connie Russell  MR#: [id number] BIRTHDATE: [birth year] , 43  yrs. old GENDER: female  ENDOSCOPIST: West BaliSandi L Anzel Kearse, MD REFERRED BY:   Holland CommonsVALERIE KECK NP-c  PROCEDURE DATE: 06/24/2015 PROCEDURE:   EGD w/ biopsy  INDICATIONS:dyspepsia.   PERI-UMBILICAL ABDOMINAL PAIN.  USING IBUPROFEN AND GLUCOPHAGE. MAY 2016/MRI JUL 2016-NAIAP, CONSTIPATION. MEDICATIONS: Demerol 50 mg IV and Versed 4 mg IV TOPICAL ANESTHETIC:   Viscous Xylocaine ASA CLASS:  DESCRIPTION OF PROCEDURE:     Physical exam was performed.  Informed consent was obtained from the patient after explaining the benefits, risks, and alternatives to the procedure.  The patient was connected to the monitor and placed in the left lateral position.  Continuous oxygen was provided by nasal cannula and IV medicine administered through an indwelling cannula.  After administration of sedation, the patients esophagus was intubated and the EG-[id number] ([id number]  endoscope was advanced under direct visualization to the second portion of the duodenum.  The scope was removed slowly by carefully examining the color, texture, anatomy, and integrity of the mucosa on the way out.  The patient was recovered in endoscopy and discharged home in satisfactory condition.  Estimated blood loss is zero unless otherwise noted in this procedure report.    ESOPHAGUS: The mucosa of the esophagus appeared normal.   STOMACH: Mild non-erosive gastritis (inflammation) was found in the gastric antrum and at the pylorus.  Multiple biopsies were performed using cold forceps.   DUODENUM: The duodenal mucosa showed no abnormalities in the bulb and 2nd part of the duodenum.  Cold forceps biopsies were taken in the bulb and second portion. COMPLICATIONS: PT HAD PHLEBITIS AFTER DEMEROL.  ENDOSCOPIC IMPRESSION: 1.   ABDOMINAL PAIN MOST LIKELY DUE TO  CONSTIPATION, GERD, AND GASTRITIS. 2.   MILD Non-erosive gastritis  RECOMMENDATIONS: Stop ibuprofen.  USE TYLENOL PRN FOR ABDOMINAL PAIN. CONTINUE ALOE VERA & MIRALAX. OMEPRAZOLE 30 MINUTES PRIOR TO MEALS TWICE DAILY. AWAIT BIOPSY RESULTS. FOLLOW UP IN 3 MOS W/ SLF. CONSIDER MORE AGGRESSIVE BOWEL REGIMEN +/- TCS/GIVENS IF ABDOMINAL PAIN NOT IMPROVED AT NEXT OPV. NEXT ENDOSCOPY WITH FENTANYL.   REPEAT EXAM:    eSigned:  West BaliSandi L Alfonso Shackett, MD 06/25/2015 1:22 PMRevised: 06/25/2015 1:22 PM  CPT CODES: ICD CODES:  The ICD and CPT codes recommended by this software are interpretations from the data that the clinical staff has captured with the software.  The verification of the translation of this report to the ICD and CPT codes and modifiers is the sole responsibility of the health care institution and practicing physician where this report was generated.  [employer] will not be held responsible for the validity of the ICD and CPT codes included on this report.  AMA assumes no liability for data contained or not contained herein. CPT is a Publishing rights managerregistered trademark of the Citigroupmerican Medical Association.

## 2015-06-24 NOTE — H&P (Signed)
Primary Care Physician:  Lance Bosch, NP Primary Gastroenterologist:  Dr. Oneida Alar  Pre-Procedure History & Physical: HPI:  Connie Russell is a 44 y.o. female here for DYSPEPSIA.  Past Medical History  Diagnosis Date  . Hypertension   . GERD (gastroesophageal reflux disease)   . Diabetes [facility])     Past Surgical History  Procedure Laterality Date  . Foot surgery Left 2000    Local    Prior to Admission medications   Medication Sig Start Date End Date Taking? Authorizing Provider  amLODipine (NORVASC) 10 MG tablet Take 1 tablet (10 mg total) by mouth daily. 03/05/15  Yes Lorayne Marek, MD  Blood Glucose Monitoring Suppl (TRUERESULT BLOOD GLUCOSE) W/DEVICE KIT Check blood sugar TID & QHS 12/21/14  Yes Deepak Advani, MD  diphenhydrAMINE (BENADRYL) 25 MG tablet Take 1 tablet (25 mg total) by mouth every 6 (six) hours as needed for itching. 02/15/14  Yes Lance Bosch, NP  glucose blood (TRUETEST TEST) test strip Check blood sugar TID & QHS 12/21/14  Yes Deepak Advani, MD  glucose blood test strip Check blood sugar TID & QHS 12/21/14  Yes Deepak Advani, MD  ibuprofen (ADVIL,MOTRIN) 600 MG tablet Take 1 tablet (600 mg total) by mouth every 8 (eight) hours as needed. 05/22/15  Yes Lance Bosch, NP  metFORMIN (GLUCOPHAGE) 500 MG tablet Take 1 tablet (500 mg total) by mouth 2 (two) times daily with a meal. 05/22/15  Yes Lance Bosch, NP  omeprazole (PRILOSEC) 20 MG capsule Take 1 capsule (20 mg total) by mouth 2 (two) times daily before a meal. 06/13/15  Yes Carlis Stable, NP  Aloe Vera 25 MG CAPS Take 1 capsule by mouth daily.    Historical Provider, MD  cyclobenzaprine (FLEXERIL) 10 MG tablet Take 1 tablet (10 mg total) by mouth at bedtime. 12/20/14   Lorayne Marek, MD  glucose monitoring kit (FREESTYLE) monitoring kit 1 each by Does not apply route 4 (four) times daily - after meals and at bedtime. 1 month Diabetic Testing Supplies for QAC-QHS accuchecks. 12/20/14   Lorayne Marek, MD   hydrocortisone 2.5 % cream Apply topically 2 (two) times daily. Apply to rash areas, do not apply to face 02/15/14   Lance Bosch, NP  polyethylene glycol powder (GLYCOLAX/MIRALAX) powder Take 17 g by mouth 2 (two) times daily as needed. 01/04/14   Lance Bosch, NP  traMADol (ULTRAM) 50 MG tablet Take 1 tablet (50 mg total) by mouth every 8 (eight) hours as needed for moderate pain. 04/18/14   Lorayne Marek, MD  TRUEPLUS LANCETS 28G MISC Check blood sugar TID & QHS 12/21/14   Lorayne Marek, MD    Allergies as of 06/13/2015  . (No Known Allergies)    Family History  Problem Relation Age of Onset  . Hypertension Mother   . Diabetes Maternal Grandmother   . Colon cancer Neg Hx     Social History   Social History  . Marital Status: Married    Spouse Name: N/A  . Number of Children: N/A  . Years of Education: N/A   Occupational History  . Not on file.   Social History Main Topics  . Smoking status: Never Smoker   . Smokeless tobacco: Never Used  . Alcohol Use: No  . Drug Use: No  . Sexual Activity: Yes   Other Topics Concern  . Not on file   Social History Narrative    Review of Systems: See HPI, otherwise  negative ROS   Physical Exam: BP 113/69 mmHg  Pulse 88  Temp(Src) 99 F (37.2 C) (Oral)  Resp 17  SpO2 100%  LMP 06/24/2015 General:   Alert,  pleasant and cooperative in NAD Head:  Normocephalic and atraumatic. Neck:  Supple; Lungs:  Clear throughout to auscultation.    Heart:  Regular rate and rhythm. Abdomen:  Soft, nontender and nondistended. Normal bowel sounds, without guarding, and without rebound.   Neurologic:  Alert and  oriented x4;  grossly normal neurologically.  Impression/Plan:    DYSPEPSIA  PLAN:  EGD TODAY

## 2015-06-24 NOTE — Discharge Instructions (Signed)
HER EXAM TODAY shows mild gastritis.  I biopsied her stomach and small bowel.   Stop ibuprofen. USE TYLENOL AS NEEDED FOR ABDOMINAL PAIN.  CONTINUE ALOE VERA & MIRALAX.  CONTINUE OMEPRAZOLE.  TAKE 30 MINUTES PRIOR TO YOUR MEALS TWICE DAILY.  YOUR BIOPSY RESULTS WILL BE AVAILABLE IN MY CHART AFTER NOV 16 AND MY OFFICE WILL CONTACT YOU IN 10-14 DAYS WITH YOUR RESULTS.   FOLLOW UP IN 3 MOS.      Esofagogastroduodenoscopia: cuidados posteriores (Esophagogastroduodenoscopy, Care After) Siga estas instrucciones durante las prximas semanas. Estas indicaciones le proporcionan informacin acerca de cmo deber cuidarse despus del procedimiento. El mdico tambin podr darle instrucciones ms especficas. El tratamiento ha sido planificado segn las prcticas mdicas actuales, pero en algunos casos pueden ocurrir problemas. Comunquese con el mdico si tiene algn problema o tiene dudas despus del procedimiento.  QU ESPERAR DESPUS DEL PROCEDIMIENTO  Despus del procedimiento, es tpico tener las siguientes sensaciones:   Dance movement psychotherapistDolor en la garganta.  Dolor al tragar.  Ganas de vomitar (nuseas).  Hinchazn.  Mareos.  Cansancio. INSTRUCCIONES PARA EL CUIDADO EN EL HOGAR  No coma ni beba nada hasta que el efecto del anestsico (anestesia local) haya desaparecido y vuelva a tener reflejo farngeo. Si puede tragar con comodidad, significa que el efecto de la anestesia local ha desaparecido.  No conduzca ni opere maquinarias hasta que el mdico lo autorice.  Tome los medicamentos solamente como se lo haya indicado el mdico. SOLICITE ATENCIN MDICA SI:   No puede dejar de toser.  No orina u orina menos de lo habitual. SOLICITE ATENCIN MDICA DE INMEDIATO SI:  Tiene dificultad para tragar.  No puede comer o beber.  El dolor de garganta o pecho empeora.  Est mareado, tiene una sensacin de desvanecimiento o se desmaya.  Tiene nuseas o vmitos.  Tiene escalofros.  Tiene  fiebre.  Siente un dolor abdominal intenso.  La materia fecal es negra, de aspecto alquitranado o sanguinolenta.   Esta informacin no tiene Theme park managercomo fin reemplazar el consejo del mdico. Asegrese de hacerle al mdico cualquier pregunta que tenga.   Document Released: 11/21/2012 Document Revised: 08/17/2014 Elsevier Interactive Patient Education [employer]2016 Elsevier Inc.

## 2015-06-24 NOTE — OR Nursing (Signed)
After giving first dose of Demerol patient started scratching on right arm, I observed whelps on right arm, Dr. Darrick PennaFields notified wet compress applied

## 2015-06-24 NOTE — Progress Notes (Signed)
REVIEWED-NO ADDITIONAL RECOMMENDATIONS. 

## 2015-06-27 ENCOUNTER — Telehealth: Payer: Self-pay | Admitting: Gastroenterology

## 2015-06-27 NOTE — Telephone Encounter (Signed)
Please call pt. Connie Russell stomach Bx shows gastritis. Connie Russell abdominal pain is most likely die to gastritis due to ibuprofen, and CONSTIPATION.    Stop ibuprofen. USE TYLENOL AS NEEDED FOR ABDOMINAL PAIN.  CONTINUE ALOE VERA & MIRALAX.  CONTINUE OMEPRAZOLE.  TAKE 30 MINUTES PRIOR TO YOUR MEALS TWICE DAILY.  FOLLOW UP IN 3 MOS E30 constipation/gastritis.

## 2015-06-27 NOTE — Telephone Encounter (Signed)
OV MADE °

## 2015-06-28 NOTE — Telephone Encounter (Signed)
Called and informed pt. She said she understands English some. Mailing a copy to her also.

## 2015-07-01 ENCOUNTER — Telehealth: Payer: Self-pay | Admitting: Internal Medicine

## 2015-07-01 DIAGNOSIS — I10 Essential (primary) hypertension: Secondary | ICD-10-CM

## 2015-07-01 NOTE — Telephone Encounter (Signed)
Patient called requesting a medication refill for amLODipine (NORVASC) °Please follow up with patient °

## 2015-07-02 ENCOUNTER — Encounter (HOSPITAL_COMMUNITY): Payer: Self-pay | Admitting: Gastroenterology

## 2015-07-02 MED ORDER — AMLODIPINE BESYLATE 10 MG PO TABS: 10.0000 mg | ORAL_TABLET | Freq: Every day | ORAL | Status: DC

## 2015-07-16 ENCOUNTER — Telehealth: Payer: Self-pay | Admitting: Internal Medicine

## 2015-07-16 DIAGNOSIS — K219 Gastro-esophageal reflux disease without esophagitis: Secondary | ICD-10-CM

## 2015-07-16 MED ORDER — OMEPRAZOLE 20 MG PO CPDR: 20.0000 mg | DELAYED_RELEASE_CAPSULE | Freq: Two times a day (BID) | ORAL | Status: DC

## 2015-07-16 NOTE — Telephone Encounter (Signed)
Patient called requesting a medication refill for omeprozale

## 2015-08-14 MED FILL — ?OMEPRAZOLE DR 20 MG CAPSUL: 20 | 30 days supply | Qty: 30 | Fill #3

## 2015-08-26 ENCOUNTER — Ambulatory Visit: Payer: Self-pay | Attending: Internal Medicine | Admitting: Internal Medicine

## 2015-08-26 ENCOUNTER — Encounter: Payer: Self-pay | Admitting: Internal Medicine

## 2015-08-26 ENCOUNTER — Telehealth: Payer: Self-pay

## 2015-08-26 ENCOUNTER — Other Ambulatory Visit: Payer: Self-pay

## 2015-08-26 VITALS — BP 124/81 | HR 93 | Temp 98.0°F | Resp 16 | Ht 66.0 in | Wt 144.0 lb

## 2015-08-26 DIAGNOSIS — E119 Type 2 diabetes mellitus without complications: Secondary | ICD-10-CM | POA: Insufficient documentation

## 2015-08-26 DIAGNOSIS — Z79899 Other long term (current) drug therapy: Secondary | ICD-10-CM | POA: Insufficient documentation

## 2015-08-26 DIAGNOSIS — K219 Gastro-esophageal reflux disease without esophagitis: Secondary | ICD-10-CM | POA: Insufficient documentation

## 2015-08-26 DIAGNOSIS — I10 Essential (primary) hypertension: Secondary | ICD-10-CM | POA: Insufficient documentation

## 2015-08-26 DIAGNOSIS — Z7984 Long term (current) use of oral hypoglycemic drugs: Secondary | ICD-10-CM | POA: Insufficient documentation

## 2015-08-26 DIAGNOSIS — R0789 Other chest pain: Secondary | ICD-10-CM | POA: Insufficient documentation

## 2015-08-26 DIAGNOSIS — Z7982 Long term (current) use of aspirin: Secondary | ICD-10-CM | POA: Insufficient documentation

## 2015-08-26 MED ORDER — ASPIRIN EC 81 MG PO TBEC: 81.0000 mg | DELAYED_RELEASE_TABLET | Freq: Every day | ORAL | Status: DC

## 2015-08-26 NOTE — Progress Notes (Signed)
Patient ID: Connie Russell, female   DOB: 07/12/1971, 45 y.o.   MRN: [id number]  CC: chest discomfort  HPI: Connie Russell is a 45 y.o. female here today for a follow up visit.  Patient has past medical history of hypertension, GERD, and diabetes. Patient reports that over the past 1.5 weeks she has two episodes of chest pain described as "something squeezing my heart". Pain is located on left side of chest like a pressure sensation. When she has chest pain she feels SOB. Pain is not associated with exertion. She had taken Omeprazole on those days and it was not similar to gerd discomfort.  Review of EMR reveals that patient has had similar episodes with chest pain and SOB and was placed on anti-hypertensive's which resolved the problem. Past EKG revealed incomplete bundle branch blocks which is the same today.  No Known Allergies Past Medical History  Diagnosis Date  . Hypertension   . GERD (gastroesophageal reflux disease)   . Diabetes [facility])    Current Outpatient Prescriptions on File Prior to Visit  Medication Sig Dispense Refill  . amLODipine (NORVASC) 10 MG tablet Take 1 tablet (10 mg total) by mouth daily. 30 tablet 3  . cyclobenzaprine (FLEXERIL) 10 MG tablet Take 1 tablet (10 mg total) by mouth at bedtime. 30 tablet 0  . metFORMIN (GLUCOPHAGE) 500 MG tablet Take 1 tablet (500 mg total) by mouth 2 (two) times daily with a meal. 180 tablet 3  . omeprazole (PRILOSEC) 20 MG capsule Take 1 capsule (20 mg total) by mouth 2 (two) times daily before a meal. 60 capsule 3  . Aloe Vera 25 MG CAPS Take 1 capsule by mouth daily.    . Blood Glucose Monitoring Suppl (TRUERESULT BLOOD GLUCOSE) W/DEVICE KIT Check blood sugar TID & QHS 1 each 0  . diphenhydrAMINE (BENADRYL) 25 MG tablet Take 1 tablet (25 mg total) by mouth every 6 (six) hours as needed for itching. 30 tablet 0  . glucose blood (TRUETEST TEST) test strip Check blood sugar TID & QHS 100 each 12  . glucose blood test strip  Check blood sugar TID & QHS 100 each 12  . glucose monitoring kit (FREESTYLE) monitoring kit 1 each by Does not apply route 4 (four) times daily - after meals and at bedtime. 1 month Diabetic Testing Supplies for QAC-QHS accuchecks. 1 each 1  . hydrocortisone 2.5 % cream Apply topically 2 (two) times daily. Apply to rash areas, do not apply to face 30 g 0  . polyethylene glycol powder (GLYCOLAX/MIRALAX) powder Take 17 g by mouth 2 (two) times daily as needed. 3350 g 1  . traMADol (ULTRAM) 50 MG tablet Take 1 tablet (50 mg total) by mouth every 8 (eight) hours as needed for moderate pain. 30 tablet 0  . TRUEPLUS LANCETS 28G MISC Check blood sugar TID & QHS 100 each 2   No current facility-administered medications on file prior to visit.   Family History  Problem Relation Age of Onset  . Hypertension Mother   . Diabetes Maternal Grandmother   . Colon cancer Neg Hx    Social History   Social History  . Marital Status: Married    Spouse Name: N/A  . Number of Children: N/A  . Years of Education: N/A   Occupational History  . Not on file.   Social History Main Topics  . Smoking status: Never Smoker   . Smokeless tobacco: Never Used  . Alcohol Use: No  .  Drug Use: No  . Sexual Activity: Yes   Other Topics Concern  . Not on file   Social History Narrative    Review of Systems: Other than what is stated in HPI, all other systems are negative.   Objective:   Filed Vitals:   08/26/15 1657  BP: 124/81  Pulse: 93  Temp: 98 F (36.7 C)  Resp: 16    Physical Exam  Constitutional: She is oriented to person, place, and time. No distress.  Cardiovascular: Normal rate, regular rhythm and normal heart sounds.   Pulmonary/Chest: Effort normal and breath sounds normal. She has no wheezes. She exhibits no tenderness.  Abdominal: Soft. Bowel sounds are normal. There is no tenderness.  Neurological: She is alert and oriented to person, place, and time.  Skin: Skin is warm and dry.  She is not diaphoretic.  Psychiatric: She has a normal mood and affect.     Lab Results  Component Value Date   WBC 7.9 01/15/2014   HGB 13.5 01/15/2014   HCT 38.0 01/15/2014   MCV 87.8 01/15/2014   PLT 328 01/15/2014   Lab Results  Component Value Date   CREATININE 0.63 02/14/2015   BUN 13 12/20/2014   NA 135 12/20/2014   K 4.0 12/20/2014   CL 99 12/20/2014   CO2 27 12/20/2014    Lab Results  Component Value Date   HGBA1C 6.70 05/22/2015   Lipid Panel     Component Value Date/Time   CHOL 185 07/14/2013 1708   TRIG 180* 07/14/2013 1708   HDL 39* 07/14/2013 1708   CHOLHDL 4.7 07/14/2013 1708   VLDL 36 07/14/2013 1708   LDLCALC 110* 07/14/2013 1708       Assessment and plan:   Connie Russell was seen today for chest discomfort.  Diagnoses and all orders for this visit:  Chest discomfort -     aspirin EC 81 MG tablet; Take 1 tablet (81 mg total) by mouth daily. EKG: unchanged from previous tracings, incomplete right bundle branch block. Patient is a diabetic, I will check a lipid panel on next visit and place her on statin therapy as appropriate. She will begin a daily aspirin. If chest pain continues she may need Cardiology referral.  Due to language barrier, an interpreter was present during the history-taking and subsequent discussion (and for part of the physical exam) with this patient.   Return in about 1 week (around 09/02/2015) for Diabetes Mellitus.       Lance Bosch, [facility] [phone] 08/26/2015, 5:14 PM

## 2015-08-26 NOTE — Telephone Encounter (Signed)
Interpreter line used Diamond NickelBeatrice id# [phone] Spoke with patient today Patient stated about a week ago she was experiencing chest pain and rapid heart rate Same symptoms happened two days ago Patient stated she did not seek medical help because she has no insurance i explained the dangers of the stress she is doing to her heart if in fact she is having a heart issues

## 2015-08-26 NOTE — Progress Notes (Signed)
Patient complains of having two episodes recently where she was having some Chest pain and it felt like something was squeezing her heart Patient states she recently refilled her prilosec and the pill looked different and \ Was wondering if this is what caused her chest pain

## 2015-09-02 MED FILL — AMLODIPINE BESYLATE 10 MG T: 10 | 30 days supply | Qty: 30 | Fill #2

## 2015-09-05 MED FILL — metFORMIN HCL 500 MG TABS: 500 | 30 days supply | Qty: 60 | Fill #4

## 2015-09-09 ENCOUNTER — Encounter: Payer: Self-pay | Admitting: Internal Medicine

## 2015-09-09 ENCOUNTER — Ambulatory Visit: Payer: Self-pay | Attending: Internal Medicine | Admitting: Internal Medicine

## 2015-09-09 VITALS — BP 111/75 | HR 76 | Temp 98.0°F | Resp 16 | Ht 66.0 in | Wt 143.0 lb

## 2015-09-09 DIAGNOSIS — Z794 Long term (current) use of insulin: Secondary | ICD-10-CM

## 2015-09-09 DIAGNOSIS — Z7984 Long term (current) use of oral hypoglycemic drugs: Secondary | ICD-10-CM | POA: Insufficient documentation

## 2015-09-09 DIAGNOSIS — Z79899 Other long term (current) drug therapy: Secondary | ICD-10-CM | POA: Insufficient documentation

## 2015-09-09 DIAGNOSIS — I10 Essential (primary) hypertension: Secondary | ICD-10-CM

## 2015-09-09 DIAGNOSIS — Z7982 Long term (current) use of aspirin: Secondary | ICD-10-CM | POA: Insufficient documentation

## 2015-09-09 DIAGNOSIS — E119 Type 2 diabetes mellitus without complications: Secondary | ICD-10-CM

## 2015-09-09 LAB — COMPLETE METABOLIC PANEL WITH GFR
ALBUMIN: 4.2 g/dL (ref 3.6–5.1)
ALK PHOS: 65 U/L (ref 33–115)
ALT: 35 U/L — ABNORMAL HIGH (ref 6–29)
AST: 22 U/L (ref 10–30)
BUN: 10 mg/dL (ref 7–25)
CALCIUM: 9.2 mg/dL (ref 8.6–10.2)
CO2: 29 mmol/L (ref 20–31)
Chloride: 102 mmol/L (ref 98–110)
Creat: 0.53 mg/dL (ref 0.50–1.10)
GFR, Est Non African American: >89 mL/min (ref >=60)
Glucose, Bld: 152 mg/dL — ABNORMAL HIGH (ref 65–99)
POTASSIUM: 4.7 mmol/L (ref 3.5–5.3)
Sodium: 137 mmol/L (ref 135–146)
Total Bilirubin: 0.5 mg/dL (ref 0.2–1.2)
Total Protein: 6.7 g/dL (ref 6.1–8.1)

## 2015-09-09 LAB — POCT GLYCOSYLATED HEMOGLOBIN (HGB A1C): Hemoglobin A1C: 7

## 2015-09-09 LAB — TSH: TSH: 1.339 u[IU]/mL (ref 0.350–4.500)

## 2015-09-09 LAB — LIPID PANEL
CHOL/HDL RATIO: 4.3 ratio (ref <=5.0)
CHOLESTEROL: 188 mg/dL (ref 125–200)
HDL: 44 mg/dL — ABNORMAL LOW (ref >=46)
LDL Cholesterol: 121 mg/dL (ref <130)
TRIGLYCERIDES: 115 mg/dL (ref <150)
VLDL: 23 mg/dL (ref <30)

## 2015-09-09 LAB — GLUCOSE, POCT (MANUAL RESULT ENTRY): POC GLUCOSE: 149 mg/dL — AB (ref 70–99)

## 2015-09-09 LAB — MICROALBUMIN, URINE: MICROALB UR: 0.4 mg/dL

## 2015-09-09 MED ORDER — AMLODIPINE BESYLATE 10 MG PO TABS: 10.0000 mg | ORAL_TABLET | Freq: Every day | ORAL | Status: DC

## 2015-09-09 NOTE — Progress Notes (Signed)
Patient ID: Connie Russell, female   DOB: 1971-02-22, 45 y.o.   MRN: [id number] SUBJECTIVE: 45 y.o. female for follow up of diabetes. Diabetic Review of Systems - medication compliance: compliant all of the time, diabetic diet compliance: compliant all of the time, home glucose monitoring: is performed sporadically, further diabetic ROS: no polyuria or polydipsia, no chest pain, dyspnea or TIA's, no numbness, tingling or pain in extremities, no unusual visual symptoms, no hypoglycemia.  Last pap smear and mammogram--December 2016.   Current Outpatient Prescriptions  Medication Sig Dispense Refill  . amLODipine (NORVASC) 10 MG tablet Take 1 tablet (10 mg total) by mouth daily. 30 tablet 3  . aspirin EC 81 MG tablet Take 1 tablet (81 mg total) by mouth daily. 30 tablet 11  . cyclobenzaprine (FLEXERIL) 10 MG tablet Take 1 tablet (10 mg total) by mouth at bedtime. 30 tablet 0  . metFORMIN (GLUCOPHAGE) 500 MG tablet Take 1 tablet (500 mg total) by mouth 2 (two) times daily with a meal. 180 tablet 3  . omeprazole (PRILOSEC) 20 MG capsule Take 1 capsule (20 mg total) by mouth 2 (two) times daily before a meal. 60 capsule 3  . Aloe Vera 25 MG CAPS Take 1 capsule by mouth daily.    . Blood Glucose Monitoring Suppl (TRUERESULT BLOOD GLUCOSE) W/DEVICE KIT Check blood sugar TID & QHS 1 each 0  . diphenhydrAMINE (BENADRYL) 25 MG tablet Take 1 tablet (25 mg total) by mouth every 6 (six) hours as needed for itching. 30 tablet 0  . glucose blood (TRUETEST TEST) test strip Check blood sugar TID & QHS 100 each 12  . glucose blood test strip Check blood sugar TID & QHS 100 each 12  . glucose monitoring kit (FREESTYLE) monitoring kit 1 each by Does not apply route 4 (four) times daily - after meals and at bedtime. 1 month Diabetic Testing Supplies for QAC-QHS accuchecks. 1 each 1  . hydrocortisone 2.5 % cream Apply topically 2 (two) times daily. Apply to rash areas, do not apply to face 30 g 0  . polyethylene  glycol powder (GLYCOLAX/MIRALAX) powder Take 17 g by mouth 2 (two) times daily as needed. 3350 g 1  . traMADol (ULTRAM) 50 MG tablet Take 1 tablet (50 mg total) by mouth every 8 (eight) hours as needed for moderate pain. 30 tablet 0  . TRUEPLUS LANCETS 28G MISC Check blood sugar TID & QHS 100 each 2   No current facility-administered medications for this visit.   Review of system: Other than what is stated in HPI, all other systems are negative.   OBJECTIVE: Appearance: alert, well appearing, and in no distress, oriented to person, place, and time and normal appearing weight. BP 111/75 mmHg  Pulse 76  Temp(Src) 98 F (36.7 C)  Resp 16  Ht 5' 6"  (1.676 m)  Wt 143 lb (64.864 kg)  BMI 23.09 kg/m2  SpO2 100%  Exam: heart sounds normal rate, regular rhythm, normal S1, S2, no murmurs, rubs, clicks or gallops, no JVD, chest clear, no hepatosplenomegaly, no carotid bruits, feet: warm, good capillary refill, no trophic changes or ulcerative lesions, normal DP and PT pulses, normal monofilament exam and normal sensory exam  ASSESSMENT: Connie Russell was seen today for follow-up.  Diagnoses and all orders for this visit:  Type 2 diabetes mellitus without complication, with long-term current use of insulin (HCC) -     Glucose (CBG) -     HgB A1c -     COMPLETE  METABOLIC PANEL WITH GFR -     Lipid panel -     TSH -     Microalbumin, urine Patients diabetes is well control as evidence by consistently low a1c.  Patient will continue with current therapy and continue to make necessary lifestyle changes.  Reviewed foot care, diet, exercise, annual health maintenance with patient.   Essential hypertension, benign -     amLODipine (NORVASC) 10 MG tablet; Take 1 tablet (10 mg total) by mouth daily. Patient blood pressure is stable and may continue on current medication.  Education on diet, exercise, and modifiable risk factors discussed. Will obtain appropriate labs as needed. Will follow up in 3-6 months.     PLAN: See orders for this visit as documented in the electronic medical record. Issues reviewed with her: referral to Diabetic Education department, diabetic diet discussed in detail, written exchange diet given, low cholesterol diet, weight control and daily exercise discussed, foot care discussed and Podiatry visits discussed, annual eye examinations at Ophthalmology discussed and long term diabetic complications discussed.   Return in about 3 months (around 12/08/2015) for DM/HTN.  Lance Bosch, NP 09/09/2015 10:09 AM

## 2015-09-09 NOTE — Progress Notes (Signed)
Interpreter used Aura [phone] Patient here for follow up on her diabetes and HTN Requesting blood work to check her cholesterol as well

## 2015-09-10 ENCOUNTER — Telehealth: Payer: Self-pay

## 2015-09-10 NOTE — Telephone Encounter (Signed)
Interpreter line used Connie Russell ID# [id number] Called patient this am  Patient is aware of her lab results and to incorporate diet and exercise in her  Daily regiments

## 2015-09-10 NOTE — Telephone Encounter (Signed)
-----   Message from Ambrose Finland, NP sent at 09/10/2015  7:15 AM EST ----- Labs normal except Cholesterol is elevated. Please go over things that increase cholesterol levels such as breads pasta, rice, butters, fried foods, etc.Please send Lipitor 10 mg to take every evening with dinner. Please explain that high cholesterol places her at risk for stroke and heart disease

## 2015-09-16 ENCOUNTER — Ambulatory Visit (INDEPENDENT_AMBULATORY_CARE_PROVIDER_SITE_OTHER): Payer: Self-pay | Admitting: Nurse Practitioner

## 2015-09-16 ENCOUNTER — Encounter: Payer: Self-pay | Admitting: Nurse Practitioner

## 2015-09-16 VITALS — BP 119/74 | HR 89 | Temp 98.2°F | Ht 63.0 in | Wt 145.5 lb

## 2015-09-16 DIAGNOSIS — R109 Unspecified abdominal pain: Secondary | ICD-10-CM | POA: Insufficient documentation

## 2015-09-16 DIAGNOSIS — K2970 Gastritis, unspecified, without bleeding: Secondary | ICD-10-CM

## 2015-09-16 DIAGNOSIS — K5900 Constipation, unspecified: Secondary | ICD-10-CM

## 2015-09-16 DIAGNOSIS — R1084 Generalized abdominal pain: Secondary | ICD-10-CM

## 2015-09-16 MED ORDER — PANTOPRAZOLE SODIUM 40 MG PO TBEC: 40.0000 mg | DELAYED_RELEASE_TABLET | Freq: Every day | ORAL | Status: DC

## 2015-09-16 MED ORDER — LINACLOTIDE 145 MCG PO CAPS: 145.0000 ug | ORAL_CAPSULE | Freq: Every day | ORAL | Status: DC

## 2015-09-16 MED FILL — ?PANTOPRAZOLE SOD DR 40MG: 40 MG | 30 days supply | Qty: 30 | Fill #0

## 2015-09-16 NOTE — Assessment & Plan Note (Signed)
Patient with constipation as per history of present illness. At this point I will start her on Linzess 140 g per day and provide samples for 2 weeks. She is requested to call us with a progress report in approximately 1-1/2-2 weeks which point we can send in a prescription to the pharmacy if Linzess is working well. If it is not we can consider other options. Return for follow-up in 6 weeks.

## 2015-09-16 NOTE — Assessment & Plan Note (Signed)
Gastritis symptoms are improved on PPI. However, she feels Prilosec is causing excess belching, bloating. She is requesting a different PPI. At this point I will sever stop Prilosec and start Protonix 40 mg once a day. Return for follow-up in 6 weeks.

## 2015-09-16 NOTE — Progress Notes (Signed)
cc'ed to pcp °

## 2015-09-16 NOTE — Assessment & Plan Note (Signed)
Epigastric abdominal pain resolved. Lower abdominal pain and periumbilical abdominal pain remains. She also has associated constipation as noted above. We'll start her on Linzess 145 g a day and see if improvement of her constipation results and improved abdominal pain. Return for follow-up in 6 weeks.

## 2015-09-16 NOTE — Progress Notes (Signed)
Referring Provider: Lance Bosch, NP Primary Care Physician:  Lance Bosch, NP Primary GI:  Dr. Oneida Alar  Chief Complaint  Patient presents with  . Gastroesophageal Reflux    HPI:   45 year old female presents for follow-up on abdominal pain. Last seen in our office 06/13/2015 for the same. At that time she described her pain as upper abdominal and periumbilical, burning, worse after eating and occasionally radiating to her back. Recent ultrasound normal gallbladder, normal CBD. History of GERD on Prilosec for about a year once daily. At that point she was referred for upper endoscopy. EGD completed 06/24/2015 which found abdominal pain most likely due to constipation, GERD, and gastritis. Mild nonerosive gastritis. Recommended stop ibuprofen, use Tylenol, omeprazole twice daily, follow-up in 3 months to consider more aggressive bowel regimen, plus/minus TCS/Givens if abdominal pain not improved. Stomach biopsy found benign antral gastric mucosa with chronic active gastritis, small bowel duodenal biopsies benign. History of MRI July 2016 found no acute intra-abdominal process, constipation.  Patient's visit today assisted by language translator. Today she states she has a feeling of a ball in her stomach near her belly button. The Prilosec makes her feel bloated. Has a bowel movement about every 2-3 days. Stools are hard and require straining, "comes out like it has been stuck." Denies hematochezia and melena. States epigastric pain and esophageal burning are better. However, after taking Omeprazole feels like she gets a lot of gas and wants to try a different one. Denies chest pain, dyspnea, dizziness, lightheadedness, syncope, near syncope. Denies any other upper or lower GI symptoms.  Past Medical History  Diagnosis Date  . Hypertension   . GERD (gastroesophageal reflux disease)   . Diabetes [facility])     Past Surgical History  Procedure Laterality Date  . Foot surgery Left 2000   Local  . Esophagogastroduodenoscopy N/A 06/24/2015    SLF: 1. Abdominal pain most likely due to constipation, GERD, and Gastritis. 2. MIld NON-erosive gastritis.    Current Outpatient Prescriptions  Medication Sig Dispense Refill  . Aloe Vera 25 MG CAPS Take 1 capsule by mouth daily.    Marland Kitchen amLODipine (NORVASC) 10 MG tablet Take 1 tablet (10 mg total) by mouth daily. 30 tablet 3  . aspirin EC 81 MG tablet Take 1 tablet (81 mg total) by mouth daily. 30 tablet 11  . Blood Glucose Monitoring Suppl (TRUERESULT BLOOD GLUCOSE) W/DEVICE KIT Check blood sugar TID & QHS 1 each 0  . cyclobenzaprine (FLEXERIL) 10 MG tablet Take 1 tablet (10 mg total) by mouth at bedtime. 30 tablet 0  . glucose blood (TRUETEST TEST) test strip Check blood sugar TID & QHS 100 each 12  . glucose blood test strip Check blood sugar TID & QHS 100 each 12  . glucose monitoring kit (FREESTYLE) monitoring kit 1 each by Does not apply route 4 (four) times daily - after meals and at bedtime. 1 month Diabetic Testing Supplies for QAC-QHS accuchecks. 1 each 1  . hydrocortisone 2.5 % cream Apply topically 2 (two) times daily. Apply to rash areas, do not apply to face 30 g 0  . metFORMIN (GLUCOPHAGE) 500 MG tablet Take 1 tablet (500 mg total) by mouth 2 (two) times daily with a meal. 180 tablet 3  . omeprazole (PRILOSEC) 20 MG capsule Take 1 capsule (20 mg total) by mouth 2 (two) times daily before a meal. 60 capsule 3  . polyethylene glycol powder (GLYCOLAX/MIRALAX) powder Take 17 g by mouth 2 (two) times  daily as needed. 3350 g 1  . TRUEPLUS LANCETS 28G MISC Check blood sugar TID & QHS 100 each 2  . diphenhydrAMINE (BENADRYL) 25 MG tablet Take 1 tablet (25 mg total) by mouth every 6 (six) hours as needed for itching. (Patient not taking: Reported on 09/16/2015) 30 tablet 0  . traMADol (ULTRAM) 50 MG tablet Take 1 tablet (50 mg total) by mouth every 8 (eight) hours as needed for moderate pain. (Patient not taking: Reported on 09/16/2015) 30  tablet 0   No current facility-administered medications for this visit.    Allergies as of 09/16/2015  . (No Known Allergies)    Family History  Problem Relation Age of Onset  . Hypertension Mother   . Diabetes Maternal Grandmother   . Colon cancer Neg Hx     Social History   Social History  . Marital Status: Married    Spouse Name: N/A  . Number of Children: N/A  . Years of Education: N/A   Social History Main Topics  . Smoking status: Never Smoker   . Smokeless tobacco: Never Used  . Alcohol Use: No  . Drug Use: No  . Sexual Activity: Yes   Other Topics Concern  . None   Social History Narrative    Review of Systems: 10-point ROS negative except as per HPI.   Physical Exam: BP 119/74 mmHg  Pulse 89  Temp(Src) 98.2 F (36.8 C) (Oral)  Ht _0  (1.6 m)  Wt 145 lb 8 oz (65.998 kg)  BMI 25.78 kg/m2  LMP 09/09/2015 General:   Alert and oriented. Pleasant and cooperative. Well-nourished and well-developed.  Head:  Normocephalic and atraumatic. Cardiovascular:  S1, S2 present without murmurs appreciated. Extremities without clubbing or edema. Respiratory:  Clear to auscultation bilaterally. No wheezes, rales, or rhonchi. No distress.  Gastrointestinal:  +BS, soft, non-tender and non-distended. No HSM noted. No guarding or rebound. No masses appreciated.  Rectal:  Deferred  Musculoskalatal:  Symmetrical without gross deformities. Skin:  Intact without significant lesions or rashes.    09/16/2015 10:24 AM

## 2015-09-16 NOTE — Patient Instructions (Addendum)
English 1. Stop taking Prilosec. 2. I sent in a new prescription to your pharmacy for Protonix. Take 1 pill once a day, 30 minutes before your first meal of the day. 3. Start taking Linzess 145 g. Take 1 pill, once a day on an empty stomach. 4. Call us in 2 weeks and let us know if the Linzess is working. If it works we can send a prescription to your pharmacy. 5. Return for follow-up in 6 weeks.   Spanish 1. Deja de tomar Prilosec. 2. Le envi una nueva receta a su farmacia para Protonix. Tome 1 pldora una vez al da, 30 minutos antes de su primera comida del Futures trader. 3. Comience a tomar Linzess 145 ?g. Tome 1 pldora, una vez al da con el estmago vaco. 4. Llmenos en 2 semanas y hganos saber si el Linzess est funcionando. Si funciona, podemos enviar una receta a su farmacia. 5. Regreso para el seguimiento en 6 semanas.

## 2015-09-23 MED FILL — ?AMLODIPINE BESYLATE 10 MG: 10 | 30 days supply | Qty: 30 | Fill #3

## 2015-09-24 ENCOUNTER — Other Ambulatory Visit: Payer: Self-pay | Admitting: Pharmacist

## 2015-09-24 MED ORDER — TRUE METRIX METER W/DEVICE KIT: PACK | Status: DC

## 2015-09-24 MED ORDER — GLUCOSE BLOOD VI STRP: ORAL_STRIP | Status: DC

## 2015-09-24 MED ORDER — TRUEPLUS LANCETS 28G MISC: Status: DC

## 2015-09-24 MED FILL — !TRUE METRIX BLOOD GLUCOSE: 365 days supply | Qty: 1 | Fill #0

## 2015-09-24 MED FILL — TRUEplus LANCETS 28G MISC: 25 days supply | Qty: 100 | Fill #0

## 2015-09-24 MED FILL — TRUE METRIX TEST STRIP: 25 days supply | Qty: 100 | Fill #0

## 2015-10-07 MED FILL — metFORMIN HCL 500 MG TABS: 500 | 30 days supply | Qty: 60 | Fill #5

## 2015-10-09 ENCOUNTER — Encounter: Payer: Self-pay | Admitting: *Deleted

## 2015-10-14 ENCOUNTER — Ambulatory Visit: Payer: Self-pay | Attending: Internal Medicine

## 2015-10-28 MED FILL — PANTOPRAZOLE SOD DR 40 MG T: 40 | 30 days supply | Qty: 30 | Fill #1

## 2015-10-28 MED FILL — AMLODIPINE BESYLATE 10 MG T: 10 | 30 days supply | Qty: 30 | Fill #0

## 2015-10-31 ENCOUNTER — Ambulatory Visit (INDEPENDENT_AMBULATORY_CARE_PROVIDER_SITE_OTHER): Payer: Self-pay | Admitting: Gastroenterology

## 2015-10-31 ENCOUNTER — Encounter: Payer: Self-pay | Admitting: Gastroenterology

## 2015-10-31 VITALS — BP 114/77 | HR 82 | Temp 97.9°F | Wt 143.0 lb

## 2015-10-31 DIAGNOSIS — K5901 Slow transit constipation: Secondary | ICD-10-CM

## 2015-10-31 DIAGNOSIS — K219 Gastro-esophageal reflux disease without esophagitis: Secondary | ICD-10-CM

## 2015-10-31 DIAGNOSIS — R1084 Generalized abdominal pain: Secondary | ICD-10-CM

## 2015-10-31 DIAGNOSIS — K2970 Gastritis, unspecified, without bleeding: Secondary | ICD-10-CM

## 2015-10-31 MED ORDER — PANTOPRAZOLE SODIUM 40 MG PO TBEC: 40.0000 mg | DELAYED_RELEASE_TABLET | Freq: Every day | ORAL | Status: DC

## 2015-10-31 NOTE — Progress Notes (Signed)
CC'ED TO PCP 

## 2015-10-31 NOTE — Assessment & Plan Note (Signed)
SYMPTOMS FAIRLY WELL CONTROLLED ON MEDS.  CONTINUE PROTONIX. TAKE 30 MINUTES PRIOR TO BREAKFAST. FOLLOW UP IN 6 MOS.

## 2015-10-31 NOTE — Progress Notes (Signed)
ON RECALL  °

## 2015-10-31 NOTE — Assessment & Plan Note (Signed)
SYMPTOMS FAIRLY WELL CONTROLLED WITH JUICING/ALOE VERA/NOPAL.  DRINK WATER EAT FIBER CONTINUE JUICING FOLLOW UP IN 6 MOS.

## 2015-10-31 NOTE — Assessment & Plan Note (Addendum)
IN THE EPIGASTRIUM AND PERIUMBILICAL AND MOST LIKELY DUE TO CONSTIPATION AND SMALL UMBILICAL HERNIA. CLINICALLY IMPROVED.  DRINK WATER EAT FIBER CONTINUE JUICING FOLLOW UP IN 6 MOS.  REVIEWED IMAGING FROM 2016 TO PRESENT(MRI/U/S) WITH PATIENT.

## 2015-10-31 NOTE — Patient Instructions (Addendum)
CONTINUE USING JUICES, NOPAL, AND ALOE VERA.  DRINK WATER TO KEEP YOUR URINE LIGHT YELLOW.  FOLLOW A HIGH FIBER DIET. AVOID ITEMS THAT CAUSE BLOATING & GAS.   CONTINUE PROTONIX. TAKE 30 MINUTES PRIOR TO BREAKFAST.  FOLLOW UP IN 6 MOS.

## 2015-10-31 NOTE — Progress Notes (Signed)
   Subjective:    Patient ID: Connie Russell, female    DOB: 1971-04-22, 45 y.o.   MRN: [id number]  Ambrose FinlandValerie A Keck, NP  HPI   VIA INTERPRETER: MARIEL. the patient DOES NOT SPEAK ENGLISH.  JUICING IN HER BLENDER TO HELP BELLY: NOPAL/GARLIC/LEMON/ALOE VERA OR PAPAYA/ALOE VERA. HELPING WITH BOWEL AND FEEL LIKE SWELLING. ALWAYS PAIN IN EPIGASTRIUM AND IT'S ALWAYS THERE. FEELS A BALL MOVING. BMs: NOT EVERY DAY, QOD(#3-6). LINZESS CAUSED DIARRHEA 7 HEART RACING. TAKING PROTONIX BUT NEEDS REFILLS. MISSED DOSE FRI/SAT/SUN AND HAD HEARTBURN. SENSATION IN HER RECTUM, AND NOW GONE WITH JUICING.   PT DENIES FEVER, CHILLS, HEMATOCHEZIA, nausea, vomiting, melena, CHEST PAIN, SHORTNESS OF BREATH,  CHANGE IN BOWEL IN HABITS, OR problems swallowing.heartburn or indigestion.   Past Medical History  Diagnosis Date  . Hypertension   . GERD (gastroesophageal reflux disease)   . Diabetes [facility](HCC)    Past Surgical History  Procedure Laterality Date  . Foot surgery Left 2000    Local  . Esophagogastroduodenoscopy N/A 06/24/2015    SLF: 1. Abdominal pain most likely due to constipation, GERD, and Gastritis. 2. MIld NON-erosive gastritis.   No Known Allergies  Current Outpatient Prescriptions  Medication Sig Dispense Refill  . Aloe Vera 25 MG CAPS Take 1 capsule by mouth daily.    Marland Kitchen. amLODipine (NORVASC) 10 MG tablet Take 1 tablet (10 mg total) by mouth daily.    Marland Kitchen. aspirin EC 81 MG tablet Take 1 tablet (81 mg total) by mouth daily.    Marland Kitchen. BENADRYL 25 MG tablet Take 1 tablet (25 mg total) by mouth every 6 (six) hours as needed for itching.    Marland Kitchen. GLUCOPHAGE 500 MG tablet Take 1 tablet (500 mg total) PO BID with a meal.    . NON FORMULARY Vitamin D   One tablet daily    . NON FORMULARY Super Gymnema  One tablet daily    . PROTONIX 40 MG tablet Take 1 tablet (40 mg total) by mouth daily.    .      .      .      .      .      .      Marland Kitchen.  GLYCOLAX/MIRALAX powder Take 17 g by mouth 2 (two) times daily as  needed.    .      .       Review of Systems PER HPI OTHERWISE ALL SYSTEMS ARE NEGATIVE.     Objective:   Physical Exam  Constitutional: She is oriented to person, place, and time. She appears well-developed and well-nourished. No distress.  HENT:  Head: Normocephalic and atraumatic.  Mouth/Throat: Oropharynx is clear and moist. No oropharyngeal exudate.  Eyes: Pupils are equal, round, and reactive to light. No scleral icterus.  Neck: Normal range of motion. Neck supple.  Cardiovascular: Normal rate, regular rhythm and normal heart sounds.   Pulmonary/Chest: Effort normal and breath sounds normal. No respiratory distress.  Abdominal: Soft. Bowel sounds are normal. She exhibits no distension. There is no tenderness.  SMALL BULGE IN UMBILICUS  Musculoskeletal: She exhibits no edema.  Lymphadenopathy:    She has no cervical adenopathy.  Neurological: She is alert and oriented to person, place, and time.  NO FOCAL DEFICITS  Psychiatric: She has a normal mood and affect.  Vitals reviewed.     Assessment & Plan:

## 2015-11-11 MED FILL — ?METFORMIN HCL 500MG TABLET: 500 | 30 days supply | Qty: 60 | Fill #6

## 2015-11-29 MED FILL — AMLODIPINE BESYLATE 10 MG T: 10 | 30 days supply | Qty: 30 | Fill #1

## 2015-12-20 MED FILL — TRUE METRIX TEST STRIP: 25 days supply | Qty: 100 | Fill #1

## 2015-12-20 MED FILL — TRUEplus LANCETS 28G MISC: 25 days supply | Qty: 100 | Fill #1

## 2015-12-20 MED FILL — PANTOPRAZOLE SOD DR 40 MG T: 40 | 30 days supply | Qty: 30 | Fill #2

## 2015-12-20 MED FILL — ?METFORMIN HCL 500MG TABLET: 500 | 30 days supply | Qty: 60 | Fill #7

## 2015-12-25 MED FILL — AMLODIPINE BESYLATE 10 MG T: 10 | 30 days supply | Qty: 30 | Fill #2

## 2016-01-27 MED FILL — ?METFORMIN HCL 500MG TABLET: 500 | 30 days supply | Qty: 60 | Fill #8

## 2016-01-27 MED FILL — AMLODIPINE BESYLATE 10 MG T: 10 | 30 days supply | Qty: 30 | Fill #3

## 2016-02-24 ENCOUNTER — Encounter (HOSPITAL_COMMUNITY): Payer: Self-pay | Admitting: Emergency Medicine

## 2016-02-24 DIAGNOSIS — K429 Umbilical hernia without obstruction or gangrene: Secondary | ICD-10-CM | POA: Insufficient documentation

## 2016-02-24 DIAGNOSIS — I10 Essential (primary) hypertension: Secondary | ICD-10-CM | POA: Insufficient documentation

## 2016-02-24 DIAGNOSIS — E119 Type 2 diabetes mellitus without complications: Secondary | ICD-10-CM | POA: Insufficient documentation

## 2016-02-24 LAB — URINALYSIS, ROUTINE W REFLEX MICROSCOPIC
BILIRUBIN URINE: NEGATIVE
Glucose, UA: NEGATIVE mg/dL
Ketones, ur: NEGATIVE mg/dL
Leukocytes, UA: NEGATIVE
NITRITE: NEGATIVE
PROTEIN: NEGATIVE mg/dL
Specific Gravity, Urine: 1.01 (ref 1.005–1.030)
pH: 6.5 (ref 5.0–8.0)

## 2016-02-24 LAB — URINE MICROSCOPIC-ADD ON: WBC, UA: NONE SEEN WBC/hpf (ref 0–5)

## 2016-02-24 LAB — COMPREHENSIVE METABOLIC PANEL
ALK PHOS: 61 U/L (ref 38–126)
ALT: 19 U/L (ref 14–54)
ANION GAP: 7 (ref 5–15)
AST: 17 U/L (ref 15–41)
Albumin: 4.3 g/dL (ref 3.5–5.0)
BILIRUBIN TOTAL: 0.5 mg/dL (ref 0.3–1.2)
BUN: 13 mg/dL (ref 6–20)
CALCIUM: 9.2 mg/dL (ref 8.9–10.3)
CO2: 25 mmol/L (ref 22–32)
CREATININE: 0.66 mg/dL (ref 0.44–1.00)
Chloride: 103 mmol/L (ref 101–111)
Glucose, Bld: 156 mg/dL — ABNORMAL HIGH (ref 65–99)
Potassium: 3.3 mmol/L — ABNORMAL LOW (ref 3.5–5.1)
SODIUM: 135 mmol/L (ref 135–145)
TOTAL PROTEIN: 6.8 g/dL (ref 6.5–8.1)

## 2016-02-24 LAB — CBC
HCT: 37.2 % (ref 36.0–46.0)
HEMOGLOBIN: 13.3 g/dL (ref 12.0–15.0)
MCH: 31.9 pg (ref 26.0–34.0)
MCHC: 35.8 g/dL (ref 30.0–36.0)
MCV: 89.2 fL (ref 78.0–100.0)
PLATELETS: 271 10*3/uL (ref 150–400)
RBC: 4.17 MIL/uL (ref 3.87–5.11)
RDW: 12.3 % (ref 11.5–15.5)
WBC: 10.2 10*3/uL (ref 4.0–10.5)

## 2016-02-24 LAB — POC URINE PREG, ED: PREG TEST UR: NEGATIVE

## 2016-02-24 LAB — LIPASE, BLOOD: Lipase: 45 U/L (ref 11–51)

## 2016-02-24 NOTE — ED Notes (Signed)
Pt. reports mid abdominal pain with lump onset this week , mild nausea , denies emesis or diarrhea , no fever or chills.

## 2016-02-25 ENCOUNTER — Emergency Department (HOSPITAL_COMMUNITY)
Admission: EM | Admit: 2016-02-25 | Discharge: 2016-02-25 | Disposition: A | Discharge status: Home / Self Care | Payer: Self-pay | Attending: Emergency Medicine | Admitting: Emergency Medicine

## 2016-02-25 DIAGNOSIS — K429 Umbilical hernia without obstruction or gangrene: Secondary | ICD-10-CM

## 2016-02-25 HISTORY — DX: Gastritis, unspecified, without bleeding: K29.70

## 2016-02-25 HISTORY — DX: Type 2 diabetes mellitus without complications: E11.9

## 2016-02-25 NOTE — ED Provider Notes (Signed)
CSN: [id number]     Arrival date & time 02/24/16  2024 History  By signing my name below, I, Connie Russell, attest that this documentation has been prepared under the direction and in the presence of Laurence Spatesachel Morgan Little, MD. Electronically Signed: Bethel BornBritney Russell, ED Scribe. 02/25/2016. 1:59 AM   Chief Complaint  Patient presents with  . Abdominal Pain  . Hernia   The history is provided by the patient. A language interpreter was used.   Connie Russell is a 45 y.o. female with PMHx of gatsritis, DM, and HTN who presents to the Emergency Department complaining of , heavy,8/10 in severity, periumbilical abdominal pain with onset 1-2 years ago. The pain worsened this week and is exacerbated by walking and eating. However, when pain is absent, eating does not make the pain come on.  She has been evaluated by a GI specialist and told that she has a hernia and states that she is able to feel a little ball in her abdomen after eating. Associated symptoms include nausea. Pt denies vomiting, diarrhea, and dysuria. No fevers. No hx of abdominal surgery.  Pt is primarily Spanish-speaking, history provided with the assistance of a relative and a tele-interpreter.   Past Medical History  Diagnosis Date  . Diabetes mellitus without complication (HCC)   . Hypertension   . Gastritis    No past surgical history on file. No family history on file. Social History  Substance Use Topics  . Smoking status: Never Smoker   . Smokeless tobacco: Not on file  . Alcohol Use: No   OB History    No data available     Review of Systems  10 Systems reviewed and all are negative for acute change except as noted in the HPI.  Allergies  Review of patient's allergies indicates no known allergies.  Home Medications   Prior to Admission medications   Not on File   BP 119/77 mmHg  Pulse 65  Temp(Src) 98.6 F (37 C) (Oral)  Resp 16  Ht 5\' 1"  (1.549 m)  Wt 141 lb (63.957 kg)  BMI 26.66 kg/m2  SpO2  100%  LMP 02/24/2016 Physical Exam  Constitutional: She is oriented to person, place, and time. She appears well-developed and well-nourished. No distress.  HENT:  Head: Normocephalic and atraumatic.  Moist mucous membranes  Eyes: Conjunctivae are normal. Pupils are equal, round, and reactive to light.  Neck: Neck supple.  Cardiovascular: Normal rate, regular rhythm and normal heart sounds.   No murmur heard. Pulmonary/Chest: Effort normal and breath sounds normal.  Abdominal: Soft. Bowel sounds are normal. She exhibits no distension. There is no tenderness.  Small umbilical, mildly tender to palpation without any bowel protrusion  Musculoskeletal: She exhibits no edema.  Neurological: She is alert and oriented to person, place, and time.  Fluent speech  Skin: Skin is warm and dry.  Psychiatric: She has a normal mood and affect. Judgment normal.  Nursing note and vitals reviewed.   ED Course  Procedures (including critical care time) DIAGNOSTIC STUDIES: Oxygen Saturation is 100% on RA,  normal by my interpretation.    COORDINATION OF CARE: 1:58 AM Discussed treatment plan which includes lab work with pt at bedside and pt agreed to plan.  Labs Review Labs Reviewed  COMPREHENSIVE METABOLIC PANEL - Abnormal; Notable for the following:    Potassium 3.3 (*)    Glucose, Bld 156 (*)    All other components within normal limits  URINALYSIS, ROUTINE W REFLEX MICROSCOPIC (NOT AT  [facility]) - Abnormal; Notable for the following:    Color, Urine STRAW (*)    Hgb urine dipstick TRACE (*)    All other components within normal limits  URINE MICROSCOPIC-ADD ON - Abnormal; Notable for the following:    Squamous Epithelial / LPF 0-5 (*)    Bacteria, UA RARE (*)    All other components within normal limits  LIPASE, BLOOD  CBC  POC URINE PREG, ED    Imaging Review No results found. I have personally reviewed and evaluated these lab results as part of my medical decision-making.   EKG  Interpretation None      MDM   Final diagnoses:  Umbilical hernia without obstruction and without gangrene   Patient with intermittent periumbilical abdominal tenderness for 1-2 years that occurs randomly. No associated vomiting or diarrhea. She was well-appearing on exam. Vital signs unremarkable and above lab work was normal. She had a small defect in her abdominal wall consistent with an umbilical hernia without any protrusion of bowel or other contents. Based on this exam, I feel she is safe for d/c w/ gen surgery f/u. I have reviewed follow-up plan and have extensively reviewed return precautions with the patient as well as her daughter. All questions answered and patient voiced understanding. Patient discharged in satisfactory condition.  I personally performed the services described in this documentation, which was scribed in my presence. The recorded information has been reviewed and is accurate.    Laurence Spates, MD 02/25/16 [phone]

## 2016-02-25 NOTE — Discharge Instructions (Signed)
Hernia en los adultos (Hernia, Adult) Una hernia es la protrusin de un rgano o un tejido a travs de un punto dbil en los msculos del abdomen (pared abdominal). La mayora de las veces, las hernias aparecen cerca del ombligo o de la ingle. Hay muchos tipos de hernias. Los ms frecuentes incluyen los siguientes:  Hernia crural. Este tipo de hernia aparece por debajo de la ingle en la regin superior del muslo.  Hernia inguinal. Este tipo de hernia aparece en la ingle o en el escroto.  Hernia umbilical. Este tipo de hernia aparece cerca del ombligo.  Hernia de hiato. Este tipo de hernia produce el desplazamiento de una porcin del estmago hacia el trax.  Hernia incisional. Este tipo de hernia sobresale a travs de una cicatriz de una ciruga abdominal. CAUSAS Este trastorno puede ser causado por:  Levantar peso excesivo.  Toser durante mucho tiempo.  Dificultad para defecar.  Una incisin realizada durante una ciruga abdominal.  Un defecto congnito.  Sobrepeso u obesidad.  Fumar.  Dficit nutricional.  Fibrosis qustica.  Exceso de lquido en el abdomen.  Criptorquidia (testculos retenidos). SNTOMAS Los sntomas de una hernia incluyen lo siguiente:  Un bulto en el abdomen, que es el primer signo de una hernia. El bulto puede volverse ms evidente al estar de pie, hacer esfuerzos o toser. Puede aumentar de tamao con el tiempo si no se lo trata o si no se trata la afeccin que lo causa.  Dolor. Generalmente, las hernias son indoloras, pero pueden volverse dolorosas con el tiempo si se retrasa el tratamiento. El dolor suele ser sordo y puede ser ms intenso al estar de pie o levantar objetos pesados. A veces, una hernia queda constreida en el punto dbil (estrangulada) o atascada (encarcelada) y causa ms sntomas. Estos sntomas pueden incluir los siguientes:  Vmitos.  Nuseas.  Estreimiento.  Irritabilidad. DIAGNSTICO El diagnstico de una hernia  puede realizarse mediante lo siguiente:  Un examen fsico. Durante el examen, el mdico puede pedirle que tosa o haga un movimiento especfico, porque, por lo general, una hernia es ms visible cuando la persona se mueve.  Diagnstico por imgenes. Estos pueden incluir los siguientes:  Radiografas.  Ecografa.  Tomografa computarizada. TRATAMIENTO Es posible que una hernia pequea e indolora no necesite tratamiento. Una hernia grande o dolorosa puede tratarse con ciruga. Para tratar las hernias inguinales, se puede recurrir a una ciruga para evitar el encarcelamiento o la estrangulacin. Las hernias estranguladas siempre se tratan con ciruga, porque la falta de irrigacin sangunea al rgano o al tejido atascados puede causar su muerte. La ciruga para tratar una hernia incluye volver a colocar el bulto en su lugar y reparar la zona dbil del abdomen. INSTRUCCIONES PARA EL CUIDADO EN EL HOGAR  No haga esfuerzos.  No levante ningn objeto que pese ms de 10libras (4,5kg).  Para hacerlo, use los msculos de las piernas, no los de la espalda. Esto ayuda a evitar las sobrecargas.  Cuando tosa, hgalo con suavidad.  Evitar el estreimiento. El estreimiento obliga a realizar esfuerzos de defecacin, los cuales pueden agravar una hernia o causar la rotura de la reparacin. Para evitar el estreimiento, puede hacer lo siguiente:  Consuma una dieta con alto contenido de fibras que incluya gran cantidad de frutas y verduras.  Beba suficiente lquido para mantener la orina clara o de color amarillo plido. Pngase como objetivo beber 6 u 8vasos de agua por da.  Tome un ablandador de heces como se lo haya indicado el   mdico.  Baje de peso, si tiene sobrepeso.  No consuma ningn producto que contenga tabaco, lo que incluye cigarrillos, tabaco de mascar o cigarrillos electrnicos. Si necesita ayuda para dejar de fumar, consulte al mdico.  Concurra a todas las visitas de control como  se lo haya indicado el mdico. Esto es importante. Es posible que el mdico deba controlar su cuadro clnico. SOLICITE ATENCIN MDICA SI:  Tiene enrojecimiento, hinchazn o dolor en la zona afectada.  Sus hbitos intestinales han cambiado. SOLICITE ATENCIN MDICA DE INMEDIATO SI:  Tiene fiebre.  Tiene dolor abdominal que empeora.  Siente nuseas o vomita.  No puede volver a colocar la hernia en su lugar al ejercer sobre esta una presin suave mientras est acostado.  La hernia:  Cambia de forma o de tamao.  Queda atascada fuera del abdomen.  Cambia de color.  Est dura al tacto o le causa dolor a la palpacin.   Esta informacin no tiene como fin reemplazar el consejo del mdico. Asegrese de hacerle al mdico cualquier pregunta que tenga.   Document Released: 07/27/2005 Document Revised: 08/17/2014 Elsevier Interactive Patient Education 2016 [employer]  

## 2016-02-27 MED FILL — ?AMLODIPINE BESYLATE 10 MG: 10 | 30 days supply | Qty: 30 | Fill #0

## 2016-03-03 ENCOUNTER — Ambulatory Visit: Payer: Self-pay | Attending: Internal Medicine

## 2016-03-11 ENCOUNTER — Encounter: Payer: Self-pay | Admitting: Gastroenterology

## 2016-03-11 MED FILL — ?PANTOPRAZOLE SOD DR 40MG: 40 MG | 30 days supply | Qty: 30 | Fill #3

## 2016-03-11 MED FILL — metFORMIN HCL 500 MG TABS: 500 | 30 days supply | Qty: 60 | Fill #1

## 2016-03-18 ENCOUNTER — Encounter: Payer: Self-pay | Admitting: Internal Medicine

## 2016-03-18 ENCOUNTER — Ambulatory Visit: Payer: Self-pay | Attending: Internal Medicine | Admitting: Internal Medicine

## 2016-03-18 VITALS — BP 111/71 | HR 77 | Temp 98.6°F | Resp 16 | Wt 139.8 lb

## 2016-03-18 DIAGNOSIS — K5901 Slow transit constipation: Secondary | ICD-10-CM

## 2016-03-18 DIAGNOSIS — Z23 Encounter for immunization: Secondary | ICD-10-CM

## 2016-03-18 DIAGNOSIS — Z79899 Other long term (current) drug therapy: Secondary | ICD-10-CM | POA: Insufficient documentation

## 2016-03-18 DIAGNOSIS — Z833 Family history of diabetes mellitus: Secondary | ICD-10-CM | POA: Insufficient documentation

## 2016-03-18 DIAGNOSIS — E118 Type 2 diabetes mellitus with unspecified complications: Secondary | ICD-10-CM | POA: Insufficient documentation

## 2016-03-18 DIAGNOSIS — R42 Dizziness and giddiness: Secondary | ICD-10-CM | POA: Insufficient documentation

## 2016-03-18 DIAGNOSIS — K429 Umbilical hernia without obstruction or gangrene: Secondary | ICD-10-CM | POA: Insufficient documentation

## 2016-03-18 DIAGNOSIS — Z8249 Family history of ischemic heart disease and other diseases of the circulatory system: Secondary | ICD-10-CM | POA: Insufficient documentation

## 2016-03-18 DIAGNOSIS — K5909 Other constipation: Secondary | ICD-10-CM | POA: Insufficient documentation

## 2016-03-18 DIAGNOSIS — K219 Gastro-esophageal reflux disease without esophagitis: Secondary | ICD-10-CM | POA: Insufficient documentation

## 2016-03-18 DIAGNOSIS — E119 Type 2 diabetes mellitus without complications: Secondary | ICD-10-CM | POA: Insufficient documentation

## 2016-03-18 DIAGNOSIS — E559 Vitamin D deficiency, unspecified: Secondary | ICD-10-CM | POA: Insufficient documentation

## 2016-03-18 DIAGNOSIS — Z800 Family history of malignant neoplasm of digestive organs: Secondary | ICD-10-CM | POA: Insufficient documentation

## 2016-03-18 DIAGNOSIS — Z7982 Long term (current) use of aspirin: Secondary | ICD-10-CM | POA: Insufficient documentation

## 2016-03-18 DIAGNOSIS — I10 Essential (primary) hypertension: Secondary | ICD-10-CM | POA: Insufficient documentation

## 2016-03-18 LAB — CBC WITH DIFFERENTIAL/PLATELET
BASOS PCT: 0 %
Basophils Absolute: 0 cells/uL (ref 0–200)
EOS ABS: 158 {cells}/uL (ref 15–500)
Eosinophils Relative: 2 %
HCT: 41.4 % (ref 35.0–45.0)
Hemoglobin: 14.5 g/dL (ref 11.7–15.5)
Lymphocytes Relative: 28 %
Lymphs Abs: 2212 cells/uL (ref 850–3900)
MCH: 31.4 pg (ref 27.0–33.0)
MCHC: 35 g/dL (ref 32.0–36.0)
MCV: 89.6 fL (ref 80.0–100.0)
MONOS PCT: 6 %
MPV: 9 fL (ref 7.5–12.5)
Monocytes Absolute: 474 cells/uL (ref 200–950)
NEUTROS ABS: 5056 {cells}/uL (ref 1500–7800)
Neutrophils Relative %: 64 %
PLATELETS: 337 10*3/uL (ref 140–400)
RBC: 4.62 MIL/uL (ref 3.80–5.10)
RDW: 12.7 % (ref 11.0–15.0)
WBC: 7.9 10*3/uL (ref 3.8–10.8)

## 2016-03-18 LAB — POCT GLYCOSYLATED HEMOGLOBIN (HGB A1C): Hemoglobin A1C: 6

## 2016-03-18 MED ORDER — METFORMIN HCL 500 MG PO TABS: 500.0000 mg | ORAL_TABLET | Freq: Two times a day (BID) | ORAL | 3 refills | Status: DC

## 2016-03-18 MED ORDER — MECLIZINE HCL 12.5 MG PO TABS: 12.5000 mg | ORAL_TABLET | Freq: Three times a day (TID) | ORAL | 0 refills | Status: DC | PRN

## 2016-03-18 MED ORDER — AMLODIPINE BESYLATE 10 MG PO TABS: 10.0000 mg | ORAL_TABLET | Freq: Every day | ORAL | 3 refills | Status: DC

## 2016-03-18 MED ORDER — METFORMIN HCL 500 MG PO TABS
500.0000 mg | ORAL_TABLET | Freq: Every day | ORAL | 3 refills | Status: DC
Start: 2016-03-18 — End: 2017-01-01

## 2016-03-18 MED FILL — TRAVEL SICKNESS 25 MG TAB C: 25 | 5 days supply | Qty: 15 | Fill #0

## 2016-03-18 NOTE — Patient Instructions (Addendum)
La diabetes mellitus y los alimentos (Diabetes Mellitus and Food) Es importante que controle su nivel de azcar en la sangre (glucosa). El nivel de glucosa en sangre depende en gran medida de lo que usted come. Comer alimentos saludables en las cantidades [place] a lo largo del Training and development officer, aproximadamente a la misma hora [employer], lo ayudar a Chief Technology Officer su nivel de Multimedia programmer. Tambin puede ayudarlo a retrasar o Patent attorney de la diabetes mellitus. Comer de Affiliated Computer Services saludable incluso puede ayudarlo a Chartered loss adjuster de presin arterial y a Science writer o Theatre manager un peso saludable.  Entre las recomendaciones generales para alimentarse y Audiological scientist los alimentos de forma saludable, se incluyen las siguientes:  Respetar las comidas principales y comer colaciones con regularidad. Evitar pasar largos perodos sin comer con el fin de perder peso.  Seguir una dieta que consista principalmente en alimentos de origen vegetal, como frutas, vegetales, frutos secos, legumbres y cereales integrales.  Utilizar mtodos de coccin a baja temperatura, como hornear, en lugar de mtodos de coccin a alta temperatura, como frer en abundante aceite. Trabaje con el nutricionista para aprender a Financial planner nutricional de las etiquetas de los alimentos. CMO PUEDEN AFECTARME LOS ALIMENTOS? Carbohidratos Los carbohidratos afectan el nivel de glucosa en sangre ms que cualquier otro tipo de alimento. El nutricionista lo ayudar a Teacher, adult education cuntos carbohidratos puede consumir en cada comida y ensearle a contarlos. El recuento de carbohidratos es importante para mantener la glucosa en sangre en un nivel saludable, en especial si utiliza insulina o toma determinados medicamentos para la diabetes mellitus. Alcohol El alcohol puede provocar disminuciones sbitas de la glucosa en sangre (hipoglucemia), en especial si utiliza insulina o toma determinados medicamentos para la diabetes mellitus. La  hipoglucemia es una afeccin que puede poner en peligro la vida. Los sntomas de la hipoglucemia (somnolencia, mareos y Data processing manager) son similares a los sntomas de haber consumido mucho alcohol.  Si el mdico lo autoriza a beber alcohol, hgalo con moderacin y siga estas pautas:  Las mujeres no deben beber ms de un trago por da, y los hombres no deben beber ms de dos tragos por Training and development officer. Un trago es igual a:  12 onzas (355 ml) de cerveza  5 onzas de vino (150 ml) de vino  1,5onzas (23m) de bebidas espirituosas  No beba con el estmago vaco.  Mantngase hidratado. Beba agua, gaseosas dietticas o t helado sin azcar.  Las gaseosas comunes, los jugos y otros refrescos podran contener muchos carbohidratos y se dCivil Service fast streamer QU ALIMENTOS NO SE RECOMIENDAN? Cuando haga las elecciones de alimentos, es importante que recuerde que todos los alimentos son distintos. Algunos tienen menos nutrientes que otros por porcin, aunque podran tener la misma cantidad de caloras o carbohidratos. Es difcil darle al cuerpo lo que necesita cuando consume alimentos con menos nutrientes. Estos son algunos ejemplos de alimentos que debera evitar ya que contienen muchas caloras y carbohidratos, pero pocos nutrientes:  GPhysicist, medicaltrans (la mayora de los alimentos procesados incluyen grasas trans en la etiqueta de Informacin nutricional).  Gaseosas comunes.  Jugos.  Caramelos.  Dulces, como tortas, pasteles, rosquillas y g[place] Valleys  Comidas fritas. QU ALIMENTOS PUEDO COMER? Consuma alimentos ricos en nutrientes, que nutrirn el cuerpo y lo mantendrn saludable. Los alimentos que debe comer tambin dependern de varios factores, como:  Las caloras que necesita.  Los medicamentos que toma.  Su peso.  El nivel de glucosa en sMarist College  El nArrow Rockde presin arterial.  El nivel de colesterol.  Debe consumir una amplia variedad de alimentos, por ejemplo:  Protenas.  Cortes de PPL Corporationcarne  magros.  Protenas con bajo contenido de grasas saturadas, como pescado, clara de huevo y frijoles. Evite las carnes procesadas.  Frutas y vegetales.  Frutas y Sports administratorvegetales que pueden ayudar a Chief Operating Officercontrolar los niveles sanguneos de [place], como Makemie Parkmanzanas, mangos y batatas.  Productos lcteos.  Elija productos lcteos sin grasa o con bajo contenido de [place], como [place], yogur y [place]o.  Cereales, panes, pastas y arroz.  Elija cereales integrales, como panes multicereales, avena en grano y arroz integral. Estos alimentos pueden ayudar a controlar la presin arterial.  Rosalin HawkingGrasas.  Alimentos que contengan grasas saludables, como frutos secos, Chartered certified accountantaguacate, aceite de Holualoaoliva, aceite de canola y pescado. TODOS LOS QUE PADECEN DIABETES MELLITUS TIENEN EL MISMO PLAN DE COMIDAS? Dado que todas las personas que padecen diabetes mellitus son distintas, no hay un solo plan de comidas que funcione para todos. Es muy importante que se rena con un nutricionista que lo ayudar a crear un plan de comidas adecuado para usted.   Esta informacin no tiene Theme park managercomo fin reemplazar el consejo del mdico. Asegrese de hacerle al mdico cualquier pregunta que tenga.   Document Released: 11/03/2007 Document Revised: 08/17/2014 Elsevier Interactive Patient Education 2016 Arvin[employer]vier Inc.  -   Consejos para comer fuera de su casa si tiene diabetes (Tips for Eating Away From Home If You Have Diabetes) El control del nivel de glucemia, que tambin se conoce como azcar en la Harbour Heightssangre, puede ser un reto, que se complica an ms cuando uno no prepara sus propias comidas. Los siguientes consejos pueden ayudarlo a Chief Operating Officercontrolar la diabetes cuando come fuera de su casa. PLANIFICACIN Organcese si sabe que comer fuera de su casa:  Pregntele al mdico cmo sincronizar las comidas y el medicamento si est en tratamiento con insulina.  Haga una lista de restaurantes cercanos que ofrezcan opciones saludables. Si tienen un men que pueda  leer en su casa, llvelo y planifique lo que pedir con anticipacin.  Busque informacin en lnea del restaurante donde quiera comer. Muchos restaurantes de comida rpida y cadenas de restaurantes incluyen la informacin nutricional en lnea. Tenga en cuenta esta informacin para elegir las opciones ms saludables y calcular los carbohidratos de la comida.  Use un libro de recuento de carbohidratos o una aplicacin mvil para fijarse en el contenido de carbohidratos y el tamao de porcin de lo que desea comer.  Comience a Armed forces training and education officerconocer los tamaos de las porciones y a Public house managerreconocer cuntas porciones hay en una unidad. Esto le permitir calcular la cantidad de carbohidratos que puede comer. ALIMENTOS LIBRES Un "alimento libre" es cualquier alimento o bebida que contenga menos de 5g de carbohidratos por porcin. Entre los alimentos libres, se incluyen los siguientes:  Muchos vegetales.  Huevos duros.  Nueces o semillas.  Aceitunas.  Quesos.  Carnes. Estos tipos de alimentos son buenas opciones de bocadillos y en general estn disponibles en los bufs de ensaladas. Como aderezos "libres" para Sweet Homeensaladas, puede usarse jugo de limn, vinagre o un aderezo de bajas caloras (con menos de 20caloras por porcin).  OPCIONES PARA REDUCIR LOS CARBOHIDRATOS  Reemplace el yogur descremado endulzado por el yogur sin azcar. Tambin puede consumir yogur a base de [place] de [place]ivarsoja, pero es conveniente una opcin sin azcar o natural, porque tiene menos contenido de carbohidratos.  Pdale al mozo que retire la canasta de pan o las papas de la mesa.  Pida frutas frescas. El buf de ensaladas a menudo  ofrece frutas frescas. Evite las frutas enlatadas, ya que por lo general tienen azcar o almbar.  Pida una ensalada y cmala sin aderezo. Tambin puede crear un aderezo "libre" para ensaladas.  Pida que le [employer] Media alimentos. Por ejemplo, en lugar de papas fritas, pida una porcin de vegetales, como una  ensalada, judas verdes o brcoli. OTROS CONSEJOS   Si [place] insulina, adminstrela una vez que la comida llegue a la mesa, as las Automotive engineer.  Pregntele al mozo sobre el tamao de la porcin antes de pedir la comida y, si la porcin es ms grande de lo que usted debe consumir, pida una caja para llevarse la comida a su casa. Cuando llegue la comida, deje en el plato la cantidad que debe comer y coloque el resto en la caja para llevar.  Considere la posibilidad de Agricultural consultant un plato principal con alguien y de pedir una ensalada como guarnicin.   Esta informacin no tiene Theme park manager el consejo del mdico. Asegrese de hacerle al mdico cualquier pregunta que tenga.   Document Released: 07/27/2005 Document Revised: 04/17/2015 Elsevier Interactive Patient Education 2016 Arvin[employer]itor.  -  Plan de alimentacin con bajo contenido de sodio (Low-Sodium Eating Plan) El sodio aumenta la presin arterial y hace que el cuerpo retenga lquidos. El consumo de alimentos con menos sodio ayuda a Conservator, museum/gallery presin arterial, a Building services engineer y a Physicist, medical, el hgado y los riones. Agregar sal (cloruro de sodio) a los alimentos aumenta el aporte de [place]. La mayor parte del sodio proviene de los alimentos enlatados, envasados y congelados. La pizza, la comida rpida y la comida de los restaurantes tambin contienen mucho sodio. Aunque usted tome medicamentos para bajar la presin arterial o reducir el lquido del cuerpo, es importante que disminuya el aporte de sodio de los alimentos. EN QU CONSISTE EL PLAN? La Harley-Davidson de las personas deberan limitar la ingesta de sodio a 2300mg  por Futures trader. El mdico le recomienda que limite su consumo de sodio a 2 gram por da.  QU DEBO SABER ACERCA DE ESTE PLAN DE ALIMENTACIN? Para el plan de alimentacin con bajo contenido de sodio, debe seguir estas pautas generales:  Elija alimentos con un valor porcentual diario de sodio de  menos del 5% (segn se indica en la etiqueta).  Use hierbas o aderezos sin sal, en lugar de sal de mesa o sal marina.  Consulte al mdico o farmacutico antes de usar sustitutos de la sal.  Coma alimentos frescos.  Coma ms frutas y verduras.  Limite las verduras enlatadas. Si las consume, enjuguelas bien para disminuir el sodio.  Limite el consumo de queso a 1onza (28g) por Futures trader.  Coma productos con bajo contenido de sodio, cuya etiqueta suele decir "bajo contenido de sodio" o "sin agregado de sal".  Evite alimentos que contengan glutamato monosdico (MSG), que a veces se agrega a la comida [place] y a algunos alimentos enlatados.  Consulte las etiquetas de los alimentos (etiquetas de informacin nutricional) para saber cunto sodio contiene una porcin.  Consuma ms comida casera y menos de restaurante, de buf y comida rpida.  Cuando coma en un restaurante, pida que preparen su comida con menos sal o, en lo posible, sin nada de sal. CMO LEO LA INFORMACIN SOBRE EL SODIO EN LAS ETIQUETAS DE LOS ALIMENTOS? La etiqueta de informacin nutricional indica la cantidad de sodio en una porcin de alimento. Si come ms de Aflac Incorporated, debe multiplicar la cantidad indicada de sodio por  la cantidad de porciones. Las etiquetas de los alimentos tambin pueden indicar lo siguiente:  Sin sodio: menos de  por porcin.  Cantidad muy baja de sodio:  o menos por porcin.  Cantidad baja de sodio:  o menos por porcin.  Menor cantidad de sodio: 50% menos de sodio en una porcin. Por ejemplo, si un alimento generalmente contiene 300 mg de sodio se modifica para ser Edison International, tendr 150 mg de sodio.  Sodio reducido: 25% menos de Industrial/product designer. Por ejemplo, si un alimento que por lo general contiene  de sodio se modifica para convertirse en un alimento de sodio reducido, tendr  de sodio. QU ALIMENTOS PUEDO COMER? Cereales Cereales con bajo  contenido de sodio, como [place], arroz y trigo [place], y trigo triturado. Galletas con bajo contenido de [place]. Arroz y pastas sin sal. Pan con bajo contenido de [place].  Verduras Verduras frescas o congeladas. Verduras enlatadas con bajo contenido de sodio o reducido de sodio. Pasta y salsa de tomate con contenido bajo o reducido de sodio. Jugos de tomate y verduras con contenido bajo o reducido de sodio.  Nils Pyle Frutas frescas, congeladas y Primary school teacher. Jugo de frutas.  Carnes y otros productos con protenas Atn y salmn enlatado con bajo contenido de [place]. Carne de vaca o ave, pescado y frutos de mar frescos o congelados. Cordero. Frutos secos sin sal. Lentejas, frijoles y guisantes secos, sin sal agregada. Frijoles enlatados sin sal. Sopas caseras sin sal. Huevos.  Lcteos Leche. Leche de soja. Queso ricota. Quesos con contenido bajo o reducido de sodio. Yogur.  Condimentos Hierbas y especias frescas y secas. Aderezos sin sal. Cebolla y ajo en polvo. Variedades de mostaza y ketchup con bajo contenido de [employer]. Rbano picante fresco o refrigerado. Jugo de limn.  Grasas y aceites Aderezos para ensalada con contenido reducido de [place]. Mantequilla sin sal.  Otros Palomitas de maz y pretzels sin sal.  Los artculos mencionados arriba pueden no ser Raytheon de las bebidas o los alimentos recomendados. Comunquese con el nutricionista para conocer ms opciones. QU ALIMENTOS NO SE RECOMIENDAN? Cereales  Cereales instantneos para comer caliente. Mezclas para bizcochos, panqueques y rellenos de pan. Crutones. Mezclas para pastas o arroz con condimento. Envases comerciales de sopa de fideos. Macarrones con queso envasados o congelados. Harina leudante. Galletas saladas comunes. Verduras Verduras enlatadas comunes. Pasta y salsa de tomate en lata comunes. Jugos comunes de tomate y de verduras. Verduras Hydrologist. Papas fritas saladas. Aceitunas. Pepinillos. Salsas. Chucrut.  Salsa. Carnes y otros productos con protenas Carne de vaca, pescado o frutos de mar que est salada, [place], [place], condimentada con especias o con pickles. Panceta, jamn, salchichas, perros calientes, carne curada, carne picada (carne envasada de buey) y embutidos. Cerdo salado. Cecina o charqui. Arenque en escabeche. Anchoas, atn enlatado comn y sardinas. Frutos secos con sal. Celine Mans para untar y quesos procesados. Requesn. Queso azul y cottage. Suero de [place].  Condimentos Sal de cebolla y ajo, sal condimentada, sal de mesa y sal marina. Salsas en lata y envasadas. Salsa [place]. Salsa trtara. Salsa barbacoa. Salsa teriyaki. Salsa de soja, incluso la que tiene contenido reducido de [place]. Salsa de carne. Salsa de pescado. Salsa de [place]. Salsa rosada. Rbano picante envasado. Ketchup y mostaza comunes. Saborizantes y tiernizantes para carne. Caldo en cubitos. Salsa picante. Salsa tabasco. Adobos. Aderezos para tacos. Salsas. Grasas y aceites Aderezos comunes para ensalada. Mantequilla con sal. Margarina. Mantequilla clarificada. Grasa de panceta.  Otros Nachos  y papas fritas envasadas. Maz inflado y frituras de maz. Palomitas de maz y pretzels con sal. Sopas enlatadas o en polvo. Pizza. Pasteles y entradas congeladas.  Los artculos mencionados arriba pueden no ser Raytheon de las bebidas y los alimentos que se Theatre stage manager. Comunquese con el nutricionista para obtener ms informacin.   Esta informacin no tiene Theme park manager el consejo del mdico. Asegrese de hacerle al mdico cualquier pregunta que tenga.   Document Released: 07/27/2005 Document Revised: 08/17/2014 Elsevier Interactive Patient Education [employer]

## 2016-03-18 NOTE — Progress Notes (Addendum)
Connie Russell, is a 45 y.o. female  OYD:[id number]  [id number]:[id number]  DOB - [birth year]  CC:  Chief Complaint  Patient presents with  . Establish Care  . Hernia       HPI: Connie Russell is a 45 y.o. female Hispanic here today to establish medical care, last seen in clinic 1/17, w/ significant pmhx of htn, gerd, dm, hx of gastritis on egd 11/16, chronic constipation for which she sees GI for.    Here w/ several of her young children.  She has c/o of occasional dizziness, has happened 2 times.  First time occurred when she lived out of town, transient,  Felt lightheaded/dizzy, and than started having frontal headache.  Recently on Aug 2, she had similar sx, but no ha.  Specifics were difficult to obtain w/ Spanish interpreter though, but it sounded like room was spinning. No loc/cp/palpitations.  Per pt, she was seen in ED on February 17, 2016 for abd pain, noted to have umbilical hernia, I do not see any documentation of it in our system.  Per pt, no issues of n/v/indigestion/diarrhea w. Metformin.  Of note, pt had papsmear in 12/16 at [facility] or [facility], I could not determine it w/ interpreter assistance.  Pap smear nml at time.  Does not smoke or drink.   Per pt, she has had tdap, but not pneumonia shot. Never had eye exam.  Patient has No headache, No chest pain, No abdominal pain - No Nausea, No new weakness tingling or numbness, No Cough - SOB.  Interpreter was used to communicate directly with patient for the entire encounter including providing detailed patient instructions.   Review of Systems: Per HPI, o/w all systems reviewed and negative.   Allergies  Allergen Reactions  . Linzess [Linaclotide]     DIARRHEA AT 145 MICROG   Past Medical History:  Diagnosis Date  . Diabetes ([place])   . GERD (gastroesophageal reflux disease)   . Hypertension    Current Outpatient Prescriptions on File Prior to Visit  Medication Sig  Dispense Refill  . Aloe Vera 25 MG CAPS Take 1 capsule by mouth daily.    Marland Kitchen aspirin EC 81 MG tablet Take 1 tablet (81 mg total) by mouth daily. 30 tablet 11  . Blood Glucose Monitoring Suppl (TRUE METRIX METER) w/Device KIT CHECK BLOOD SUGAR TID AND QHS 1 kit 0  . diphenhydrAMINE (BENADRYL) 25 MG tablet Take 1 tablet (25 mg total) by mouth every 6 (six) hours as needed for itching. 30 tablet 0  . glucose blood (TRUE METRIX BLOOD GLUCOSE TEST) test strip CHECK BLOOD SUGAR TID AND QHS 100 each 12  . cyclobenzaprine (FLEXERIL) 10 MG tablet Take 1 tablet (10 mg total) by mouth at bedtime. (Patient not taking: Reported on 10/31/2015) 30 tablet 0  . hydrocortisone 2.5 % cream Apply topically 2 (two) times daily. Apply to rash areas, do not apply to face (Patient not taking: Reported on 10/31/2015) 30 g 0  . NON FORMULARY Vitamin D   One tablet daily    . NON FORMULARY Super Gymnema  One tablet daily    . pantoprazole (PROTONIX) 40 MG tablet Take 1 tablet (40 mg total) by mouth daily. 90 tablet 3  . polyethylene glycol powder (GLYCOLAX/MIRALAX) powder Take 17 g by mouth 2 (two) times daily as needed. 3350 g 1  . traMADol (ULTRAM) 50 MG tablet Take 1 tablet (50 mg total) by mouth every 8 (eight) hours  as needed for moderate pain. (Patient not taking: Reported on 09/16/2015) 30 tablet 0  . TRUEPLUS LANCETS 28G MISC CHECK BLOOD SUGAR TID AND QHS 100 each 5   No current facility-administered medications on file prior to visit.    Family History  Problem Relation Age of Onset  . Hypertension Mother   . Diabetes Maternal Grandmother   . Colon cancer Neg Hx    Social History   Social History  . Marital status: Married    Spouse name: N/A  . Number of children: N/A  . Years of education: N/A   Occupational History  . Not on file.   Social History Main Topics  . Smoking status: Never Smoker  . Smokeless tobacco: Never Used     Comment: Never smoked  . Alcohol use No  . Drug use: No  . Sexual  activity: Yes   Other Topics Concern  . 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    Objective:   Vitals:   03/18/16 1208  BP: 111/71  Pulse: 77  Resp: 16  Temp: 98.6 F (37 C)    Filed Weights   03/18/16 1208  Weight: 139 lb 12.8 oz (63.4 kg)    BP Readings from Last 3 Encounters:  03/18/16 111/71  10/31/15 114/77  09/16/15 119/74    Physical Exam: Constitutional: Patient appears well-developed and well-nourished. No distress. AAOx3, pleasant.  HENT: Normocephalic, atraumatic, External right and left ear normal. Oropharynx is clear and moist. bilat TMs clear. Eyes: Conjunctivae and EOM are normal. PERRL, no scleral icterus. Neck: Normal ROM. Neck supple. No JVD.  CVS: RRR, S1/S2 +, no murmurs, no gallops, no carotid bruit.  Pulmonary: Effort and breath sounds normal, no stridor, rhonchi, wheezes, rales.  Abdominal: Soft. BS +, small umbilical hernia, easily reducible, no ttp. no distension, rebound or guarding.  Musculoskeletal: Normal range of motion. No edema and no tenderness.  LE: bilat/ no c/c/e, pulses 2+ bilateral. Neuro: Alert.  muscle tone coordination wnl. No cranial nerve deficit grossly. Skin: Skin is warm and dry. No rash noted. Not diaphoretic. No erythema. No pallor. Psychiatric: Normal mood and affect. Behavior, judgment, thought content normal.  Lab Results  Component Value Date   WBC 7.9 01/15/2014   HGB 13.5 01/15/2014   HCT 38.0 01/15/2014   MCV 87.8 01/15/2014   PLT 328 01/15/2014   Lab Results  Component Value Date   CREATININE 0.53 09/09/2015   BUN 10 09/09/2015   NA 137 09/09/2015   K 4.7 09/09/2015   CL 102 09/09/2015   CO2 29 09/09/2015    Lab Results  Component Value Date   HGBA1C 7.0 09/09/2015   Lipid Panel     Component Value Date/Time   CHOL 188 09/09/2015 0942   TRIG 115 09/09/2015 0942   HDL 44 (L) 09/09/2015 0942   CHOLHDL 4.3 09/09/2015 0942   VLDL 23 09/09/2015 0942   LDLCALC 121  09/09/2015 0942       Depression screen PHQ 2/9 03/18/2016 04/18/2014  Decreased Interest 2 1  Down, Depressed, Hopeless 0 2  PHQ - 2 Score 2 3  Altered sleeping 0 0  Tired, decreased energy 2 3  Change in appetite 0 0  Feeling bad or failure about yourself  0 0  Trouble concentrating 1 3  Moving slowly or fidgety/restless 0 0  Suicidal thoughts 0 0  PHQ-9 Score 5 9    Assessment and plan:  1. Hypertension, essential Controlled, continue norvasc 10 qday for now. May reduce if continued dizzy spells. - amLODipine (NORVASC) 10 MG tablet; Take 1 tablet (10 mg total) by mouth daily.  Dispense: 90 tablet; Refill: 3  2. Slow transit constipation Per gi, on high fiber diet/  3. Controlled type 2 diabetes mellitus with complication, without long-term current use of insulin (HCC) a1c 6, in prediabetes range, Will decrease metformin to 500 qd. - hypoglycemia causing dizzy spells? - POCT glycosylated hemoglobin (Hb A1C) - CBC with Differential/Platelet - BASIC METABOLIC PANEL WITH GFR - metFORMIN (GLUCOPHAGE) 500 MG tablet; Take 1 tablet (500 mg total) by mouth daily with a meal.  Dispense: 90 tablet; Refill: 3  4. Dizziness,  - sounds vertiginous, but difficult to pinpoint w/ interpreter. - a1c now 6, maybe having dizzy spells due to hypoglycemia? Or low bp transiently? bph? - reduce metformin to 500 qday - trial meclizine 12.5 prn for now - encourage more water for now. - will ask rn to call pt re: metformin recd.  5. Umbilical hernia without obstruction and without gangrene - Ambulatory referral to General Surgery  6. Vitamin D deficiency - Vitamin D, 25-hydroxy  7. abd pain - multifactorial, w/ chronic constipation/gerd, doubt umbilical hernia is cause, but will refer pe pt request for further eval. - pt denies having any sx w/ metformin on gi standpoint.  8. Health maintenance  - per pt, [facility] - per pt, had tdap in  2014. - pneumooccal 23 valent vaccine today.  51. Spanish speaking, making care more difficult due to language barrier.  Return in about 3 months (around 06/18/2016).  The patient was given clear instructions to go to ER or return to medical center if symptoms don't improve, worsen or new problems develop. The patient verbalized understanding. The patient was told to call to get lab results if they haven't heard anything in the next week.    This note has been created with Surveyor, quantity. Any transcriptional errors are unintentional.   Maren Reamer, MD, [place], [place]   03/18/2016, 12:48 PM

## 2016-03-19 LAB — BASIC METABOLIC PANEL WITH GFR
BUN: 12 mg/dL (ref 7–25)
CALCIUM: 9.6 mg/dL (ref 8.6–10.2)
CHLORIDE: 100 mmol/L (ref 98–110)
CO2: 25 mmol/L (ref 20–31)
Creat: 0.61 mg/dL (ref 0.50–1.10)
GFR, Est African American: >89 mL/min (ref >=60)
GFR, Est Non African American: >89 mL/min (ref >=60)
Glucose, Bld: 104 mg/dL — ABNORMAL HIGH (ref 65–99)
Potassium: 4.6 mmol/L (ref 3.5–5.3)
SODIUM: 136 mmol/L (ref 135–146)

## 2016-03-19 LAB — VITAMIN D 25 HYDROXY (VIT D DEFICIENCY, FRACTURES): VIT D 25 HYDROXY: 20 ng/mL — AB (ref 30–100)

## 2016-03-20 ENCOUNTER — Telehealth: Payer: Self-pay

## 2016-03-20 NOTE — Telephone Encounter (Signed)
Called patient to let her know that we had received a referral for her to be seen at our office. However, patient stated that she did not have health insurance therefore she won't be able to schedule an appointment since she doesn't even have $50 for self-pay co-pay. I told patient that if she experienced terrible abdominal pain, nausea, vomiting and fever, to please go to the ED since we have a surgeon at all times at [facility]. I also told her that if she had additional questions, to please give me a call. Patient understood.

## 2016-03-23 ENCOUNTER — Other Ambulatory Visit: Payer: Self-pay | Admitting: Internal Medicine

## 2016-03-23 ENCOUNTER — Telehealth: Payer: Self-pay

## 2016-03-23 MED ORDER — VITAMIN D (ERGOCALCIFEROL) 1.25 MG (50000 UNIT) PO CAPS: 50000.0000 [IU] | ORAL_CAPSULE | ORAL | 0 refills | Status: DC

## 2016-03-23 MED FILL — VIT D2 1.25 MG (50,000 UNIT: 1.25 MG | 84 days supply | Qty: 12 | Fill #0

## 2016-03-23 NOTE — Telephone Encounter (Signed)
[employer] [place] Id# [phone] contacted pt and went over lab results pt is aware of results and doesn't have any questions or concerns

## 2016-03-26 MED FILL — AMLODIPINE BESYLATE 10 MG T: 10 | 30 days supply | Qty: 30 | Fill #0

## 2016-04-20 ENCOUNTER — Other Ambulatory Visit: Payer: Self-pay | Admitting: Internal Medicine

## 2016-04-20 DIAGNOSIS — Z1231 Encounter for screening mammogram for malignant neoplasm of breast: Secondary | ICD-10-CM

## 2016-04-23 ENCOUNTER — Other Ambulatory Visit: Payer: Self-pay

## 2016-04-23 ENCOUNTER — Ambulatory Visit (INDEPENDENT_AMBULATORY_CARE_PROVIDER_SITE_OTHER): Payer: Self-pay | Admitting: Gastroenterology

## 2016-04-23 ENCOUNTER — Encounter: Payer: Self-pay | Admitting: Gastroenterology

## 2016-04-23 DIAGNOSIS — R109 Unspecified abdominal pain: Secondary | ICD-10-CM

## 2016-04-23 DIAGNOSIS — R140 Abdominal distension (gaseous): Secondary | ICD-10-CM

## 2016-04-23 DIAGNOSIS — R1013 Epigastric pain: Secondary | ICD-10-CM

## 2016-04-23 MED ORDER — GLUCOSE 4 G PO CHEW
CHEWABLE_TABLET | ORAL | 0 refills | Status: DC
Start: 2016-04-23 — End: 2017-01-01

## 2016-04-23 NOTE — Progress Notes (Signed)
Subjective:    Patient ID: Connie Russell, female    DOB: 09-03-1970, 45 y.o.   MRN: [id number] Connie Glatterawn T Langeland, MD  INFO OBTAINED VIA INTERPRETER: [name].  HPI STILL HAVING ABDOMINAL PAIN. TAKING PROTONIX, NOPAL, OR ALOE VERA. TRIED LINZESS 145 MCG ABD IT CAUSED DIARRHEA. BMs: EVERY AM. GOOD RESULTS FROM HER MIXTURE. DRINKS A LOT HERBAL TEAs. PAIN: NOT ALL THE TIME. SOMETIMES WHEN SHE EATS A LOT SHE GETS ACHY PAIN BUT NOT TWISTY OR CRAMPING. EATS AND SHE GETS PAIN AND IT HAPPENS EVERY DAY. DOESN'T LAST LONG:~30 MINS. WHEN SHE WALKS IT GETS BETTER. HAS CHEST AND BACK PAIN WITH SOME SOB PERIODICALLY. HAS A LOT OF GAS. ON ASA DAILY.  PT DENIES FEVER, CHILLS, HEMATOCHEZIA, HEMATEMESIS, nausea, vomiting, melena, diarrhea, CHEST PAIN, SHORTNESS OF BREATH, CHANGE IN BOWEL IN HABITS, constipation,  problems swallowing, OR heartburn.  Past Medical History:  Diagnosis Date  . Diabetes (HCC)   . GERD (gastroesophageal reflux disease)   . Hypertension    Past Surgical History:  Procedure Laterality Date  . ESOPHAGOGASTRODUODENOSCOPY N/A 06/24/2015   SLF: 1. Abdominal pain most likely due to constipation, GERD, and Gastritis. 2. MIld NON-erosive gastritis.  Marland Kitchen. FOOT SURGERY Left 2000   Local    Allergies  Allergen Reactions  . Linzess [Linaclotide]     DIARRHEA AT 145 MICROG   Current Outpatient Prescriptions  Medication Sig Dispense Refill  . amLODipine (NORVASC) 10 MG tablet Take 1 tablet (10 mg total) by mouth daily.    Marland Kitchen. aspirin EC 81 MG tablet Take 1 tablet (81 mg total) by mouth daily.    . cyclobenzaprine (FLEXERIL) 10 MG tablet Take 1 tablet (10 mg total) by mouth at bedtime.    . diphenhydrAMINE (BENADRYL) 25 MG tablet Take 1 tablet (25 mg total) by mouth every 6 (six) hours as needed for itching.    Marland Kitchen. glucose blood (TRUE METRIX BLOOD GLUCOSE TEST) test strip CHECK BLOOD SUGAR TID AND QHS    . meclizine (ANTIVERT) 12.5 MG tablet Take 1 tablet (12.5 mg total) by mouth 3  (three) times daily as needed for dizziness.    . metFORMIN (GLUCOPHAGE) 500 MG tablet Take 1 tablet (500 mg total) by mouth daily with breakfast.    . NON FORMULARY Super Gymnema  One tablet daily    . PROTONIX 40 MG tablet Take 1 tablet (40 mg total) by mouth daily.    Marland Kitchen. GLYCOLAX/MIRALAX powder Take 17 g by mouth 2 (two) times daily as needed.    . TRUEPLUS LANCETS 28G MISC CHECK BLOOD SUGAR TID AND QHS    . Aloe Vera 25 MG CAPS Take 1 capsule by mouth daily.    .      .      . NON FORMULARY Vitamin D   One tablet daily    .      Marland Kitchen.       Review of Systems PER HPI OTHERWISE ALL SYSTEMS ARE NEGATIVE.    Objective:   Physical Exam  Constitutional: She is oriented to person, place, and time. She appears well-developed and well-nourished. No distress.  HENT:  Head: Normocephalic and atraumatic.  Mouth/Throat: Oropharynx is clear and moist. No oropharyngeal exudate.  Eyes: Pupils are equal, round, and reactive to light. No scleral icterus.  Neck: Normal range of motion. Neck supple.  Cardiovascular: Normal rate, regular rhythm and normal heart sounds.   Pulmonary/Chest: Effort normal and breath sounds normal. No respiratory distress.  Abdominal: Soft.  Bowel sounds are normal. She exhibits no distension. There is tenderness. There is no rebound and no guarding.  MILD EPIGASTRIC AND RIGHT/LEFT UPPER QUADRANT TENDERNESS  Musculoskeletal: She exhibits no edema.  Lymphadenopathy:    She has no cervical adenopathy.  Neurological: She is alert and oriented to person, place, and time.  NO FOCAL DEFICITS  Psychiatric: She has a normal mood and affect.  Vitals reviewed.     Assessment & Plan:

## 2016-04-23 NOTE — Progress Notes (Signed)
ON RECALL  °

## 2016-04-23 NOTE — Patient Instructions (Signed)
Take your glucose tablets ONE HOUR PRIOR TO YOUR BREATH TEST .IT WILL BE SCHEDULED WITHIN THE NEXT 2 WEEKS.  CONTINUE MIXTURE OF NOPAL AND ALOE VERA.  FOLLOW UP IN 2 MOS.

## 2016-04-23 NOTE — Assessment & Plan Note (Addendum)
SYMPTOMS NOT CONTROLLED AFTER ADDING PPI AND RESOLVING CONSTIPATION. DIFFERENTIAL DIAGNOSIS INCLUDES: SMALL INTESTINE BACTERIAL OVERGROWTH, LESS LIKELY CELIAC ARTERY SYNDROME, OR SMA SYNDROME.  I TRIED TO PERSONALLY REVIEW THE MRI WITH DR. Tyron RussellBOLES.AWAITING RETURN CALL. Take  glucose tablets ONE HOUR PRIOR TO BREATH TEST. IT WILL BE SCHEDULED WITHIN THE NEXT 2 WEEKS. CONTINUE MIXTURE OF NOPAL AND ALOE VERA.  FOLLOW UP IN 2 MOS.

## 2016-04-23 NOTE — Progress Notes (Signed)
CC'ED TO PCP 

## 2016-04-24 MED FILL — AMLODIPINE BESYLATE 10 MG T: 10 | 30 days supply | Qty: 30 | Fill #1

## 2016-04-27 MED FILL — ?PANTOPRAZOLE SOD DR 40MG: 40 MG | 30 days supply | Qty: 30 | Fill #4

## 2016-05-07 ENCOUNTER — Telehealth: Payer: Self-pay | Admitting: General Practice

## 2016-05-07 ENCOUNTER — Encounter (HOSPITAL_COMMUNITY): Admission: RE | Payer: Self-pay | Source: Ambulatory Visit

## 2016-05-07 ENCOUNTER — Ambulatory Visit (HOSPITAL_COMMUNITY): Admission: RE | Admit: 2016-05-07 | Payer: Self-pay | Source: Ambulatory Visit | Admitting: Gastroenterology

## 2016-05-07 SURGERY — BREATH TEST, FOR INTESTINAL BACTERIAL OVERGROWTH

## 2016-05-07 NOTE — Telephone Encounter (Signed)
I received a call from [facility]) stating that the patient no showed for her HBT.  They tried to reach her yesterday to remind her of her appointment, however they were unsuccessful

## 2016-05-08 NOTE — Telephone Encounter (Signed)
REVIEWED-NO ADDITIONAL RECOMMENDATIONS. 

## 2016-05-08 NOTE — Telephone Encounter (Signed)
Noted  

## 2016-05-11 MED FILL — metFORMIN HCL 500 MG TABS: 500 | 30 days supply | Qty: 60 | Fill #2

## 2016-05-21 ENCOUNTER — Encounter: Payer: Self-pay | Admitting: Gastroenterology

## 2016-05-25 MED FILL — ?AMLODIPINE BESYLATE 10 MG: 10 | 30 days supply | Qty: 30 | Fill #2

## 2016-05-29 ENCOUNTER — Ambulatory Visit: Payer: Self-pay | Attending: Internal Medicine

## 2016-06-25 MED FILL — ?AMLODIPINE BESYLATE 10 MG: 10 | 30 days supply | Qty: 30 | Fill #3

## 2016-06-29 ENCOUNTER — Ambulatory Visit: Payer: Self-pay | Attending: Internal Medicine

## 2016-07-07 ENCOUNTER — Telehealth: Payer: Self-pay

## 2016-07-07 NOTE — Telephone Encounter (Signed)
Pt called to reschedule her HBT. I got her rescheduled for 07/09/16. But the machine is broken again and she is aware that she does not need to come Thursday.

## 2016-07-09 NOTE — Telephone Encounter (Signed)
Routing to [place] to contact the patient to come by the office to pick up the test container

## 2016-07-09 NOTE — Telephone Encounter (Signed)
PLEASE CONTACT PT TO HAVE HBT FOR [facility].

## 2016-07-09 NOTE — Telephone Encounter (Signed)
I'm sending Dr. Darrick PennaFields a message in reference to this test.

## 2016-07-10 NOTE — Telephone Encounter (Signed)
Dr. Darrick PennaFields, [facility] ceased operations as of 01/23/2016.

## 2016-07-16 NOTE — Telephone Encounter (Signed)
PLEASE CALL PT. WE ARE TRYING TO OBTAIN A NEW BREATH TEST. WE WILL CONTACT HER TO SCHEDULE WHEN GET THE NEW ONE.

## 2016-07-17 NOTE — Telephone Encounter (Signed)
Called pt and she does not speak [place]. Told her I was mailing a letter .

## 2016-07-21 ENCOUNTER — Other Ambulatory Visit: Payer: Self-pay | Admitting: Obstetrics & Gynecology

## 2016-07-21 DIAGNOSIS — Z1231 Encounter for screening mammogram for malignant neoplasm of breast: Secondary | ICD-10-CM

## 2016-07-21 MED FILL — AMLODIPINE BESYLATE 10 MG T: 10 | 30 days supply | Qty: 30 | Fill #4

## 2016-07-29 ENCOUNTER — Encounter: Payer: Self-pay | Admitting: Gastroenterology

## 2016-08-24 ENCOUNTER — Ambulatory Visit
Admission: RE | Admit: 2016-08-24 | Discharge: 2016-08-24 | Disposition: A | Discharge status: Home / Self Care | Payer: No Typology Code available for payment source | Source: Ambulatory Visit | Attending: Obstetrics & Gynecology | Admitting: Obstetrics & Gynecology

## 2016-08-24 DIAGNOSIS — Z1231 Encounter for screening mammogram for malignant neoplasm of breast: Secondary | ICD-10-CM

## 2016-08-24 MED FILL — AMLODIPINE BESYLATE 10 MG T: 10 | 30 days supply | Qty: 30 | Fill #5

## 2016-09-23 MED FILL — AMLODIPINE BESYLATE 10 MG T: 10 | 30 days supply | Qty: 30 | Fill #6

## 2016-09-23 MED FILL — ?METFORMIN HCL 500MG TABLET: 500 | 30 days supply | Qty: 30 | Fill #0

## 2016-10-21 MED FILL — AMLODIPINE BESYLATE 10 MG T: 10 | 30 days supply | Qty: 30 | Fill #7

## 2016-10-29 MED FILL — CYCLOBENZAPRINE 10 MG TAB: 10 | 10 days supply | Qty: 10 | Fill #0

## 2016-10-29 MED FILL — NAPROXEN 500 MG TABLET: 500 | 15 days supply | Qty: 30 | Fill #0

## 2016-11-20 MED FILL — ?METFORMIN HCL 500MG TABLET: 500 | 30 days supply | Qty: 30 | Fill #1

## 2016-11-20 MED FILL — AMLODIPINE BESYLATE 10 MG T: 10 | 30 days supply | Qty: 30 | Fill #8

## 2016-12-22 MED FILL — AMLODIPINE BESYLATE 10 MG T: 10 | 30 days supply | Qty: 30 | Fill #9

## 2016-12-24 ENCOUNTER — Encounter: Payer: Self-pay | Admitting: Internal Medicine

## 2016-12-25 ENCOUNTER — Encounter: Payer: Self-pay | Admitting: Internal Medicine

## 2016-12-28 ENCOUNTER — Encounter: Payer: Self-pay | Admitting: Internal Medicine

## 2016-12-29 ENCOUNTER — Ambulatory Visit: Payer: Self-pay | Attending: Internal Medicine

## 2017-01-01 ENCOUNTER — Encounter: Payer: Self-pay | Admitting: Internal Medicine

## 2017-01-01 ENCOUNTER — Ambulatory Visit: Payer: Self-pay | Attending: Internal Medicine | Admitting: Internal Medicine

## 2017-01-01 VITALS — BP 114/78 | HR 76 | Temp 98.0°F | Resp 16 | Ht 63.0 in | Wt 143.4 lb

## 2017-01-01 DIAGNOSIS — I10 Essential (primary) hypertension: Secondary | ICD-10-CM

## 2017-01-01 DIAGNOSIS — K219 Gastro-esophageal reflux disease without esophagitis: Secondary | ICD-10-CM

## 2017-01-01 DIAGNOSIS — E119 Type 2 diabetes mellitus without complications: Secondary | ICD-10-CM | POA: Insufficient documentation

## 2017-01-01 DIAGNOSIS — Z794 Long term (current) use of insulin: Secondary | ICD-10-CM

## 2017-01-01 DIAGNOSIS — E559 Vitamin D deficiency, unspecified: Secondary | ICD-10-CM

## 2017-01-01 LAB — GLUCOSE, POCT (MANUAL RESULT ENTRY): POC GLUCOSE: 132 mg/dL — AB (ref 70–99)

## 2017-01-01 LAB — POCT GLYCOSYLATED HEMOGLOBIN (HGB A1C): HEMOGLOBIN A1C: 6.6

## 2017-01-01 MED ORDER — VITAMIN D (CHOLECALCIFEROL) 10 MCG (400 UNIT) PO CAPS: 400.0000 [IU] | ORAL_CAPSULE | Freq: Every morning | ORAL | 0 refills | Status: AC

## 2017-01-01 MED ORDER — METFORMIN HCL 500 MG PO TABS: 500.0000 mg | ORAL_TABLET | Freq: Every day | ORAL | 3 refills | Status: DC

## 2017-01-01 MED ORDER — AMLODIPINE BESYLATE 10 MG PO TABS: 10.0000 mg | ORAL_TABLET | Freq: Every day | ORAL | 3 refills | Status: DC

## 2017-01-01 NOTE — Progress Notes (Signed)
Patient ID: Connie Russell, female    DOB: September 12, 1970  MRN: [id number]  CC: Establish Care and Medication Refill   Subjective: Connie Russell is a 46 y.o. female who presents for chronic ds management Her concerns today include:  DIABETES TYPE 2 Last A1C:   Results for orders placed or performed in visit on 01/01/17  POCT glucose (manual entry)  Result Value Ref Range   POC Glucose 132 (A) 70 - 99 mg/dl  POCT glycosylated hemoglobin (Hb A1C)  Result Value Ref Range   Hemoglobin A1C 6.6     Med Adherence:  [x]  Yes    []  No Medication side effects:  []  Yes    [x]  No Home Monitoring?  [x]  Yes but not often    []  No Home glucose results range: Diet Adherence: [x]  Yes.  Doing well.  Avoids sweet drinks and limits portion sizes   []  No Exercise: [x]  Yes.  Walk on treadmill 3 x a wk    []  No Hypoglycemic episodes?: []  Yes    [x]  No Numbness of the feet? []  Yes    [x]  No.  But some pain at times in big toe Retinopathy hx? []  Yes    [x]  No Last eye exam: due.  Endorses blurred vision Comments:   2.  HTN -compliant with Norvasc -no LE edema/CP/SOB  3.  Constipation/GERD -stopped Pantoprazole and Miralax -drinking catcus and papaya which keeps symptoms control and "eating seeds" to keep bowel movement regular  4.  Dizziness - better.   5. Vit D def: out of replacement  Patient Active Problem List   Diagnosis Date Noted  . Diabetes mellitus type 2, controlled ([place]) 03/18/2016  . Gastritis 09/16/2015  . Abdominal pain 09/16/2015  . Dyspepsia 06/13/2015  . Constipation 01/15/2014  . GERD (gastroesophageal reflux disease) 01/15/2014  . Hypertension, essential 08/14/2013  . Hyperglycemia 08/14/2013  . Other and unspecified hyperlipidemia 08/14/2013  . Preventative health care 07/14/2013     Current Outpatient Prescriptions on File Prior to Visit  Medication Sig Dispense Refill  . Aloe Vera 25 MG CAPS Take 1 capsule by mouth daily.    Marland Kitchen aspirin EC 81 MG  tablet Take 1 tablet (81 mg total) by mouth daily. 30 tablet 11  . Blood Glucose Monitoring Suppl (TRUE METRIX METER) w/Device KIT CHECK BLOOD SUGAR TID AND QHS 1 kit 0  . glucose blood (TRUE METRIX BLOOD GLUCOSE TEST) test strip CHECK BLOOD SUGAR TID AND QHS 100 each 12  . TRUEPLUS LANCETS 28G MISC CHECK BLOOD SUGAR TID AND QHS 100 each 5  . hydrocortisone 2.5 % cream Apply topically 2 (two) times daily. Apply to rash areas, do not apply to face (Patient not taking: Reported on 04/23/2016) 30 g 0  . NON FORMULARY Vitamin D   One tablet daily    . NON FORMULARY Super Gymnema  One tablet daily    . Vitamin D, Ergocalciferol, (DRISDOL) 50000 units CAPS capsule Take 1 capsule (50,000 Units total) by mouth every 7 (seven) days. (Patient not taking: Reported on 04/23/2016) 12 capsule 0   No current facility-administered medications on file prior to visit.     Allergies  Allergen Reactions  . Linzess [Linaclotide]     DIARRHEA AT 145 MICROG    Social History   Social History  . Marital status: Married    Spouse name: N/A  . Number of children: N/A  . Years of education: N/A   Occupational History  . Not on  file.   Social History Main Topics  . Smoking status: Never Smoker  . Smokeless tobacco: Never Used     Comment: Never smoked  . Alcohol use No  . Drug use: No  . Sexual activity: Yes    Birth control/ protection: IUD   Other Topics Concern  . 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    Family History  Problem Relation Age of Onset  . Hypertension Mother   . Diabetes Maternal Grandmother   . Colon cancer Neg Hx     Past Surgical History:  Procedure Laterality Date  . ESOPHAGOGASTRODUODENOSCOPY N/A 06/24/2015   SLF: 1. Abdominal pain most likely due to constipation, GERD, and Gastritis. 2. MIld NON-erosive gastritis.  Marland Kitchen FOOT SURGERY Left 2000   Local    ROS: Review of Systems  Constitutional:  Positive for activity change. Negative for appetite change.  Respiratory: Negative for chest tightness and shortness of breath.   Cardiovascular: Negative for chest pain, palpitations and leg swelling.  Neurological: Positive for dizziness (use to occur at nights.  Better since last visit).    PHYSICAL EXAM: BP 114/78 (BP Location: Right Arm, Patient Position: Sitting, Cuff Size: Large)   Pulse 76   Temp 98 F (36.7 C) (Oral)   Resp 16   Ht 5' 3"  (1.6 m)   Wt 143 lb 6.4 oz (65 kg)   SpO2 99%   BMI 25.40 kg/m   Wt Readings from Last 3 Encounters:  01/01/17 143 lb 6.4 oz (65 kg)  04/23/16 139 lb 12.8 oz (63.4 kg)  03/18/16 139 lb 12.8 oz (63.4 kg)    Physical Exam General appearance - alert, well appearing, and in no distress Mental status - alert, oriented to person, place, and time, normal mood, behavior, speech, dress, motor activity, and thought processes Eyes - pupils equal and reactive, extraocular eye movements intact Neck - supple, no significant adenopathy Chest - clear to auscultation, no wheezes, rales or rhonchi, symmetric air entry Heart - normal rate, regular rhythm, normal S1, S2, no murmurs, rubs, clicks or gallops Extremities - peripheral pulses normal, no pedal edema, no clubbing or cyanosis  ASSESSMENT AND PLAN: 1. Controlled type 2 diabetes mellitus without complication, without long-term current use of insulin ([place]) -Commended her on good job. -Encouraged to continue healthy eating and regular exercise -Continue metformin - POCT glucose (manual entry) - POCT glycosylated hemoglobin (Hb A1C) - Ambulatory referral to Ophthalmology - Microalbumin / creatinine urine ratio  2. Essential hypertension, benign -At goal. In future may change Norvasc to lisinopril -DASH diet discussed - amLODipine (NORVASC) 10 MG tablet; Take 1 tablet (10 mg total) by mouth daily.  Dispense: 90 tablet; Refill: 3  3. Vitamin D deficiency -No real benefit of supplementation in  this young patient who is asymptomatic.  However patient requested refill. Will give supplement. - Vitamin D, Cholecalciferol, 400 units CAPS; Take 400 Units by mouth every morning.  Dispense: 90 capsule; Refill: 0  4. Gastroesophageal reflux disease without esophagitis -Doing well off medication    Patient was given the opportunity to ask questions.  Patient verbalized understanding of the plan and was able to repeat key elements of the plan.  [employer] interpreter used during this encounter.   Orders Placed This Encounter  Procedures  . Microalbumin / creatinine urine ratio  . Ambulatory referral to Ophthalmology  . POCT glucose (manual entry)  .  POCT glycosylated hemoglobin (Hb A1C)     Requested Prescriptions   Signed Prescriptions Disp Refills  . amLODipine (NORVASC) 10 MG tablet 90 tablet 3    Sig: Take 1 tablet (10 mg total) by mouth daily.  . metFORMIN (GLUCOPHAGE) 500 MG tablet 90 tablet 3    Sig: Take 1 tablet (500 mg total) by mouth daily with breakfast.  . Vitamin D, Cholecalciferol, 400 units CAPS 90 capsule 0    Sig: Take 400 Units by mouth every morning.    Return in about 4 months (around 05/04/2017).  Karle Plumber, MD, FACP

## 2017-01-01 NOTE — Patient Instructions (Signed)
Plan de alimentacin DASH (DASH Eating Plan) DASH es la sigla en ingls de "Enfoques Alimentarios para Detener la Hipertensin". El plan de alimentacin DASH ha demostrado bajar la presin arterial elevada (hipertensin). Los beneficios adicionales para la salud pueden incluir la disminucin del riesgo de diabetes mellitus tipo2, enfermedades cardacas e ictus. Este plan tambin puede ayudar a adelgazar. QU DEBO SABER ACERCA DEL PLAN DE ALIMENTACIN DASH? Para el plan de alimentacin DASH, seguir las siguientes pautas generales:  Elija los alimentos que contienen menos de 150 miligramos de sodio por porcin (segn se indica en la etiqueta de los alimentos).  Use hierbas o aderezos sin sal, en lugar de sal de mesa o sal marina.  Consulte al mdico o farmacutico antes de usar sustitutos de la sal.  Consuma los productos con menor contenido de sodio. Estos productos suelen estar etiquetados como "bajo en sodio" o "sin agregado de sal".  Coma alimentos frescos. No consuma una gran cantidad de alimentos enlatados.  Coma ms verduras, frutas y productos lcteos con bajo contenido de grasas.  Elija los cereales integrales. Busque la palabra "integral" en el primer lugar de la lista de ingredientes.  Elija el pescado y el pollo o el pavo sin piel ms a menudo que las carnes rojas. Limite el consumo de pescado, carne de ave y carne a 6onzas (170g) por da.  Limite el consumo de dulces, postres, azcares y bebidas azucaradas.  Elija las grasas saludables para el corazn.  Consuma ms comida casera y menos de restaurante, de buf y comida rpida.  Limite el consumo de alimentos fritos.  No fra los alimentos. A la hora de cocinarlos, opte por hornearlos, hervirlos, grillarlos y asarlos a la parrilla.  Cuando coma en un restaurante, pida que preparen su comida con menos sal o, en lo posible, sin nada de sal. QU ALIMENTOS PUEDO COMER? Pida ayuda a un nutricionista para conocer las  necesidades calricas individuales. Cereales Pan de salvado o integral. Arroz integral. Pastas de salvado o integrales. Quinua, trigo burgol y cereales integrales. Cereales con bajo contenido de sodio. Tortillas de harina de maz o de salvado. Pan de maz integral. Galletas saladas integrales. Galletas con bajo contenido de sodio. Vegetales Verduras frescas o congeladas (crudas, al vapor, asadas o grilladas). Jugos de tomate y verduras con contenido bajo o reducido de sodio. Pasta y salsa de tomate con contenido bajo o reducido de sodio. Verduras enlatadas con bajo contenido de sodio o reducido de sodio. Frutas Frutas frescas, en conserva (en su jugo natural) o frutas congeladas. Carnes y otros productos con protenas Carne de res molida (al 85% o ms magra), carne de res de animales alimentados con pastos o carne de res sin la grasa. Pollo o pavo sin piel. Carne de pollo o de pavo molida. Cerdo sin la grasa. Todos los pescados y frutos de mar. Huevos. Porotos, guisantes o lentejas secos. Frutos secos y semillas sin sal. Frijoles enlatados sin sal. Lcteos Productos lcteos con bajo contenido de grasas, como leche descremada o al 1%, quesos reducidos en grasas o al 2%, ricota con bajo contenido de grasas o queso cottage, o yogur natural con bajo contenido de grasas. Quesos con contenido bajo o reducido de sodio. Grasas y aceites Margarinas en barra que no contengan grasas trans. Mayonesa y alios para ensaladas livianos o reducidos en grasas (reducidos en sodio). [place]. Aceites de crtamo, oliva o canola. Mantequilla natural de man o almendra. Otros Palomitas de maz y pretzels sin sal. Los artculos mencionados arriba pueden no   ser una lista completa de las bebidas o los alimentos recomendados. Comunquese con el nutricionista para conocer ms opciones. QU ALIMENTOS NO SE RECOMIENDAN? Cereales Pan blanco. Pastas blancas. Arroz blanco. Pan de maz refinado. Bagels y croissants. Galletas  saladas que contengan grasas trans. Vegetales Vegetales con crema o fritos. Verduras en salsa de queso. Verduras enlatadas comunes. Pasta y salsa de tomate en lata comunes. Jugos comunes de tomate y de verduras. Frutas Fruta enlatada en almbar liviano o espeso. Jugo de frutas. Carnes y otros productos con protenas Cortes de carne con grasa. Costillas, alas de pollo, tocineta, salchicha, mortadela, salame, chinchulines, tocino, perros calientes, salchichas alemanas y embutidos envasados. Frutos secos y semillas con sal. Frijoles con sal en lata. Lcteos Leche entera o al 2%, crema, mezcla de leche y crema, y queso crema. Yogur entero o endulzado. Quesos o queso azul con alto contenido de grasas. Cremas no lcteas y coberturas batidas. Quesos procesados, quesos para untar o cuajadas. Condimentos Sal de cebolla y ajo, sal condimentada, sal de mesa y sal marina. Salsas en lata y envasadas. Salsa Worcestershire. Salsa trtara. Salsa barbacoa. Salsa teriyaki. Salsa de soja, incluso la que tiene contenido reducido de sodio. Salsa de carne. Salsa de pescado. Salsa de ostras. Salsa rosada. Rbano picante. Ketchup y mostaza. Saborizantes y tiernizantes para carne. Caldo en cubitos. Salsa picante. Salsa tabasco. Adobos. Aderezos para tacos. Salsas. Grasas y aceites Mantequilla, margarina en barra, manteca de cerdo, grasa, mantequilla clarificada y grasa de tocino. Aceites de coco, de palmiste o de palma. Aderezos comunes para ensalada. Otros Pickles y aceitunas. Palomitas de maz y pretzels con sal. Los artculos mencionados arriba pueden no ser una lista completa de las bebidas y los alimentos que se deben evitar. Comunquese con el nutricionista para obtener ms informacin. DNDE PUEDO ENCONTRAR MS INFORMACIN? Instit[employer] Nacional del Corazn, del Pulmn y de la Sangre (National Heart, Lung, and Blood Institute): www.nhlbi.nih.gov/health/health-topics/topics/dash/ Esta informacin no tiene como fin  reemplazar el consejo del mdico. Asegrese de hacerle al mdico cualquier pregunta que tenga. Document Released: 07/16/2011 Document Revised: 11/18/2015 Document Reviewed: 05/31/2013 Elsevier Interactive Patient Education  2017 Elsevier Inc.  

## 2017-01-02 LAB — MICROALBUMIN / CREATININE URINE RATIO
Creatinine, Urine: 14 mg/dL
Microalb/Creat Ratio: <21.4 mg/g creat (ref 0.0–30.0)

## 2017-01-07 MED FILL — metFORMIN HCL 500 MG TABS: 500 | 30 days supply | Qty: 30 | Fill #2

## 2017-01-20 MED FILL — AMLODIPINE BESYLATE 10 MG T: 10 | 30 days supply | Qty: 30 | Fill #10

## 2017-02-23 MED FILL — ?METFORMIN HCL 500MG TABLET: 500 | 30 days supply | Qty: 30 | Fill #3

## 2017-02-23 MED FILL — ?AMLODIPINE BESYLATE 10 MG: 10 | 30 days supply | Qty: 30 | Fill #11

## 2017-03-02 ENCOUNTER — Emergency Department (HOSPITAL_COMMUNITY)
Admission: EM | Admit: 2017-03-02 | Discharge: 2017-03-02 | Disposition: A | Discharge status: Home / Self Care | Payer: No Typology Code available for payment source | Attending: Emergency Medicine | Admitting: Emergency Medicine

## 2017-03-02 ENCOUNTER — Emergency Department (HOSPITAL_COMMUNITY): Payer: No Typology Code available for payment source

## 2017-03-02 ENCOUNTER — Encounter (HOSPITAL_COMMUNITY): Payer: Self-pay | Admitting: Adult Health

## 2017-03-02 DIAGNOSIS — Z79899 Other long term (current) drug therapy: Secondary | ICD-10-CM | POA: Insufficient documentation

## 2017-03-02 DIAGNOSIS — M542 Cervicalgia: Secondary | ICD-10-CM | POA: Diagnosis not present

## 2017-03-02 DIAGNOSIS — R0789 Other chest pain: Secondary | ICD-10-CM

## 2017-03-02 DIAGNOSIS — Y998 Other external cause status: Secondary | ICD-10-CM | POA: Diagnosis not present

## 2017-03-02 DIAGNOSIS — Y92410 Unspecified street and highway as the place of occurrence of the external cause: Secondary | ICD-10-CM | POA: Insufficient documentation

## 2017-03-02 DIAGNOSIS — Y939 Activity, unspecified: Secondary | ICD-10-CM | POA: Diagnosis not present

## 2017-03-02 DIAGNOSIS — R51 Headache: Secondary | ICD-10-CM | POA: Diagnosis not present

## 2017-03-02 DIAGNOSIS — E119 Type 2 diabetes mellitus without complications: Secondary | ICD-10-CM | POA: Diagnosis not present

## 2017-03-02 DIAGNOSIS — Z7982 Long term (current) use of aspirin: Secondary | ICD-10-CM | POA: Diagnosis not present

## 2017-03-02 DIAGNOSIS — I10 Essential (primary) hypertension: Secondary | ICD-10-CM | POA: Diagnosis not present

## 2017-03-02 LAB — I-STAT CHEM 8, ED
BUN: 15 mg/dL (ref 6–20)
CALCIUM ION: 1.22 mmol/L (ref 1.15–1.40)
Chloride: 103 mmol/L (ref 101–111)
Creatinine, Ser: 0.5 mg/dL (ref 0.44–1.00)
GLUCOSE: 228 mg/dL — AB (ref 65–99)
HCT: 43 % (ref 36.0–46.0)
Hemoglobin: 14.6 g/dL (ref 12.0–15.0)
POTASSIUM: 3.7 mmol/L (ref 3.5–5.1)
Sodium: 139 mmol/L (ref 135–145)
TCO2: 23 mmol/L (ref 0–100)

## 2017-03-02 LAB — POC URINE PREG, ED: PREG TEST UR: NEGATIVE

## 2017-03-02 MED ORDER — IBUPROFEN 600 MG PO TABS: 600.0000 mg | ORAL_TABLET | Freq: Three times a day (TID) | ORAL | 0 refills | Status: DC | PRN

## 2017-03-02 MED ORDER — HYDROCODONE-ACETAMINOPHEN 5-325 MG PO TABS: 1.0000 | ORAL_TABLET | ORAL | 0 refills | Status: DC | PRN

## 2017-03-02 MED ORDER — MORPHINE SULFATE (PF) 4 MG/ML IV SOLN
6.0000 mg | Freq: Once | INTRAVENOUS | Status: AC
  Administered 2017-03-02: 6 mg via INTRAVENOUS
  Filled 2017-03-02: qty 2

## 2017-03-02 MED ORDER — ONDANSETRON HCL 4 MG/2ML IJ SOLN
4.0000 mg | Freq: Four times a day (QID) | INTRAMUSCULAR | Status: DC | PRN
  Administered 2017-03-02: 4 mg via INTRAVENOUS
  Filled 2017-03-02: qty 2
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
## 2017-03-02 NOTE — ED Notes (Signed)
Pt and family left before discharge instructions and Rx's were given.  Will attempt to call pt to return for Rx's
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
## 2017-03-04 ENCOUNTER — Emergency Department (HOSPITAL_COMMUNITY)
Admission: EM | Admit: 2017-03-04 | Discharge: 2017-03-04 | Disposition: A | Discharge status: Home / Self Care | Payer: No Typology Code available for payment source | Attending: Emergency Medicine | Admitting: Emergency Medicine

## 2017-03-04 ENCOUNTER — Emergency Department (HOSPITAL_COMMUNITY): Payer: No Typology Code available for payment source

## 2017-03-04 DIAGNOSIS — R1012 Left upper quadrant pain: Secondary | ICD-10-CM | POA: Diagnosis not present

## 2017-03-04 DIAGNOSIS — R0789 Other chest pain: Secondary | ICD-10-CM | POA: Diagnosis not present

## 2017-03-04 DIAGNOSIS — Z7984 Long term (current) use of oral hypoglycemic drugs: Secondary | ICD-10-CM | POA: Insufficient documentation

## 2017-03-04 DIAGNOSIS — Z79899 Other long term (current) drug therapy: Secondary | ICD-10-CM | POA: Diagnosis not present

## 2017-03-04 DIAGNOSIS — E119 Type 2 diabetes mellitus without complications: Secondary | ICD-10-CM | POA: Insufficient documentation

## 2017-03-04 DIAGNOSIS — I10 Essential (primary) hypertension: Secondary | ICD-10-CM | POA: Diagnosis not present

## 2017-03-04 DIAGNOSIS — F0781 Postconcussional syndrome: Secondary | ICD-10-CM | POA: Insufficient documentation

## 2017-03-04 LAB — CBC WITH DIFFERENTIAL/PLATELET
Basophils Absolute: 0 10*3/uL (ref 0.0–0.1)
Basophils Relative: 1 %
Eosinophils Absolute: 0 10*3/uL (ref 0.0–0.7)
Eosinophils Relative: 1 %
HCT: 38.5 % (ref 36.0–46.0)
Hemoglobin: 13.6 g/dL (ref 12.0–15.0)
Lymphocytes Relative: 36 %
Lymphs Abs: 2.1 10*3/uL (ref 0.7–4.0)
MCH: 30.9 pg (ref 26.0–34.0)
MCHC: 35.3 g/dL (ref 30.0–36.0)
MCV: 87.5 fL (ref 78.0–100.0)
Monocytes Absolute: 0.2 10*3/uL (ref 0.1–1.0)
Monocytes Relative: 3 %
Neutro Abs: 3.5 10*3/uL (ref 1.7–7.7)
Neutrophils Relative %: 59 %
Platelets: 307 10*3/uL (ref 150–400)
RBC: 4.4 MIL/uL (ref 3.87–5.11)
RDW: 12.2 % (ref 11.5–15.5)
WBC: 5.8 10*3/uL (ref 4.0–10.5)

## 2017-03-04 LAB — I-STAT BETA HCG BLOOD, ED (MC, WL, AP ONLY): I-stat hCG, quantitative: <5 m[IU]/mL (ref <5)

## 2017-03-04 LAB — COMPREHENSIVE METABOLIC PANEL
ALBUMIN: 4.3 g/dL (ref 3.5–5.0)
ALK PHOS: 62 U/L (ref 38–126)
ALT: 17 U/L (ref 14–54)
AST: 19 U/L (ref 15–41)
Anion gap: 9 (ref 5–15)
BILIRUBIN TOTAL: 0.5 mg/dL (ref 0.3–1.2)
BUN: 11 mg/dL (ref 6–20)
CALCIUM: 8.7 mg/dL — AB (ref 8.9–10.3)
CO2: 22 mmol/L (ref 22–32)
Chloride: 108 mmol/L (ref 101–111)
Creatinine, Ser: 0.52 mg/dL (ref 0.44–1.00)
GFR calc Af Amer: >60 mL/min (ref >60)
GFR calc non Af Amer: >60 mL/min (ref >60)
GLUCOSE: 120 mg/dL — AB (ref 65–99)
POTASSIUM: 3.7 mmol/L (ref 3.5–5.1)
Sodium: 139 mmol/L (ref 135–145)
TOTAL PROTEIN: 7.3 g/dL (ref 6.5–8.1)

## 2017-03-04 MED ORDER — IOPAMIDOL (ISOVUE-300) INJECTION 61%
INTRAVENOUS | Status: AC
  Administered 2017-03-04: 100 mL
  Filled 2017-03-04: qty 100

## 2017-03-04 MED ORDER — DIPHENHYDRAMINE HCL 50 MG/ML IJ SOLN
25.0000 mg | Freq: Once | INTRAMUSCULAR | Status: AC
  Administered 2017-03-04: 25 mg via INTRAVENOUS
  Filled 2017-03-04: qty 1

## 2017-03-04 MED ORDER — CYCLOBENZAPRINE HCL 10 MG PO TABS: 10.0000 mg | ORAL_TABLET | Freq: Two times a day (BID) | ORAL | 0 refills | Status: DC | PRN

## 2017-03-04 MED ORDER — SODIUM CHLORIDE 0.9 % IV BOLUS (SEPSIS)
1000.0000 mL | Freq: Once | INTRAVENOUS | Status: AC
  Administered 2017-03-04: 1000 mL via INTRAVENOUS

## 2017-03-04 MED ORDER — PROCHLORPERAZINE EDISYLATE 5 MG/ML IJ SOLN
5.0000 mg | Freq: Once | INTRAMUSCULAR | Status: AC
  Administered 2017-03-04: 5 mg via INTRAVENOUS
  Filled 2017-03-04: qty 2

## 2017-03-04 NOTE — ED Notes (Signed)
Patient Alert and oriented X4. Stable and ambulatory. Patient verbalized understanding of the discharge instructions.  Patient belongings were taken by the patient.  
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
## 2017-03-05 MED FILL — ?CYCLOBENZAPRINE 10 MG TABL: 10 | 10 days supply | Qty: 20 | Fill #0

## 2017-03-23 ENCOUNTER — Telehealth: Payer: Self-pay | Admitting: Internal Medicine

## 2017-03-23 DIAGNOSIS — I10 Essential (primary) hypertension: Secondary | ICD-10-CM

## 2017-03-23 MED ORDER — AMLODIPINE BESYLATE 10 MG PO TABS
10.0000 mg | ORAL_TABLET | Freq: Every day | ORAL | 0 refills | Status: DC
Start: 2017-03-23 — End: 2017-06-04

## 2017-03-23 MED FILL — AMLODIPINE BESYLATE 10 MG T: 10 | 30 days supply | Qty: 30 | Fill #0

## 2017-03-23 NOTE — Telephone Encounter (Signed)
Pt. Came to facility requesting a refill on amLODipine (NORVASC) 10 MG tablet Pt. Uses [facility] pharmacy. Please f/u

## 2017-04-21 MED FILL — AMLODIPINE BESYLATE 10 MG T: 10 | 30 days supply | Qty: 30 | Fill #1

## 2017-05-20 MED FILL — ?METFORMIN HCL 500MG TABLET: 500 | 30 days supply | Qty: 30 | Fill #0

## 2017-05-20 MED FILL — AMLODIPINE BESYLATE 10 MG T: 10 | 30 days supply | Qty: 30 | Fill #2

## 2017-06-04 ENCOUNTER — Encounter: Payer: Self-pay | Admitting: Internal Medicine

## 2017-06-04 ENCOUNTER — Ambulatory Visit: Payer: Self-pay | Attending: Internal Medicine | Admitting: Internal Medicine

## 2017-06-04 VITALS — BP 139/87 | HR 95 | Temp 98.0°F | Resp 16 | Wt 142.8 lb

## 2017-06-04 DIAGNOSIS — K2970 Gastritis, unspecified, without bleeding: Secondary | ICD-10-CM | POA: Insufficient documentation

## 2017-06-04 DIAGNOSIS — Z9889 Other specified postprocedural states: Secondary | ICD-10-CM | POA: Insufficient documentation

## 2017-06-04 DIAGNOSIS — E559 Vitamin D deficiency, unspecified: Secondary | ICD-10-CM | POA: Insufficient documentation

## 2017-06-04 DIAGNOSIS — K5900 Constipation, unspecified: Secondary | ICD-10-CM | POA: Insufficient documentation

## 2017-06-04 DIAGNOSIS — E785 Hyperlipidemia, unspecified: Secondary | ICD-10-CM | POA: Insufficient documentation

## 2017-06-04 DIAGNOSIS — Z23 Encounter for immunization: Secondary | ICD-10-CM

## 2017-06-04 DIAGNOSIS — E119 Type 2 diabetes mellitus without complications: Secondary | ICD-10-CM

## 2017-06-04 DIAGNOSIS — Z79899 Other long term (current) drug therapy: Secondary | ICD-10-CM | POA: Insufficient documentation

## 2017-06-04 DIAGNOSIS — Z8249 Family history of ischemic heart disease and other diseases of the circulatory system: Secondary | ICD-10-CM | POA: Insufficient documentation

## 2017-06-04 DIAGNOSIS — Z888 Allergy status to other drugs, medicaments and biological substances status: Secondary | ICD-10-CM | POA: Insufficient documentation

## 2017-06-04 DIAGNOSIS — K219 Gastro-esophageal reflux disease without esophagitis: Secondary | ICD-10-CM | POA: Insufficient documentation

## 2017-06-04 DIAGNOSIS — Z7982 Long term (current) use of aspirin: Secondary | ICD-10-CM | POA: Insufficient documentation

## 2017-06-04 DIAGNOSIS — Z833 Family history of diabetes mellitus: Secondary | ICD-10-CM | POA: Insufficient documentation

## 2017-06-04 DIAGNOSIS — I10 Essential (primary) hypertension: Secondary | ICD-10-CM

## 2017-06-04 DIAGNOSIS — E1165 Type 2 diabetes mellitus with hyperglycemia: Secondary | ICD-10-CM | POA: Insufficient documentation

## 2017-06-04 DIAGNOSIS — H8110 Benign paroxysmal vertigo, unspecified ear: Secondary | ICD-10-CM

## 2017-06-04 LAB — POCT GLYCOSYLATED HEMOGLOBIN (HGB A1C): HEMOGLOBIN A1C: 6.8

## 2017-06-04 LAB — GLUCOSE, POCT (MANUAL RESULT ENTRY): POC GLUCOSE: 146 mg/dL — AB (ref 70–99)

## 2017-06-04 MED ORDER — AMLODIPINE BESYLATE 10 MG PO TABS: 10.0000 mg | ORAL_TABLET | Freq: Every day | ORAL | 6 refills | Status: DC

## 2017-06-04 MED ORDER — METFORMIN HCL 500 MG PO TABS: 500.0000 mg | ORAL_TABLET | Freq: Every day | ORAL | 3 refills | Status: DC

## 2017-06-04 NOTE — Patient Instructions (Signed)
Vrtigo posicional benigno (Benign Positional Vertigo) El vrtigo es la sensacin de que usted o todo lo que lo rodea se mueven cuando en realidad eso no sucede. El vrtigo posicional benigno es el tipo de vrtigo ms comn. La causa de este trastorno no es grave (es benigna). Algunos movimientos y determinadas posiciones pueden desencadenar el trastorno (es posicional). El vrtigo posicional benigno puede ser peligroso si ocurre mientras est haciendo algo que podra suponer un riesgo para usted y para los dems, por ejemplo, conduciendo un automvil. CAUSAS En muchos de los casos, se desconoce la causa de este trastorno. Puede deberse a una alteracin en una zona del odo interno que ayuda al cerebro a percibir el movimiento y a coordinar el equilibrio. Esta alteracin puede deberse a una infeccin viral (laberintitis), a una lesin en la cabeza o a los movimientos reiterados. FACTORES DE RIESGO Es ms probable que esta afeccin se manifieste en:  Las mujeres.  Las personas mayores de 50aos. SNTOMAS Generalmente, los sntomas de este trastorno se presentan al mover la cabeza o los ojos en diferentes direcciones. Pueden aparecer repentinamente y suelen durar menos de un minuto. Entre los sntomas se pueden incluir los siguientes:  Prdida del equilibrio y cadas.  Sensacin de estar dando vueltas o movindose.  Sensacin de que el entorno est dando vueltas o movindose.  Nuseas y vmitos.  Visin borrosa.  Mareos.  Movimientos oculares involuntarios (nistagmo). Los sntomas pueden ser leves y algo fastidiosos, o pueden ser graves e interferir en la vida cotidiana. Los episodios de vrtigo posicional benigno pueden repetirse (ser recurrentes) a lo largo del tiempo, y algunos movimientos pueden desencadenarlos. Los sntomas pueden mejorar con el tiempo. DIAGNSTICO Generalmente, este trastorno se diagnostica con una historia clnica y un examen fsico de la cabeza, el cuello y los  odos. Tal vez lo deriven a un mdico especialista en problemas de la garganta, la nariz y el odo (otorrinolaringlogo), o a uno que se especializa en trastornos del sistema nervioso (neurlogo). Pueden hacerle otros estudios, entre ellos:  Resonancia magntica.  Tomografa computarizada.  Estudios de los movimientos oculares. El mdico puede pedirle que cambie rpidamente de posicin mientras observa si se presentan sntomas de vrtigo posicional benigno, por ejemplo, nistagmo. Los movimientos oculares se pueden estudiar con una electronistagmografa (ENG), con estimulacin trmica, mediante la maniobra de Dix-Hallpike o con la prueba de rotacin.  Electroencefalograma (EEG). Este estudio registra la actividad elctrica del cerebro.  Pruebas de audicin. TRATAMIENTO Generalmente, para tratar este trastorno, el mdico le har movimientos especficos con la cabeza para que el odo interno se normalice. Cuando los casos son graves, tal vez haya que realizar una ciruga, pero esto no es frecuente. En algunos casos, el vrtigo posicional benigno se resuelve por s solo en el trmino de 2 o 4semanas. INSTRUCCIONES PARA EL CUIDADO EN EL HOGAR Seguridad  Muvase lentamente.No haga movimientos bruscos con el cuerpo o con la cabeza.  No conduzca.  No opere maquinaria pesada.  No haga ninguna tarea que podra ser peligrosa para usted o para otras personas en caso de que ocurriera un episodio de vrtigo.  Si tiene dificultad para caminar o mantener el equilibrio, use un bastn para mantener la estabilidad. Si se siente mareado o inestable, sintese de inmediato.  Reanude sus actividades normales como se lo haya indicado el mdico. Pregntele al mdico qu actividades son seguras para usted. Instrucciones generales  Tome los medicamentos de venta libre y los recetados solamente como se lo haya indicado el mdico.    Evite algunas posiciones o determinados movimientos como se lo haya indicado el  mdico.  Beba suficiente lquido para mantener la orina clara o de color amarillo plido.  Concurra a todas las visitas de control como se lo haya indicado el mdico. Esto es importante. SOLICITE ATENCIN MDICA SI:  Tiene fiebre.  El trastorno empeora, o le aparecen sntomas nuevos.  Sus familiares o amigos advierten cambios en su comportamiento.  Las nuseas o los vmitos empeoran.  Tiene sensacin de hormigueo o de adormecimiento. SOLICITE ATENCIN MDICA DE INMEDIATO SI:  Tiene dificultad para hablar o para moverse.  Esta mareado todo el tiempo.  Se desmaya.  Tiene dolores de cabeza intensos.  Tiene debilidad en los brazos o las piernas.  Tiene cambios en la audicin o la visin.  Siente rigidez en el cuello.  Tiene sensibilidad a la luz. Esta informacin no tiene como fin reemplazar el consejo del mdico. Asegrese de hacerle al mdico cualquier pregunta que tenga. Document Released: 11/12/2008 Document Revised: 04/17/2015 Document Reviewed: 11/19/2014 Elsevier Interactive Patient Education  2018 [employer]  

## 2017-06-04 NOTE — Progress Notes (Signed)
Patient ID: Connie Russell, female    DOB: 04/08/1971  MRN: [id number]  CC: Hypertension and Diabetes   Subjective: Connie Russell is a 46 y.o. female who presents for chronic disease management. Last seen in May of this year. Her concerns today include:  Patient with history of diabetes type 2, HTN, constipation, vitamin D deficiency.  DM: not checking consistently -takes Metformin and tolerating it ok -walks 3 x a wk for exercise -due for eye exam. + blurred vision  HTN: compliant with Norvasc. -limits salt in foods -no CP/SOB/LE edema  Taking vitamin D 400 IUs daily  Some intermittent dizziness when laying down. Last episodes were in May and August.  -episodes last few seconds and Patient Active Problem List   Diagnosis Date Noted  . Diabetes mellitus type 2, controlled (HCC) 03/18/2016  . Gastritis 09/16/2015  . Abdominal pain 09/16/2015  . Dyspepsia 06/13/2015  . Constipation 01/15/2014  . GERD (gastroesophageal reflux disease) 01/15/2014  . Hypertension, essential 08/14/2013  . Hyperglycemia 08/14/2013  . Other and unspecified hyperlipidemia 08/14/2013  . Preventative health care 07/14/2013     Current Outpatient Prescriptions on File Prior to Visit  Medication Sig Dispense Refill  . aspirin EC 81 MG tablet Take 1 tablet (81 mg total) by mouth daily. 30 tablet 11  . Vitamin D, Cholecalciferol, 400 units CAPS Take 400 Units by mouth every morning. (Patient not taking: Reported on 06/04/2017) 90 capsule 0   No current facility-administered medications on file prior to visit.     Allergies  Allergen Reactions  . Linzess [Linaclotide]     DIARRHEA AT 145 MICROG    Social History   Social History  . Marital status: Married    Spouse name: N/A  . Number of children: N/A  . Years of education: N/A   Occupational History  . Not on file.   Social History Main Topics  . Smoking status: Never Smoker  . Smokeless tobacco: Never Used   Comment: Never smoked  . Alcohol use No  . Drug use: No  . Sexual activity: Yes    Birth control/ protection: IUD   Other Topics Concern  . 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    Family History  Problem Relation Age of Onset  . Hypertension Mother   . Diabetes Maternal Grandmother   . Colon cancer Neg Hx     Past Surgical History:  Procedure Laterality Date  . ESOPHAGOGASTRODUODENOSCOPY N/A 06/24/2015   SLF: 1. Abdominal pain most likely due to constipation, GERD, and Gastritis. 2. MIld NON-erosive gastritis.  Marland Kitchen FOOT SURGERY Left 2000   Local    ROS: Review of Systems Neg except as above PHYSICAL EXAM: BP 139/87   Pulse 95   Temp 98 F (36.7 C) (Oral)   Resp 16   Wt 142 lb 12.8 oz (64.8 kg)   SpO2 98%   BMI 24.51 kg/m   Wt Readings from Last 3 Encounters:  06/04/17 142 lb 12.8 oz (64.8 kg)  03/04/17 140 lb (63.5 kg)  01/01/17 143 lb 6.4 oz (65 kg)   Physical Exam General appearance - alert, well appearing, and in no distress Mental status - alert, oriented to person, place, and time Mouth - mucous membranes moist, pharynx normal without lesions Neck - supple, no significant adenopathy Chest - clear to auscultation, no wheezes, rales or rhonchi, symmetric air  entry Heart - normal rate, regular rhythm, normal S1, S2, no murmurs, rubs, clicks or gallops Extremities - peripheral pulses normal, no pedal edema, no clubbing or cyanosis  Results for orders placed or performed in visit on 06/04/17  POCT glucose (manual entry)  Result Value Ref Range   POC Glucose 146 (A) 70 - 99 mg/dl  POCT glycosylated hemoglobin (Hb A1C)  Result Value Ref Range   Hemoglobin A1C 6.8     ASSESSMENT AND PLAN: 1. Controlled type 2 diabetes mellitus without complication, without long-term current use of insulin (HCC) Continue metformin. Continue healthy eating habits and regular exercise - POCT glucose (manual  entry) - POCT glycosylated hemoglobin (Hb A1C) - metFORMIN (GLUCOPHAGE) 500 MG tablet; Take 1 tablet (500 mg total) by mouth daily with breakfast.  Dispense: 90 tablet; Refill: 3  2. Essential hypertension, benign At goal. - amLODipine (NORVASC) 10 MG tablet; Take 1 tablet (10 mg total) by mouth daily.  Dispense: 30 tablet; Refill: 6  3. Need for influenza vaccination - Flu Vaccine QUAD 6+ mos PF IM (Fluarix Quad PF)  4. Benign paroxysmal positional vertigo, unspecified laterality -She is not having any episodes now. However we discussed diagnoses and printed information given to patient  Patient was given the opportunity to ask questions.  Patient verbalized understanding of the plan and was able to repeat key elements of the plan.   Orders Placed This Encounter  Procedures  . Flu Vaccine QUAD 6+ mos PF IM (Fluarix Quad PF)  . POCT glucose (manual entry)  . POCT glycosylated hemoglobin (Hb A1C)     Requested Prescriptions   Signed Prescriptions Disp Refills  . amLODipine (NORVASC) 10 MG tablet 30 tablet 6    Sig: Take 1 tablet (10 mg total) by mouth daily.  . metFORMIN (GLUCOPHAGE) 500 MG tablet 90 tablet 3    Sig: Take 1 tablet (500 mg total) by mouth daily with breakfast.    Return in about 4 months (around 10/05/2017).  Jonah Blueeborah Kenry Daubert, MD, FACP

## 2017-06-23 MED FILL — AMLODIPINE BESYLATE 10 MG T: 10 | 30 days supply | Qty: 30 | Fill #0

## 2017-06-23 MED FILL — ?METFORMIN HCL 500MG TABLET: 500 | 30 days supply | Qty: 30 | Fill #0

## 2017-07-21 MED FILL — AMLODIPINE BESYLATE 10 MG T: 10 | 30 days supply | Qty: 30 | Fill #1

## 2017-08-18 MED FILL — AMLODIPINE BESYLATE 10 MG T: 10 | 30 days supply | Qty: 30 | Fill #2

## 2017-08-31 ENCOUNTER — Ambulatory Visit: Payer: Self-pay | Attending: Internal Medicine

## 2017-09-01 ENCOUNTER — Ambulatory Visit: Payer: Self-pay | Attending: Internal Medicine | Admitting: Physician Assistant

## 2017-09-01 VITALS — BP 116/75 | HR 78 | Temp 98.3°F | Resp 16 | Wt 141.6 lb

## 2017-09-01 DIAGNOSIS — Z7982 Long term (current) use of aspirin: Secondary | ICD-10-CM | POA: Insufficient documentation

## 2017-09-01 DIAGNOSIS — Z789 Other specified health status: Secondary | ICD-10-CM

## 2017-09-01 DIAGNOSIS — Z79899 Other long term (current) drug therapy: Secondary | ICD-10-CM | POA: Insufficient documentation

## 2017-09-01 DIAGNOSIS — I10 Essential (primary) hypertension: Secondary | ICD-10-CM | POA: Insufficient documentation

## 2017-09-01 DIAGNOSIS — E119 Type 2 diabetes mellitus without complications: Secondary | ICD-10-CM | POA: Insufficient documentation

## 2017-09-01 DIAGNOSIS — Z888 Allergy status to other drugs, medicaments and biological substances status: Secondary | ICD-10-CM | POA: Insufficient documentation

## 2017-09-01 DIAGNOSIS — B029 Zoster without complications: Secondary | ICD-10-CM | POA: Insufficient documentation

## 2017-09-01 DIAGNOSIS — K219 Gastro-esophageal reflux disease without esophagitis: Secondary | ICD-10-CM | POA: Insufficient documentation

## 2017-09-01 DIAGNOSIS — Z7984 Long term (current) use of oral hypoglycemic drugs: Secondary | ICD-10-CM | POA: Insufficient documentation

## 2017-09-01 LAB — GLUCOSE, POCT (MANUAL RESULT ENTRY): POC GLUCOSE: 132 mg/dL — AB (ref 70–99)

## 2017-09-01 MED ORDER — TRAMADOL HCL 50 MG PO TABS: 50.0000 mg | ORAL_TABLET | Freq: Three times a day (TID) | ORAL | 0 refills | Status: DC | PRN

## 2017-09-01 MED ORDER — GABAPENTIN 300 MG PO CAPS: 300.0000 mg | ORAL_CAPSULE | Freq: Every day | ORAL | 0 refills | Status: DC

## 2017-09-01 MED ORDER — VALACYCLOVIR HCL 1 G PO TABS: 1000.0000 mg | ORAL_TABLET | Freq: Three times a day (TID) | ORAL | 0 refills | Status: DC

## 2017-09-01 MED FILL — GABAPENTIN 300 MG CAPSULE: 300 | 14 days supply | Qty: 14 | Fill #0

## 2017-09-01 MED FILL — ?VALACYCLOVIR HCL 1 GRAM TA: 1 | 7 days supply | Qty: 21 | Fill #0

## 2017-09-01 MED FILL — traMADol HCL 50 MG TABS: 50 | 10 days supply | Qty: 30 | Fill #0

## 2017-09-01 NOTE — Progress Notes (Signed)
Pt states she is having some discomfort on her left side of the face  Pt states she has a burning sensation and when she blows her nose she gets yellow snot

## 2017-09-01 NOTE — Progress Notes (Signed)
Patient ID: Connie Russell, female   DOB: Oct 14, 1970, 47 y.o.   MRN: [id number]     Connie Russell, is a 47 y.o. female  [id number]  [id number]  DOB - Oct 14, 1970  Subjective:  Chief Complaint and HPI: Connie Russell is a 47 y.o. female here today for L facial pain in L side of face and head.  Feels like a burning pain/pins and needles.  Feels kind of bad all over.  Some runny nose on L.  She has not had a rash or checked for fever. Slight runny nose on L nostril.  No vision changes.  [employer] language resources- "Connie Russell" translating.   ROS:   Constitutional:  No f/c, No night sweats, No unexplained weight loss. EENT:  No vision changes, No blurry vision, No hearing changes. No mouth, throat, or ear problems other than slight L ear pain Respiratory: No cough, No SOB Cardiac: No CP, no palpitations GI:  No abd pain, No N/V/D. GU: No Urinary s/sx Musculoskeletal: No joint pain Neuro: +L headache, no dizziness, no motor weakness.  Skin: No rash Endocrine:  No polydipsia. No polyuria.  Psych: Denies SI/HI  No problems updated.  ALLERGIES: Allergies  Allergen Reactions  . Linzess [Linaclotide]     DIARRHEA AT 145 MICROG    PAST MEDICAL HISTORY: Past Medical History:  Diagnosis Date  . Diabetes (HCC)   . Diabetes mellitus without complication (HCC)   . Gastritis   . GERD (gastroesophageal reflux disease)   . Hypertension     MEDICATIONS AT HOME: Prior to Admission medications   Medication Sig Start Date End Date Taking? Authorizing Provider  amLODipine (NORVASC) 10 MG tablet Take 1 tablet (10 mg total) by mouth daily. 06/04/17  Yes Marcine MatarJohnson, Deborah B, MD  aspirin EC 81 MG tablet Take 1 tablet (81 mg total) by mouth daily. 08/26/15  Yes Ambrose FinlandKeck, Valerie A, NP  metFORMIN (GLUCOPHAGE) 500 MG tablet Take 1 tablet (500 mg total) by mouth daily with breakfast. 06/04/17  Yes Marcine MatarJohnson, Deborah B, MD  Vitamin D, Cholecalciferol, 400 units CAPS Take 400  Units by mouth every morning. 01/01/17  Yes Marcine MatarJohnson, Deborah B, MD  gabapentin (NEURONTIN) 300 MG capsule Take 1 capsule (300 mg total) by mouth at bedtime. X 2 weeks 09/01/17   Anders SimmondsMcClung, Kobe Ofallon M, PA-C  traMADol (ULTRAM) 50 MG tablet Take 1 tablet (50 mg total) by mouth every 8 (eight) hours as needed. 09/01/17   Anders SimmondsMcClung, Mozes Sagar M, PA-C  valACYclovir (VALTREX) 1000 MG tablet Take 1 tablet (1,000 mg total) by mouth 3 (three) times daily. 09/01/17   Anders SimmondsMcClung, Tahara Ruffini M, PA-C     Objective:  EXAM:   Vitals:   09/01/17 1000  BP: 116/75  Pulse: 78  Resp: 16  Temp: 98.3 F (36.8 C)  TempSrc: Oral  SpO2: 100%  Weight: 141 lb 9.6 oz (64.2 kg)    General appearance : A&OX3. NAD. Non-toxic-appearing HEENT: Atraumatic and Normocephalic.  PERRLA. EOM intact.  TM clear B. Mouth-MMM, post pharynx WNL w/o erythema, No PND. Neck: supple, no JVD. No cervical lymphadenopathy. No thyromegaly Chest/Lungs:  Breathing-non-labored, Good air entry bilaterally, breath sounds normal without rales, rhonchi, or wheezing  CVS: S1 S2 regular, no murmurs, gallops, rubs  Extremities: Bilateral Lower Ext shows no edema, both legs are warm to touch with = pulse throughout Neurology:  CN II-XII grossly intact, Non focal.   Psych:  TP linear. J/I WNL. Normal speech. Appropriate eye contact and affect.  Skin:  No Rash on  face or nose tip  Data Review Lab Results  Component Value Date   HGBA1C 6.8 06/04/2017   HGBA1C 6.6 01/01/2017   HGBA1C 6.0 03/18/2016     Assessment & Plan   1. Herpes zoster without complication-likely prodrome of shingles-no red flags on exam today start- valACYclovir (VALTREX) 1000 MG tablet; Take 1 tablet (1,000 mg total) by mouth 3 (three) times daily.  Dispense: 21 tablet; Refill: 0 - traMADol (ULTRAM) 50 MG tablet; Take 1 tablet (50 mg total) by mouth every 8 (eight) hours as needed.  Dispense: 30 tablet; Refill: 0 - gabapentin (NEURONTIN) 300 MG capsule; Take 1 capsule (300 mg total)  by mouth at bedtime. X 2 weeks  Dispense: 14 capsule; Refill: 0 Spent face to face with patient education, answering question, and explaining to watch for nasal lesions and if she develops these, to seek medical attention immediately.   2. Controlled type 2 diabetes mellitus without complication, without long-term current use of insulin (HCC) Keep f/up appt in 3 weeks - POCT glucose (manual entry)  3. Language barrier Scientist, research (life sciences) used  Patient have been counseled extensively about nutrition and exercise  Return for keep 09/23/2017 appt..  The patient was given clear instructions to go to ER or return to medical center if symptoms don't improve, worsen or new problems develop. The patient verbalized understanding. The patient was told to call to get lab results if they haven't heard anything in the next week.     Connie Co, PA-C [facility] [place], [place] [phone]   09/01/2017, 10:27 AM

## 2017-09-01 NOTE — Patient Instructions (Addendum)
If you get a lesion on the tip of your nose, seek medical care at an Urgent Care or the emergency room    Culebrilla (Shingles) La culebrilla es una infeccin que causa una erupcin dolorosa en la piel y ampollas llenas de lquido. La culebrilla est causada por el mismo virus que causa la varicela. Solo se manifiesta en personas que:  Tuvieron varicela.  Recibieron la vacuna contra la varicela. (Esto es poco frecuente). Los primeros sntomas de la culebrilla pueden ser picazn, hormigueo o dolor en una zona de la piel. Luego de The Mutual of Omahaunos das o semanas aparecer una erupcin cutnea. La erupcin suele presentarse en un solo lado del cuerpo en un patrn con forma de cinto o de banda. Con el tiempo, la erupcin se convierte en ampollas llenas de lquido que se abren, forman costras y se secan. Los medicamentos pueden DIRECTVtener los siguientes efectos:  Ayudar a Human resources officercontrolar el dolor.  Lograr una recuperacin ms rpida.  Prevenir problemas a Air cabin crewlargo plazo. CUIDADOS EN EL HOGAR Medicamentos  Tome los medicamentos solamente como se lo haya indicado el mdico.  Aplique una crema para calmar la picazn o con anestesia en la zona afectada, como se lo haya indicado el mdico. Cuidado de la erupcin y las [address]oll[address]  Tome un bao de agua fra o aplique compresas fras en la zona de la erupcin o las ampollas como se lo haya indicado el mdico. Esto Engineer, materialsaliviar el dolor y Higher education careers adviserla picazn.  Mantenga la zona de la erupcin cubierta con una venda (vendaje). Use ropas sueltas.  Mantenga limpia la zona de la erupcin y las ampollas con [place] Territoryjabn suave y agua fra, o como se lo haya indicado el mdico.  Controle la erupcin todos los [address]  [address] para detectar signos de infeccin. Estos signos incluyen enrojecimiento, hinchazn o dolor que perduran o empeoran.  No pellizque las ampollas.  No se rasque la zona de la erupcin. Instrucciones generales  Haga reposo tal como le indic el mdico.  Concurra a todas las visitas de  control como se lo haya indicado el mdico. Esto es importante.  Hasta tanto las ampollas formen costras, la infeccin puede causar varicela en las personas que nunca la tuvieron o no se vacunaron contra la varicela. Para impedir que esto sucede, evite tocar a Economistotras personas o estar cerca de otras personas, en especial: ? Bebs. ? Embarazadas. ? Nios que Radio broadcast assistanttienen eccema. ? Personas mayores que han recibido un trasplante. ? Personas con enfermedades crnicas, como leucemia y sida. SOLICITE AYUDA SI:  El dolor no mejora con medicamentos.  El dolor no mejora despus de que la erupcin desaparece.  La erupcin parece infectada. Los signos de infeccin son los siguiente: ? Enrojecimiento. ? Hinchazn. ? Dolor que perdura o Moshannonempeora. SOLICITE AYUDA DE INMEDIATO SI:  La erupcin aparece en el rostro o la nariz.  Tiene dolor en el rostro, en la zona de los ojos o prdida de la sensibilidad en un lado del rostro.  Siente dolor o un zumbido en el odo.  Tiene prdida del gusto.  La afeccin empeora. Esta informacin no tiene Theme park managercomo fin reemplazar el consejo del mdico. Asegrese de hacerle al mdico cualquier pregunta que tenga. Document Released: 10/23/2008 Document Revised: 11/18/2015 Document Reviewed: 05/08/2014 Elsevier Interactive Patient Education  [employer]2018 Elsevier Inc.

## 2017-09-21 MED FILL — ?AMLODIPINE BESYLATE 10MG T: 10 | 30 days supply | Qty: 30 | Fill #3

## 2017-09-23 ENCOUNTER — Encounter: Payer: Self-pay | Admitting: Internal Medicine

## 2017-09-23 ENCOUNTER — Ambulatory Visit: Payer: Self-pay | Attending: Internal Medicine | Admitting: Internal Medicine

## 2017-09-23 VITALS — BP 113/74 | HR 75 | Temp 98.2°F | Resp 16 | Wt 141.2 lb

## 2017-09-23 DIAGNOSIS — K5900 Constipation, unspecified: Secondary | ICD-10-CM | POA: Insufficient documentation

## 2017-09-23 DIAGNOSIS — K219 Gastro-esophageal reflux disease without esophagitis: Secondary | ICD-10-CM | POA: Insufficient documentation

## 2017-09-23 DIAGNOSIS — R1013 Epigastric pain: Secondary | ICD-10-CM

## 2017-09-23 DIAGNOSIS — E119 Type 2 diabetes mellitus without complications: Secondary | ICD-10-CM

## 2017-09-23 DIAGNOSIS — Z7982 Long term (current) use of aspirin: Secondary | ICD-10-CM | POA: Insufficient documentation

## 2017-09-23 DIAGNOSIS — K2970 Gastritis, unspecified, without bleeding: Secondary | ICD-10-CM | POA: Insufficient documentation

## 2017-09-23 DIAGNOSIS — Z975 Presence of (intrauterine) contraceptive device: Secondary | ICD-10-CM | POA: Insufficient documentation

## 2017-09-23 DIAGNOSIS — E785 Hyperlipidemia, unspecified: Secondary | ICD-10-CM | POA: Insufficient documentation

## 2017-09-23 DIAGNOSIS — Z833 Family history of diabetes mellitus: Secondary | ICD-10-CM | POA: Insufficient documentation

## 2017-09-23 DIAGNOSIS — Z9889 Other specified postprocedural states: Secondary | ICD-10-CM | POA: Insufficient documentation

## 2017-09-23 DIAGNOSIS — Z8249 Family history of ischemic heart disease and other diseases of the circulatory system: Secondary | ICD-10-CM | POA: Insufficient documentation

## 2017-09-23 DIAGNOSIS — H8110 Benign paroxysmal vertigo, unspecified ear: Secondary | ICD-10-CM | POA: Insufficient documentation

## 2017-09-23 DIAGNOSIS — E559 Vitamin D deficiency, unspecified: Secondary | ICD-10-CM | POA: Insufficient documentation

## 2017-09-23 DIAGNOSIS — Z79899 Other long term (current) drug therapy: Secondary | ICD-10-CM | POA: Insufficient documentation

## 2017-09-23 DIAGNOSIS — I10 Essential (primary) hypertension: Secondary | ICD-10-CM

## 2017-09-23 DIAGNOSIS — Z888 Allergy status to other drugs, medicaments and biological substances status: Secondary | ICD-10-CM | POA: Insufficient documentation

## 2017-09-23 DIAGNOSIS — R109 Unspecified abdominal pain: Secondary | ICD-10-CM | POA: Insufficient documentation

## 2017-09-23 LAB — GLUCOSE, POCT (MANUAL RESULT ENTRY): POC GLUCOSE: 119 mg/dL — AB (ref 70–99)

## 2017-09-23 MED ORDER — AMLODIPINE BESYLATE 10 MG PO TABS: 10.0000 mg | ORAL_TABLET | Freq: Every day | ORAL | 6 refills | Status: DC

## 2017-09-23 MED ORDER — OMEPRAZOLE 20 MG PO CPDR: 20.0000 mg | DELAYED_RELEASE_CAPSULE | Freq: Every day | ORAL | 3 refills | Status: DC

## 2017-09-23 MED ORDER — METFORMIN HCL 500 MG PO TABS: 500.0000 mg | ORAL_TABLET | Freq: Every day | ORAL | 3 refills | Status: DC

## 2017-09-23 MED FILL — ?OMEPRAZOLE 20 MG CPDR: 20 | 30 days supply | Qty: 30 | Fill #0

## 2017-09-23 MED FILL — ?METFORMIN HCL 500MG TABLET: 500 | 30 days supply | Qty: 30 | Fill #1

## 2017-09-23 NOTE — Progress Notes (Signed)
Patient ID: Connie Russell, female    DOB: [birth year]  MRN: [id number]  CC: Abdominal Pain and Diabetes   Subjective: Connie Russell is a 47 y.o. female who presents for chronic ds management.  Last seen 05/2017 Her concerns today include:  Patient with history of diabetes type 2, HTN, constipation, vitamin D deficiency.  1.  Saw by PA last mth  States she was having rhinorrhea with sharp pain LT side of nose.  Given meds for possible early zoster Now stomach upset and feels inflamed -Positive bloating -denies use of OTC NSAIDs  2.  DM: does not check BS often.  Range 120-160 No exercise for past 3 wks. Plans to start once weather warms up Due for eye exam.  No insurance.  No blurred vision. Does okay with eating habits  3.  HTN: compliant with med and salt restriction No CP/SOB/LE edmea Patient Active Problem List   Diagnosis Date Noted  . Benign paroxysmal positional vertigo 06/04/2017  . Diabetes mellitus type 2, controlled (HCC) 03/18/2016  . Gastritis 09/16/2015  . Dyspepsia 06/13/2015  . Constipation 01/15/2014  . GERD (gastroesophageal reflux disease) 01/15/2014  . Hypertension, essential 08/14/2013  . Other and unspecified hyperlipidemia 08/14/2013  . Preventative health care 07/14/2013     Current Outpatient Medications on File Prior to Visit  Medication Sig Dispense Refill  . aspirin EC 81 MG tablet Take 1 tablet (81 mg total) by mouth daily. 30 tablet 11  . Vitamin D, Cholecalciferol, 400 units CAPS Take 400 Units by mouth every morning. 90 capsule 0   No current facility-administered medications on file prior to visit.     Allergies  Allergen Reactions  . Linzess [Linaclotide]     DIARRHEA AT 145 [address]    Social History   Socioeconomic History  . Marital status: Married    Spouse name: Not on file  . Number of children: Not on file  . Years of education: Not on file  . Highest education level: Not on file  Social Needs  .  Financial resource strain: Not on file  . Food insecurity - worry: Not on file  . Food insecurity - inability: Not on file  . Transportation needs - medical: Not on file  . Transportation needs - non-medical: Not on file  Occupational History  . Not on file  Tobacco Use  . Smoking status: Never Smoker  . Smokeless tobacco: Never Used  . Tobacco comment: Never smoked  Substance and Sexual Activity  . Alcohol use: No    Alcohol/week: 0.0 oz  . Drug use: No  . Sexual activity: Yes    Birth control/protection: IUD  Other Topics Concern  . 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    Family History  Problem Relation Age of Onset  . Hypertension Mother   . Diabetes Maternal Grandmother   . Colon cancer Neg Hx     Past Surgical History:  Procedure Laterality Date  . ESOPHAGOGASTRODUODENOSCOPY N/A 06/24/2015   SLF: 1. Abdominal pain most likely due to constipation, GERD, and Gastritis. 2. MIld NON-erosive gastritis.  Marland Kitchen FOOT SURGERY Left 2000   Local    ROS: Review of Systems Neg except as above  PHYSICAL EXAM: BP 113/74   Pulse 75   Temp 98.2 F (36.8 C) (Oral)   Resp 16   Wt 141 lb 3.2 oz (64 kg)  SpO2 100%   BMI 24.24 kg/m   Wt Readings from Last 3 Encounters:  09/23/17 141 lb 3.2 oz (64 kg)  09/01/17 141 lb 9.6 oz (64.2 kg)  06/04/17 142 lb 12.8 oz (64.8 kg)    Physical Exam  General appearance - alert, well appearing, and in no distress Mental status - alert, oriented to person, place, and time, normal mood, behavior, speech, dress, motor activity, and thought processes Neck - supple, no significant adenopathy Chest - clear to auscultation, no wheezes, rales or rhonchi, symmetric air entry Heart - normal rate, regular rhythm, normal S1, S2, no murmurs, rubs, clicks or gallops Abdomen - soft, nontender, nondistended, no masses or organomegaly Extremities - peripheral pulses normal, no pedal  edema, no clubbing or cyanosis   Results for orders placed or performed in visit on 09/01/17  POCT glucose (manual entry)  Result Value Ref Range   POC Glucose 132 (A) 70 - 99 mg/dl   BS [id number] ASSESSMENT AND PLAN: 1. Controlled type 2 diabetes mellitus without complication, without long-term current use of insulin (HCC) Cont Metformin Try to get in exercise 3 x a wk - POCT glucose (manual entry) - Hemoglobin A1c - Lipid panel - Hepatic Function Panel - metFORMIN (GLUCOPHAGE) 500 MG tablet; Take 1 tablet (500 mg total) by mouth daily with breakfast.  Dispense: 90 tablet; Refill: 3  2. Essential hypertension At goal - amLODipine (NORVASC) 10 MG tablet; Take 1 tablet (10 mg total) by mouth daily.  Dispense: 60 tablet; Refill: 6  3. Dyspepsia -stop Tramadol and Gabapentin - omeprazole (PRILOSEC) 20 MG capsule; Take 1 capsule (20 mg total) by mouth daily.  Dispense: 30 capsule; Refill: 3  Patient was given the opportunity to ask questions.  Patient verbalized understanding of the plan and was able to repeat key elements of the plan.  [employer] interpreter used during this encounter.   Orders Placed This Encounter  Procedures  . Hemoglobin A1c  . Lipid panel  . Hepatic Function Panel  . POCT glucose (manual entry)     Requested Prescriptions   Signed Prescriptions Disp Refills  . omeprazole (PRILOSEC) 20 MG capsule 30 capsule 3    Sig: Take 1 capsule (20 mg total) by mouth daily.  Marland Kitchen. amLODipine (NORVASC) 10 MG tablet 60 tablet 6    Sig: Take 1 tablet (10 mg total) by mouth daily.  . metFORMIN (GLUCOPHAGE) 500 MG tablet 90 tablet 3    Sig: Take 1 tablet (500 mg total) by mouth daily with breakfast.    Return in about 4 months (around 01/21/2018).  Jonah Blueeborah Austina Constantin, MD, FACP

## 2017-09-23 NOTE — Patient Instructions (Signed)
Enfermedad por reflujo gastroesofgico en los adultos (Gastroesophageal Reflux Disease, Adult) Normalmente, los alimentos descienden por el esfago y se depositan en el estmago para su digestin. Si una persona tiene enfermedad por reflujo gastroesofgico (ERGE), los alimentos y el cido estomacal regresan al esfago. Cuando esto ocurre, el esfago se irrita y se hincha (inflama). Con el tiempo, la ERGE puede provocar la formacin de pequeas perforaciones (lceras) en la mucosa del esfago. CUIDADOS EN EL HOGAR Dieta  Siga la dieta como se lo haya indicado el mdico. Tal vez deba evitar los siguientes alimentos y bebidas: ? Caf y t (con o sin cafena). ? Bebidas que contengan alcohol. ? Bebidas energizantes y deportivas. ? Gaseosas o refrescos. ? Chocolate y cacao. ? Menta y esencias de menta. ? Ajo y cebollas. ? Rbano picante. ? Alimentos muy condimentados y cidos, como pimientos, chile en polvo, curry en polvo, vinagre, salsas picantes y salsa barbacoa. ? Frutas ctricas y sus jugos, como naranjas, limones y limas. ? Alimentos a base de tomates, como salsa roja, chile, salsa y pizza con salsa roja. ? Alimentos fritos y grasos, como rosquillas, papas fritas y aderezos con alto contenido de grasa. ? Carnes con alto contenido de grasa, como hot dogs, filetes de entrecot, salchicha, jamn y tocino. ? Productos lcteos con alto contenido de grasa, como leche entera, mantequilla y queso crema.  Consuma pequeas porciones de comida con ms frecuencia. Evite consumir porciones abundantes.  Evite beber mucho lquido con las comidas.  No coma durante las 2 o 3horas previas a la hora de acostarse.  No se acueste inmediatamente despus de comer.  No haga actividad fsica enseguida despus de comer. Instrucciones generales  Est atento a cualquier cambio en los sntomas.  Tome los medicamentos de venta libre y los recetados solamente como se lo haya indicado el mdico. No tome  aspirina, ibuprofeno ni otros antiinflamatorios no esteroides (AINE), a menos que el mdico lo autorice.  No consuma ningn producto que contenga tabaco, lo que incluye cigarrillos, tabaco de mascar y cigarrillos electrnicos. Si necesita ayuda para dejar de fumar, consulte al mdico.  Use ropa suelta. No use nada ajustado alrededor de la cintura.  Levante (eleve) unas 6pulgadas (15centmetros) la cabecera de la cama.  Intente bajar el nivel de estrs. Si necesita ayuda para hacerlo, consulte al mdico.  Si tiene sobrepeso, adelgace hasta alcanzar un peso saludable. Pregntele a su mdico cmo puede perder peso de manera segura.  Concurra a todas las visitas de control como se lo haya indicado el mdico. Esto es importante. SOLICITE AYUDA SI:  Aparecen nuevos sntomas.  Baja de peso y no sabe por qu.  Tiene dificultad para tragar o siente dolor al hacerlo.  Tiene sibilancias o tos que no desaparece.  Los sntomas no mejoran con el tratamiento.  Tiene la voz ronca. SOLICITE AYUDA DE INMEDIATO SI:  Tiene dolor en los brazos, el cuello, los maxilares, la dentadura o la espalda.  Transpira, se marea o tiene sensacin de desvanecimiento.  Siente falta de aire o dolor en el pecho.  Vomita y el vmito es parecido a la sangre o a los granos de caf.  Pierde el conocimiento (se desmaya).  Las heces son sanguinolentas o de color negro.  No puede tragar, beber o comer. Esta informacin no tiene como fin reemplazar el consejo del mdico. Asegrese de hacerle al mdico cualquier pregunta que tenga. Document Released: 08/29/2010 Document Revised: 04/17/2015 Document Reviewed: 11/21/2014 Elsevier Interactive Patient Education  2018 Elsevier Inc.  

## 2017-09-24 LAB — HEMOGLOBIN A1C
Est. average glucose Bld gHb Est-mCnc: 143 mg/dL
HEMOGLOBIN A1C: 6.6 % — AB (ref 4.8–5.6)

## 2017-09-24 LAB — HEPATIC FUNCTION PANEL
ALT: 24 IU/L (ref 0–32)
AST: 16 IU/L (ref 0–40)
Albumin: 4.8 g/dL (ref 3.5–5.5)
Alkaline Phosphatase: 90 IU/L (ref 39–117)
BILIRUBIN, DIRECT: 0.07 mg/dL (ref 0.00–0.40)
TOTAL PROTEIN: 7.6 g/dL (ref 6.0–8.5)

## 2017-09-24 LAB — LIPID PANEL
CHOL/HDL RATIO: 3.8 ratio (ref 0.0–4.4)
Cholesterol, Total: 143 mg/dL (ref 100–199)
HDL: 38 mg/dL — ABNORMAL LOW (ref >39)
LDL Calculated: 78 mg/dL (ref 0–99)
TRIGLYCERIDES: 137 mg/dL (ref 0–149)
VLDL Cholesterol Cal: 27 mg/dL (ref 5–40)

## 2017-09-27 ENCOUNTER — Telehealth: Payer: Self-pay

## 2017-09-27 NOTE — Telephone Encounter (Signed)
[employer] [place] ID# [id number] contacted pt to go over lab results pt didn't answer left a detailed vm regarding results and if she has any questions or concerns to give me a call   If pt calls back please give results: diabetes is under good control. Cholesterol level is normal. Liver function tests normal.

## 2017-10-20 MED FILL — AMLODIPINE BESYLATE 10 MG T: 10 | 30 days supply | Qty: 30 | Fill #4

## 2017-11-17 MED FILL — AMLODIPINE BESYLATE 10 MG T: 10 | 30 days supply | Qty: 30 | Fill #5

## 2017-11-17 MED FILL — ?METFORMIN HCL 500MG TABLET: 500 | 30 days supply | Qty: 30 | Fill #2

## 2017-12-17 MED FILL — ?METFORMIN HCL 500MG TABLET: 500 | 30 days supply | Qty: 30 | Fill #3

## 2017-12-17 MED FILL — AMLODIPINE BESYLATE 10 MG T: 10 | 30 days supply | Qty: 30 | Fill #6

## 2018-01-19 MED FILL — AMLODIPINE BESYLATE 10 MG T: 10 | 30 days supply | Qty: 30 | Fill #0

## 2018-02-07 ENCOUNTER — Ambulatory Visit: Payer: Self-pay | Attending: Family Medicine

## 2018-02-08 ENCOUNTER — Other Ambulatory Visit: Payer: Self-pay | Admitting: Obstetrics & Gynecology

## 2018-02-08 DIAGNOSIS — Z1231 Encounter for screening mammogram for malignant neoplasm of breast: Secondary | ICD-10-CM

## 2018-02-16 MED FILL — AMLODIPINE BESYLATE 10 MG T: 10 | 30 days supply | Qty: 30 | Fill #1

## 2018-02-25 ENCOUNTER — Other Ambulatory Visit: Payer: Self-pay | Admitting: Obstetrics and Gynecology

## 2018-02-25 DIAGNOSIS — Z1231 Encounter for screening mammogram for malignant neoplasm of breast: Secondary | ICD-10-CM

## 2018-03-10 ENCOUNTER — Encounter: Payer: Self-pay | Admitting: Internal Medicine

## 2018-03-10 ENCOUNTER — Ambulatory Visit: Payer: Self-pay | Attending: Internal Medicine | Admitting: Internal Medicine

## 2018-03-10 VITALS — BP 129/79 | HR 71 | Temp 98.1°F | Resp 16 | Wt 146.4 lb

## 2018-03-10 DIAGNOSIS — R0789 Other chest pain: Secondary | ICD-10-CM | POA: Insufficient documentation

## 2018-03-10 DIAGNOSIS — K5900 Constipation, unspecified: Secondary | ICD-10-CM | POA: Insufficient documentation

## 2018-03-10 DIAGNOSIS — E1165 Type 2 diabetes mellitus with hyperglycemia: Secondary | ICD-10-CM | POA: Insufficient documentation

## 2018-03-10 DIAGNOSIS — E119 Type 2 diabetes mellitus without complications: Secondary | ICD-10-CM

## 2018-03-10 DIAGNOSIS — H538 Other visual disturbances: Secondary | ICD-10-CM | POA: Insufficient documentation

## 2018-03-10 DIAGNOSIS — K2970 Gastritis, unspecified, without bleeding: Secondary | ICD-10-CM | POA: Insufficient documentation

## 2018-03-10 DIAGNOSIS — Z79899 Other long term (current) drug therapy: Secondary | ICD-10-CM | POA: Insufficient documentation

## 2018-03-10 DIAGNOSIS — I10 Essential (primary) hypertension: Secondary | ICD-10-CM | POA: Insufficient documentation

## 2018-03-10 DIAGNOSIS — Z975 Presence of (intrauterine) contraceptive device: Secondary | ICD-10-CM | POA: Insufficient documentation

## 2018-03-10 DIAGNOSIS — E785 Hyperlipidemia, unspecified: Secondary | ICD-10-CM | POA: Insufficient documentation

## 2018-03-10 DIAGNOSIS — E559 Vitamin D deficiency, unspecified: Secondary | ICD-10-CM | POA: Insufficient documentation

## 2018-03-10 DIAGNOSIS — IMO0001 Reserved for inherently not codable concepts without codable children: Secondary | ICD-10-CM

## 2018-03-10 DIAGNOSIS — Z7982 Long term (current) use of aspirin: Secondary | ICD-10-CM | POA: Insufficient documentation

## 2018-03-10 DIAGNOSIS — K219 Gastro-esophageal reflux disease without esophagitis: Secondary | ICD-10-CM | POA: Insufficient documentation

## 2018-03-10 DIAGNOSIS — Z8249 Family history of ischemic heart disease and other diseases of the circulatory system: Secondary | ICD-10-CM | POA: Insufficient documentation

## 2018-03-10 LAB — POCT GLYCOSYLATED HEMOGLOBIN (HGB A1C): HbA1c, POC (controlled diabetic range): 7.8 % — AB (ref 0.0–7.0)

## 2018-03-10 LAB — GLUCOSE, POCT (MANUAL RESULT ENTRY): POC GLUCOSE: 227 mg/dL — AB (ref 70–99)

## 2018-03-10 MED ORDER — NAPROXEN 500 MG PO TABS: 500.0000 mg | ORAL_TABLET | Freq: Two times a day (BID) | ORAL | 0 refills | Status: DC | PRN

## 2018-03-10 MED ORDER — METFORMIN HCL 500 MG PO TABS: 500.0000 mg | ORAL_TABLET | Freq: Two times a day (BID) | ORAL | 3 refills | Status: DC

## 2018-03-10 MED FILL — NAPROXEN 500 MG TABLET: 500 | 7 days supply | Qty: 15 | Fill #0

## 2018-03-10 MED FILL — metFORMIN HCL 500 MG TABS: 500 | 30 days supply | Qty: 60 | Fill #0

## 2018-03-10 NOTE — Progress Notes (Signed)
Patient ID: Connie Russell, female    DOB: 1971/04/18  MRN: [id number]  CC: No chief complaint on file.   Subjective: Connie Russell is a 47 y.o. female who presents for chronic ds management Her concerns today include:  Patient with history of diabetes type 2, HTN, constipation, vitamin D deficiency.  C/o intermittent pain in LT upper anterior chest close to the shoulder over the clavicle.  No edema or erythema in the area. -no initiating factors -worse with arm movement; no radiation to arm or neck.  No associated SOB -takes an ASA which helps.   DM: does not check blood sugars. Does okay with eating habits; not getting in much exercise over past 2 wks - has not had time.  Gained 5 lbs since last visit -endorses blurred vision.  She has not been able to afford eye exam  -compliant with Metformin  HTN: compliant with Amlodipine and salt restriction.  Denies any chest pains or shortness of breath.  No lower extremity edema.  HM: had [facility] test done 1 mth ago at [facility] and reports it was negative.  Patient Active Problem List   Diagnosis Date Noted  . Benign paroxysmal positional vertigo 06/04/2017  . Diabetes mellitus type 2, controlled (HCC) 03/18/2016  . Gastritis 09/16/2015  . Dyspepsia 06/13/2015  . Constipation 01/15/2014  . GERD (gastroesophageal reflux disease) 01/15/2014  . Hypertension, essential 08/14/2013  . Other and unspecified hyperlipidemia 08/14/2013  . Preventative health care 07/14/2013     Current Outpatient Medications on File Prior to Visit  Medication Sig Dispense Refill  . amLODipine (NORVASC) 10 MG tablet Take 1 tablet (10 mg total) by mouth daily. 60 tablet 6  . aspirin EC 81 MG tablet Take 1 tablet (81 mg total) by mouth daily. 30 tablet 11  . omeprazole (PRILOSEC) 20 MG capsule Take 1 capsule (20 mg total) by mouth daily. 30 capsule 3  . Vitamin D, Cholecalciferol, 400 units CAPS Take 400 Units by mouth every morning. 90  capsule 0   No current facility-administered medications on file prior to visit.     Allergies  Allergen Reactions  . Linzess [Linaclotide]     DIARRHEA AT 145 MICROG    Social History   Socioeconomic History  . Marital status: Married    Spouse name: Not on file  . Number of children: Not on file  . Years of education: Not on file  . Highest education level: Not on file  Occupational History  . Not on file  Social Needs  . Financial resource strain: Not on file  . Food insecurity:    Worry: Not on file    Inability: Not on file  . Transportation needs:    Medical: Not on file    Non-medical: Not on file  Tobacco Use  . Smoking status: Never Smoker  . Smokeless tobacco: Never Used  . Tobacco comment: Never smoked  Substance and Sexual Activity  . Alcohol use: No    Alcohol/week: 0.0 oz  . Drug use: No  . Sexual activity: Yes    Birth control/protection: IUD  Lifestyle  . Physical activity:    Days per week: Not on file    Minutes per session: Not on file  . Stress: Not on file  Relationships  . Social connections:    Talks on phone: Not on file    Gets together: Not on file    Attends religious service: Not on file    Active member  of club or organization: Not on file    Attends meetings of clubs or organizations: Not on file    Relationship status: Not on file  . Intimate partner violence:    Fear of current or ex partner: Not on file    Emotionally abused: Not on file    Physically abused: Not on file    Forced sexual activity: Not on file  Other Topics Concern  . Not on file  Social History Narrative   ** Merged History Encounter **       [place]    Family History  Problem Relation Age of Onset  . Hypertension Mother   . Diabetes Maternal Grandmother   . Colon cancer Neg Hx     Past Surgical History:  Procedure Laterality Date  . ESOPHAGOGASTRODUODENOSCOPY N/A 06/24/2015   SLF: 1. Abdominal pain most likely due to  constipation, GERD, and Gastritis. 2. MIld NON-erosive gastritis.  Marland Kitchen. FOOT SURGERY Left 2000   Local    ROS: Review of Systems Negative except as stated above. PHYSICAL EXAM: BP 129/79   Pulse 71   Temp 98.1 F (36.7 C) (Oral)   Resp 16   Wt 146 lb 6.4 oz (66.4 kg)   SpO2 99%   BMI 25.13 kg/m   Wt Readings from Last 3 Encounters:  03/10/18 146 lb 6.4 oz (66.4 kg)  09/23/17 141 lb 3.2 oz (64 kg)  09/01/17 141 lb 9.6 oz (64.2 kg)   Physical Exam General appearance - alert, well appearing, and in no distress Mental status - alert, oriented to person, place, and time, normal mood, behavior, speech, dress, motor activity, and thought processes Neck - supple, no significant adenopathy.  No redness or tenderness of the left side of the neck. Chest - clear to auscultation, no wheezes, rales or rhonchi, symmetric air entry Heart - normal rate, regular rhythm, normal S1, S2, no murmurs, rubs, clicks or gallops Abdomen - soft, nontender, nondistended, no masses or organomegaly Extremities - peripheral pulses normal, no pedal edema, no clubbing or cyanosis MSK: Mild tenderness just below the lateral left clavicle.  No overlying edema or erythema.  Mild discomfort with passive range of motion of the left shoulder.  Radial and brachial pulses are equal bilaterally.  Results for orders placed or performed in visit on 03/10/18  POCT glucose (manual entry)  Result Value Ref Range   POC Glucose 227 (A) 70 - 99 mg/dl  POCT glycosylated hemoglobin (Hb A1C)  Result Value Ref Range   Hemoglobin A1C  4.0 - 5.6 %   HbA1c POC (<> result, manual entry)  4.0 - 5.6 %   HbA1c, POC (prediabetic range)  5.7 - 6.4 %   HbA1c, POC (controlled diabetic range) 7.8 (A) 0.0 - 7.0 %    ASSESSMENT AND PLAN: 1. Diabetes mellitus type 2, uncontrolled, without complications (HCC) Discussed the importance of healthy eating habits, regular aerobic exercise (at least 150 minutes a week as tolerated) and medication  compliance to achieve or maintain control of diabetes. -Increase metformin to twice a day. - POCT glucose (manual entry) - POCT glycosylated hemoglobin (Hb A1C) - Microalbumin / creatinine urine ratio - CBC - Basic Metabolic Panel - metFORMIN (GLUCOPHAGE) 500 MG tablet; Take 1 tablet (500 mg total) by mouth 2 (two) times daily with a meal.  Dispense: 180 tablet; Refill: 3  2. Essential hypertension At goal.  Continue amlodipine  3. Atypical chest pain Chest pain is atypical and deemed to be  musculoskeletal in nature.  No signs to suggest soft tissue infection - naproxen (NAPROSYN) 500 MG tablet; Take 1 tablet (500 mg total) by mouth 2 (two) times daily as needed for mild pain.  Dispense: 15 tablet; Refill: 0  4. Blurred vision, bilateral Advised to get eye exam when she is able to forward.  Patient was given the opportunity to ask questions.  Patient verbalized understanding of the plan and was able to repeat key elements of the plan.  [employer] interpreter used during this encounter. #[id number]  Orders Placed This Encounter  Procedures  . Microalbumin / creatinine urine ratio  . CBC  . Basic Metabolic Panel  . POCT glucose (manual entry)  . POCT glycosylated hemoglobin (Hb A1C)     Requested Prescriptions   Signed Prescriptions Disp Refills  . metFORMIN (GLUCOPHAGE) 500 MG tablet 180 tablet 3    Sig: Take 1 tablet (500 mg total) by mouth 2 (two) times daily with a meal.  . naproxen (NAPROSYN) 500 MG tablet 15 tablet 0    Sig: Take 1 tablet (500 mg total) by mouth 2 (two) times daily as needed for mild pain.    Return in about 4 months (around 07/10/2018).  Jonah Blue, MD, FACP

## 2018-03-10 NOTE — Patient Instructions (Addendum)
Increase Metformin to 500 mg twice a day. 

## 2018-03-11 LAB — BASIC METABOLIC PANEL
BUN/Creatinine Ratio: 20 (ref 9–23)
BUN: 13 mg/dL (ref 6–24)
CHLORIDE: 98 mmol/L (ref 96–106)
CO2: 23 mmol/L (ref 20–29)
Calcium: 9.4 mg/dL (ref 8.7–10.2)
Creatinine, Ser: 0.65 mg/dL (ref 0.57–1.00)
GFR calc Af Amer: 124 mL/min/{1.73_m2} (ref >59)
GFR, EST NON AFRICAN AMERICAN: 108 mL/min/{1.73_m2} (ref >59)
GLUCOSE: 217 mg/dL — AB (ref 65–99)
POTASSIUM: 4.1 mmol/L (ref 3.5–5.2)
SODIUM: 134 mmol/L (ref 134–144)

## 2018-03-11 LAB — CBC
Hematocrit: 40.6 % (ref 34.0–46.6)
Hemoglobin: 13.6 g/dL (ref 11.1–15.9)
MCH: 30.7 pg (ref 26.6–33.0)
MCHC: 33.5 g/dL (ref 31.5–35.7)
MCV: 92 fL (ref 79–97)
PLATELETS: 313 10*3/uL (ref 150–450)
RBC: 4.43 x10E6/uL (ref 3.77–5.28)
RDW: 12.1 % — AB (ref 12.3–15.4)
WBC: 7.2 10*3/uL (ref 3.4–10.8)

## 2018-03-11 LAB — MICROALBUMIN / CREATININE URINE RATIO: Creatinine, Urine: 50.2 mg/dL

## 2018-03-18 ENCOUNTER — Telehealth: Payer: Self-pay

## 2018-03-18 MED FILL — AMLODIPINE BESYLATE 10 MG T: 10 | 30 days supply | Qty: 30 | Fill #2

## 2018-03-18 NOTE — Telephone Encounter (Signed)
[employer] karen Id# [phone]  contacted pt to go over lab results pt didn't answer left a detailed vm informing pt of results and if she has any questions or concerns to give me a call   If pt calls back please give results: blood count was normal. Kidney function was normal.

## 2018-04-14 ENCOUNTER — Ambulatory Visit (HOSPITAL_COMMUNITY)
Admission: RE | Admit: 2018-04-14 | Discharge: 2018-04-14 | Disposition: A | Discharge status: Home / Self Care | Payer: Self-pay | Source: Ambulatory Visit | Attending: Obstetrics and Gynecology | Admitting: Obstetrics and Gynecology

## 2018-04-14 ENCOUNTER — Ambulatory Visit
Admission: RE | Admit: 2018-04-14 | Discharge: 2018-04-14 | Disposition: A | Discharge status: Home / Self Care | Payer: No Typology Code available for payment source | Source: Ambulatory Visit | Attending: Obstetrics and Gynecology | Admitting: Obstetrics and Gynecology

## 2018-04-14 ENCOUNTER — Encounter (HOSPITAL_COMMUNITY): Payer: Self-pay

## 2018-04-14 VITALS — BP 120/78 | Wt 145.6 lb

## 2018-04-14 DIAGNOSIS — Z1239 Encounter for other screening for malignant neoplasm of breast: Secondary | ICD-10-CM

## 2018-04-14 DIAGNOSIS — Z1231 Encounter for screening mammogram for malignant neoplasm of breast: Secondary | ICD-10-CM

## 2018-04-14 NOTE — Progress Notes (Signed)
No complaints today.   Pap Smear: Pap smear not completed today. Last Pap smear was in June 2019 at the [facility] and normal per patient. Per patient has a history of an abnormal Pap smear in 2001 that a colposcopy was completed for follow-up. Patient stated that all Pap smears have been normal since and has had at least three normal Pap smears. No Pap smear results are in Epic.  Physical exam: Breasts Breasts symmetrical. No skin abnormalities bilateral breasts. No nipple retraction bilateral breasts. No nipple discharge bilateral breasts. No lymphadenopathy. No lumps palpated bilateral breasts. No complaints of pain or tenderness on exam. Referred patient to the [facility] for a screening mammogram. Appointment scheduled for Thursday, April 14, 2018 at 1510.        Pelvic/Bimanual No Pap smear completed today since last Pap smear was in June 2019 per patient. Pap smear not indicated per BCCCP guidelines.   Smoking History: Patient has never smoked.  Patient Navigation: Patient education provided. Access to services provided for patient through [facility] program. Spanish interpreter provided.   Breast and Cervical Cancer Risk Assessment: Patient has no family history of breast cancer, known genetic mutations, or radiation treatment to the chest before age 60. Per patient has a history of cervical dysplasia. Patient has no history of being immunocompromised or DES exposure in-utero.  Risk Assessment    Risk Scores      04/14/2018   Last edited by: Lynnell Dike, LPN   5-year risk: 0.7 %   Lifetime risk: 8.2 %         Used Spanish interpreter Natale Lay from [place].

## 2018-04-14 NOTE — Patient Instructions (Signed)
Explained breast self awareness with Huntley Dec Aguilar-Reyes. Patient did not need a Pap smear today due to last Pap smear was in June 2019 per patient. Let her know BCCCP will cover Pap smears every 3 years unless has a history of abnormal Pap smears. Referred patient to the [facility] for a screening mammogram. Appointment scheduled for Thursday, April 14, 2018 at 1510. Let patient know the Breast Center will follow up with her within the next couple weeks with results of mammogram by letter or phone. Henrine Screws Y Aguilar-Reyes verbalized understanding.  Tarin Navarez, Kathaleen Maser, RN 2:45 PM

## 2018-04-15 ENCOUNTER — Encounter (HOSPITAL_COMMUNITY): Payer: Self-pay | Admitting: *Deleted

## 2018-04-18 MED FILL — AMLODIPINE BESYLATE 10 MG T: 10 | 30 days supply | Qty: 30 | Fill #3

## 2018-05-19 MED FILL — AMLODIPINE BESYLATE 10 MG T: 10 | 30 days supply | Qty: 30 | Fill #4

## 2018-06-16 MED FILL — AMLODIPINE BESYLATE 10 MG T: 10 | 30 days supply | Qty: 30 | Fill #5

## 2018-07-12 ENCOUNTER — Encounter: Payer: Self-pay | Admitting: Internal Medicine

## 2018-07-12 ENCOUNTER — Telehealth: Payer: Self-pay | Admitting: Internal Medicine

## 2018-07-12 ENCOUNTER — Ambulatory Visit: Payer: Self-pay | Attending: Internal Medicine | Admitting: Internal Medicine

## 2018-07-12 VITALS — BP 125/81 | HR 82 | Temp 97.7°F | Resp 16 | Wt 144.8 lb

## 2018-07-12 DIAGNOSIS — K219 Gastro-esophageal reflux disease without esophagitis: Secondary | ICD-10-CM | POA: Insufficient documentation

## 2018-07-12 DIAGNOSIS — E785 Hyperlipidemia, unspecified: Secondary | ICD-10-CM | POA: Insufficient documentation

## 2018-07-12 DIAGNOSIS — Z79899 Other long term (current) drug therapy: Secondary | ICD-10-CM | POA: Insufficient documentation

## 2018-07-12 DIAGNOSIS — I10 Essential (primary) hypertension: Secondary | ICD-10-CM

## 2018-07-12 DIAGNOSIS — Z8249 Family history of ischemic heart disease and other diseases of the circulatory system: Secondary | ICD-10-CM | POA: Insufficient documentation

## 2018-07-12 DIAGNOSIS — E559 Vitamin D deficiency, unspecified: Secondary | ICD-10-CM | POA: Insufficient documentation

## 2018-07-12 DIAGNOSIS — H8110 Benign paroxysmal vertigo, unspecified ear: Secondary | ICD-10-CM | POA: Insufficient documentation

## 2018-07-12 DIAGNOSIS — K429 Umbilical hernia without obstruction or gangrene: Secondary | ICD-10-CM

## 2018-07-12 DIAGNOSIS — E1165 Type 2 diabetes mellitus with hyperglycemia: Secondary | ICD-10-CM | POA: Insufficient documentation

## 2018-07-12 DIAGNOSIS — IMO0001 Reserved for inherently not codable concepts without codable children: Secondary | ICD-10-CM

## 2018-07-12 DIAGNOSIS — Z7982 Long term (current) use of aspirin: Secondary | ICD-10-CM | POA: Insufficient documentation

## 2018-07-12 DIAGNOSIS — Z833 Family history of diabetes mellitus: Secondary | ICD-10-CM | POA: Insufficient documentation

## 2018-07-12 LAB — POCT GLYCOSYLATED HEMOGLOBIN (HGB A1C): HbA1c, POC (controlled diabetic range): 8.5 % — AB (ref 0.0–7.0)

## 2018-07-12 LAB — GLUCOSE, POCT (MANUAL RESULT ENTRY): POC GLUCOSE: 248 mg/dL — AB (ref 70–99)

## 2018-07-12 MED ORDER — METFORMIN HCL 1000 MG PO TABS: 500.0000 mg | ORAL_TABLET | Freq: Two times a day (BID) | ORAL | 5 refills | Status: DC

## 2018-07-12 MED FILL — ?METFORMIN HCL 1,000 MG TAB: 1000 | 30 days supply | Qty: 30 | Fill #0

## 2018-07-12 NOTE — Telephone Encounter (Signed)
Spoke with Pt an schedule an appt with financial

## 2018-07-12 NOTE — Progress Notes (Signed)
Pt states her her big toes changes color

## 2018-07-12 NOTE — Progress Notes (Signed)
Patient ID: Connie Russell, female    DOB: 1971/05/09  MRN: [id number]  CC: Diabetes; Hypertension; and Referral (hernia)   Subjective: Connie Russell is a 47 y.o. female who presents for chronic ds management.  Her 58 year old daughter is with her. Her concerns today include:  Patient with history of diabetes type 2, HTN, constipation, vitamin D deficiency.  DIABETES TYPE 2 Last A1C:   Results for orders placed or performed in visit on 07/12/18  POCT glucose (manual entry)  Result Value Ref Range   POC Glucose 248 (A) 70 - 99 mg/dl  POCT glycosylated hemoglobin (Hb A1C)  Result Value Ref Range   Hemoglobin A1C     HbA1c POC (<> result, manual entry)     HbA1c, POC (prediabetic range)     HbA1c, POC (controlled diabetic range) 8.5 (A) 0.0 - 7.0 %    Med Adherence:  []  Yes    [x]  No, she has been taking the Metformin 500 mg once a day instead of BID as prescribed.  Medication side effects:  []  Yes    [x]  No Home Monitoring?  []  Yes    [x]  No, ran out of stripes for meter.    Diet Adherence: [x]  Yes.  "I'm always careful with the food."    []  No Exercise: []  Yes    [x]  No "not in this weather." Hypoglycemic episodes?: []  Yes    [x]  No Numbness of the feet? []  Yes    [x]  No Retinopathy hx? []  Yes    [x]  No Last eye exam:  Over due but not able to afford Comments:   HYPERTENSION Currently taking: see medication list Med Adherence: [x]  Yes    []  No Medication side effects: []  Yes    [x]  No Adherence with salt restriction: [x]  Yes    []  No Home Monitoring?: []  Yes    [x]  No.  No device to check SOB? []  Yes    [x]  No Chest Pain?: []  Yes    [x]  No Leg swelling?: []  Yes    [x]  No Headaches?: []  Yes    [x]  No Dizziness? []  Yes    [x]  No Comments:   C/o intermittent pain around the umbilicus  Has umbilical hernia x 1-2 yrs It has increased in size.  Pain when she bends over.  Pain sometimes when she eats.  No associated nausea or vomiting.  She is moving her  bowels normally.  HM: Had pap smear 01/2018 at [facility] and reports being told it was okay  Patient Active Problem List   Diagnosis Date Noted  . Benign paroxysmal positional vertigo 06/04/2017  . Diabetes mellitus type 2, controlled (HCC) 03/18/2016  . Gastritis 09/16/2015  . Dyspepsia 06/13/2015  . Constipation 01/15/2014  . GERD (gastroesophageal reflux disease) 01/15/2014  . Hypertension, essential 08/14/2013  . Other and unspecified hyperlipidemia 08/14/2013  . Preventative health care 07/14/2013     Current Outpatient Medications on File Prior to Visit  Medication Sig Dispense Refill  . amLODipine (NORVASC) 10 MG tablet Take 1 tablet (10 mg total) by mouth daily. 60 tablet 6  . aspirin EC 81 MG tablet Take 1 tablet (81 mg total) by mouth daily. 30 tablet 11  . naproxen (NAPROSYN) 500 MG tablet Take 1 tablet (500 mg total) by mouth 2 (two) times daily as needed for mild pain. 15 tablet 0  . omeprazole (PRILOSEC) 20 MG capsule Take 1 capsule (20 mg total) by mouth daily. (Patient not taking: Reported on  04/14/2018) 30 capsule 3  . Vitamin D, Cholecalciferol, 400 units CAPS Take 400 Units by mouth every morning. 90 capsule 0   No current facility-administered medications on file prior to visit.     Allergies  Allergen Reactions  . Linzess [Linaclotide]     DIARRHEA AT 145 MICROG    Social History   Socioeconomic History  . Marital status: Married    Spouse name: Not on file  . Number of children: Not on file  . Years of education: Not on file  . Highest education level: Not on file  Occupational History  . Not on file  Social Needs  . Financial resource strain: Not on file  . Food insecurity:    Worry: Not on file    Inability: Not on file  . Transportation needs:    Medical: Not on file    Non-medical: Not on file  Tobacco Use  . Smoking status: Never Smoker  . Smokeless tobacco: Never Used  . Tobacco comment: Never smoked  Substance and Sexual Activity  . Alcohol  use: No    Alcohol/week: 0.0 standard drinks  . Drug use: No  . Sexual activity: Yes    Birth control/protection: Pill, None  Lifestyle  . Physical activity:    Days per week: Not on file    Minutes per session: Not on file  . Stress: Not on file  Relationships  . Social connections:    Talks on phone: Not on file    Gets together: Not on file    Attends religious service: Not on file    Active member of club or organization: Not on file    Attends meetings of clubs or organizations: Not on file    Relationship status: Not on file  . Intimate partner violence:    Fear of current or ex partner: Not on file    Emotionally abused: Not on file    Physically abused: Not on file    Forced sexual activity: Not on file  Other Topics Concern  . Not on file  Social History Narrative   ** Merged History Encounter **       [place].    Family History  Problem Relation Age of Onset  . Hypertension Mother   . Diabetes Maternal Grandmother   . Colon cancer Neg Hx     Past Surgical History:  Procedure Laterality Date  . ESOPHAGOGASTRODUODENOSCOPY N/A 06/24/2015   SLF: 1. Abdominal pain most likely due to constipation, GERD, and Gastritis. 2. MIld NON-erosive gastritis.  Marland Kitchen. FOOT SURGERY Left 2000   Local    ROS: Review of Systems Negative except as above PHYSICAL EXAM: BP 125/81   Pulse 82   Temp 97.7 F (36.5 C) (Oral)   Resp 16   Wt 144 lb 12.8 oz (65.7 kg)   SpO2 99%   BMI 24.85 kg/m   Wt Readings from Last 3 Encounters:  07/12/18 144 lb 12.8 oz (65.7 kg)  04/14/18 145 lb 9.6 oz (66 kg)  03/10/18 146 lb 6.4 oz (66.4 kg)   Physical Exam  General appearance - alert, well appearing, and in no distress Mental status - normal mood, behavior, speech, dress, motor activity, and thought processes Mouth - mucous membranes moist, pharynx normal without lesions Neck - supple, no significant adenopathy Chest - clear to auscultation, no wheezes, rales or  rhonchi, symmetric air entry Heart - normal rate, regular rhythm, normal S1, S2, no murmurs, rubs, clicks or  gallops Abdomen -normal bowel sounds, nondistended, no organomegaly.  Small compressible periumbilical hernia above the umbilicus.  It is nontender to touch. Extremities - peripheral pulses normal, no pedal edema, no clubbing or cyanosis  Results for orders placed or performed in visit on 07/12/18  POCT glucose (manual entry)  Result Value Ref Range   POC Glucose 248 (A) 70 - 99 mg/dl  POCT glycosylated hemoglobin (Hb A1C)  Result Value Ref Range   Hemoglobin A1C     HbA1c POC (<> result, manual entry)     HbA1c, POC (prediabetic range)     HbA1c, POC (controlled diabetic range) 8.5 (A) 0.0 - 7.0 %   Lab Results  Component Value Date   CHOL 143 09/23/2017   HDL 38 (L) 09/23/2017   LDLCALC 78 09/23/2017   TRIG 137 09/23/2017   CHOLHDL 3.8 09/23/2017     Chemistry      Component Value Date/Time   NA 134 03/10/2018 1044   K 4.1 03/10/2018 1044   CL 98 03/10/2018 1044   CO2 23 03/10/2018 1044   BUN 13 03/10/2018 1044   CREATININE 0.65 03/10/2018 1044   CREATININE 0.61 03/18/2016 1244      Component Value Date/Time   CALCIUM 9.4 03/10/2018 1044   ALKPHOS 90 09/23/2017 1215   AST 16 09/23/2017 1215   ALT 24 09/23/2017 1215   BILITOT <0.2 09/23/2017 1215      ASSESSMENT AND PLAN:  1. Uncontrolled type 2 diabetes mellitus without complication, without long-term current use of insulin (HCC) Advised patient to take the metformin twice a day as prescribed. Encourage regular aerobic exercise of at least 150 minutes/week.  Discussed ways she can exercise indoors at home. Encourage healthy eating habits. - POCT glucose (manual entry) - POCT glycosylated hemoglobin (Hb A1C)  2. Essential hypertension Close to goal of 130/80 or lower.  Continue amlodipine  3. Umbilical hernia without obstruction and without gangrene Advised patient to apply for the orange card/cone  discount card.  Once she is approved we can refer her to a general surgeon for evaluation.  In the meantime I went over signs and symptoms that would suggest a strangulated hernia and the need for urgent evaluation.  Recommend that she avoids heavy lifting.   Patient was given the opportunity to ask questions.  Patient verbalized understanding of the plan and was able to repeat key elements of the plan.  [employer] interpreter used during this encounter. # [id number]   Orders Placed This Encounter  Procedures  . POCT glucose (manual entry)  . POCT glycosylated hemoglobin (Hb A1C)     Requested Prescriptions   Signed Prescriptions Disp Refills  . metFORMIN (GLUCOPHAGE) 1000 MG tablet 60 tablet 5    Sig: Take 0.5 tablets (500 mg total) by mouth 2 (two) times daily with a meal.    Return in about 3 months (around 10/11/2018).  Jonah Blue, MD, FACP

## 2018-07-12 NOTE — Patient Instructions (Signed)
Hernia umbilical en los adultos (Umbilical Hernia, Adult) Una hernia es una protrusin de tejido que sobresale a travs de una abertura entre los msculos. Una hernia umbilical se produce en el abdomen, cerca del ombligo. La hernia puede contener tejidos del intestino delgado, el intestino grueso o tejido graso que recubre el intestino (epipln). Las hernias umbilicales en los adultos suelen empeorar con el tiempo y requieren tratamiento quirrgico. Hay varios tipos de hernias umbilicales. Puede tener:  Una hernia ubicada justo por debajo o por arriba del ombligo (hernia inguinal indirecta). Es el tipo de hernia umbilical ms frecuente en los adultos.  Una hernia que se forma a travs de una abertura hecha por el ombligo (hernia inguinal directa).  Una hernia que aparece y desaparece (hernia reducible). Una hernia reducible puede ser visible solo al hacer fuerza, levantar objetos pesados o toser. Este tipo de hernia se puede reintroducir en el abdomen (reducir).  Una hernia que aprisiona tejido abdominal (hernia encarcelada). Este tipo de hernia es irreducible.  Una hernia que interrumpe el flujo de sangre a los tejidos en su interior (hernia estrangulada). Si esto ocurre, los tejidos pueden empezar a morir. Este tipo de hernia requiere tratamiento de emergencia. CAUSAS Una hernia umbilical se produce cuando el tejido dentro del abdomen ejerce presin sobre una zona debilitada de los msculos abdominales. FACTORES DE RIESGO Puede correr un mayor riesgo de tener esta afeccin en los siguientes casos:  Tiene obesidad.  Tuvo varios embarazos.  Tiene una acumulacin de lquido dentro del abdomen (ascitis).  Se someti a una ciruga que debilita los msculos abdominales. SNTOMAS El principal sntoma de esta afeccin es un bulto en el ombligo o cerca de este que no causa dolor. Una hernia reducible puede ser visible solo al hacer fuerza, levantar objetos pesados o toser. Otros sntomas pueden  incluir los siguientes:  Dolor sordo.  Sensacin de opresin. Los sntomas de una hernia estrangulada pueden incluir los siguientes:  Dolor que se vuelve cada vez ms intenso.  Nuseas y vmitos.  Dolor al ejercer presin sobre la hernia.  Cambio de color de la piel que recubre la hernia que se torna roja o violcea.  Estreimiento.  Sangre en la materia fecal (heces). DIAGNSTICO Esta afeccin se puede diagnosticar en funcin de lo siguiente:  Un examen fsico. Pueden pedirle que tosa o que haga fuerza mientras est de pie. Estas acciones aumentan la presin dentro del abdomen y empujan la hernia a travs de la abertura en los msculos. El mdico puede ejercer presin sobre la hernia para tratar de reducirla.  Los sntomas y antecedentes mdicos. TRATAMIENTO La ciruga es el nico tratamiento para una hernia umbilical. En el caso de que la hernia est estrangulada, esta se realiza lo antes posible. Si tiene una hernia pequea que no est encarcelada, tal vez tenga que bajar de peso antes de la ciruga. INSTRUCCIONES PARA EL CUIDADO EN EL HOGAR  Baje de peso, si se lo indic el mdico.  No trate de reintroducir la hernia.  Observe si la hernia cambia de color o de tamao. Infrmele al mdico si se producen cambios.  Tal vez deba evitar las actividades que aumentan la presin sobre la hernia.  No levante objetos que pesen ms de 10libras (4,5kg) hasta que el mdico le diga que es seguro.  Tome los medicamentos de venta libre y los recetados solamente como se lo haya indicado el mdico.  Concurra a todas las visitas de control como se lo haya indicado el mdico. Esto es   importante. SOLICITE ATENCIN MDICA SI:  La hernia se agranda.  La hernia le causa dolor. SOLICITE ATENCIN MDICA DE INMEDIATO SI:  Tiene un dolor intenso y repentino cerca de la zona de la hernia.  Tiene dolor, as como nuseas o vmitos.  Tiene dolor y la piel que recubre la hernia cambia de  color.  Tiene fiebre. Esta informacin no tiene como fin reemplazar el consejo del mdico. Asegrese de hacerle al mdico cualquier pregunta que tenga. Document Released: 04/02/2016 Document Revised: 04/02/2016 Document Reviewed: 12/27/2015 Elsevier Interactive Patient Education  2018 [employer]vier Inc.  

## 2018-07-12 NOTE — Telephone Encounter (Signed)
Patient would like to check on the status of her CAFA. She does not remember if she renewed her cafa or not. Please follow up.

## 2018-07-19 MED FILL — AMLODIPINE BESYLATE 10 MG T: 10 | 30 days supply | Qty: 30 | Fill #6

## 2018-07-25 ENCOUNTER — Ambulatory Visit: Payer: Self-pay | Attending: Family Medicine

## 2018-08-05 MED FILL — ?METFORMIN HCL 1,000 MG TAB: 1000 | 30 days supply | Qty: 30 | Fill #1

## 2018-08-05 MED FILL — AMLODIPINE BESYLATE 10 MG T: 10 | 30 days supply | Qty: 30 | Fill #7

## 2018-08-26 MED FILL — ?METFORMIN HCL 1000MG TABS: 1000 | 30 days supply | Qty: 30 | Fill #2

## 2018-09-13 ENCOUNTER — Ambulatory Visit: Payer: Self-pay | Attending: Internal Medicine

## 2018-09-13 ENCOUNTER — Other Ambulatory Visit: Payer: Self-pay | Admitting: Pharmacist

## 2018-09-13 MED ORDER — METFORMIN HCL 1000 MG PO TABS: 500.0000 mg | ORAL_TABLET | Freq: Two times a day (BID) | ORAL | 2 refills | Status: DC

## 2018-09-13 MED FILL — ?METFORMIN HCL 1000MG TABS: 1000 | 30 days supply | Qty: 30 | Fill #3

## 2018-09-13 MED FILL — AMLODIPINE BESYLATE 10 MG T: 10 | 30 days supply | Qty: 30 | Fill #8

## 2018-10-14 ENCOUNTER — Other Ambulatory Visit: Payer: Self-pay | Admitting: Internal Medicine

## 2018-10-14 DIAGNOSIS — I10 Essential (primary) hypertension: Secondary | ICD-10-CM

## 2018-10-14 MED FILL — AMLODIPINE BESYLATE 10 MG T: 10 | 30 days supply | Qty: 30 | Fill #0

## 2018-11-02 MED FILL — ?METFORMIN HCL 1000 MG TAB: 1000 | 30 days supply | Qty: 30 | Fill #4

## 2018-11-02 MED FILL — AMLODIPINE BESYLATE 10 MG T: 10 | 30 days supply | Qty: 30 | Fill #1

## 2018-12-13 ENCOUNTER — Other Ambulatory Visit: Payer: Self-pay | Admitting: Internal Medicine

## 2018-12-13 DIAGNOSIS — I10 Essential (primary) hypertension: Secondary | ICD-10-CM

## 2018-12-13 MED FILL — ?AMLODIPINE BESYLATE 10 MG: 10 | 30 days supply | Qty: 30 | Fill #0

## 2018-12-20 ENCOUNTER — Other Ambulatory Visit: Payer: Self-pay | Admitting: Internal Medicine

## 2018-12-20 DIAGNOSIS — I10 Essential (primary) hypertension: Secondary | ICD-10-CM

## 2018-12-21 ENCOUNTER — Other Ambulatory Visit: Payer: Self-pay | Admitting: Internal Medicine

## 2018-12-21 DIAGNOSIS — I10 Essential (primary) hypertension: Secondary | ICD-10-CM

## 2019-01-02 IMAGING — MG DIGITAL SCREENING BILATERAL MAMMOGRAM WITH TOMO AND CAD
8 series · 9 of 24 positions shown · non-contrast
Comparison: Previous exam(s).

CLINICAL DATA: Screening.

EXAM:
DIGITAL SCREENING BILATERAL MAMMOGRAM WITH TOMO AND CAD

[R MLO synth-2D]
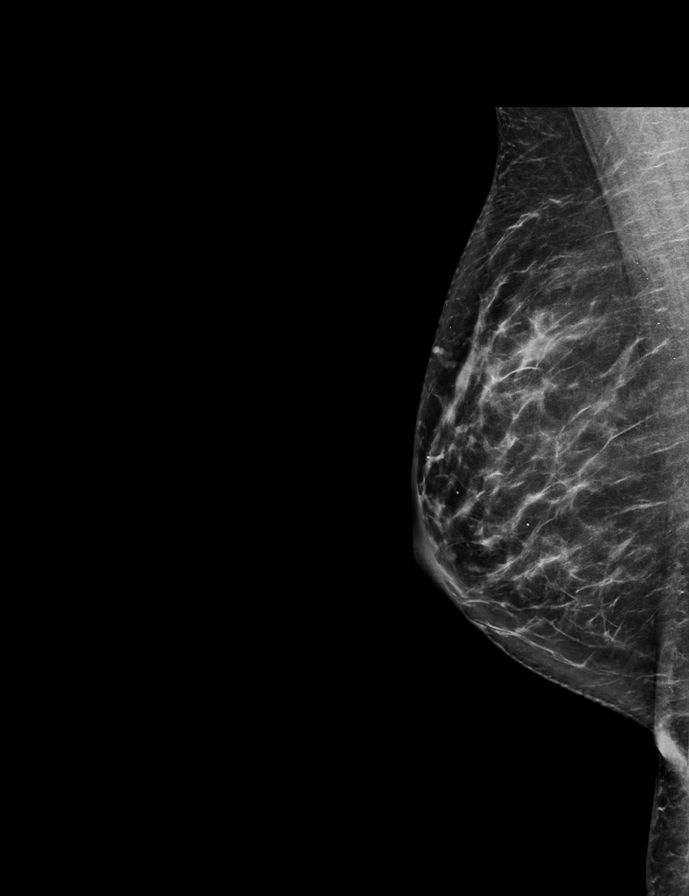

[L MLO synth-2D]
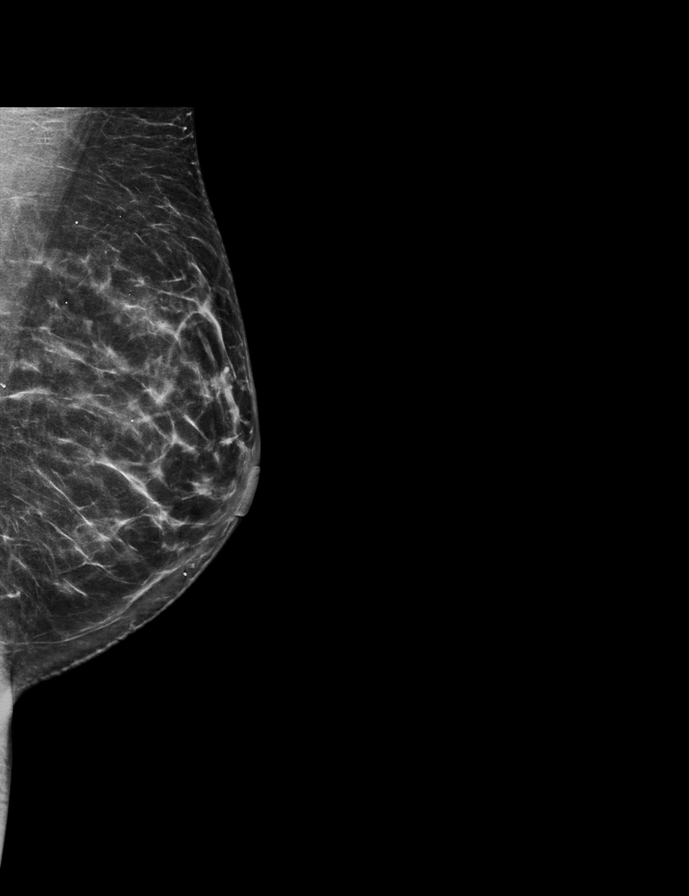

[R CC synth-2D]
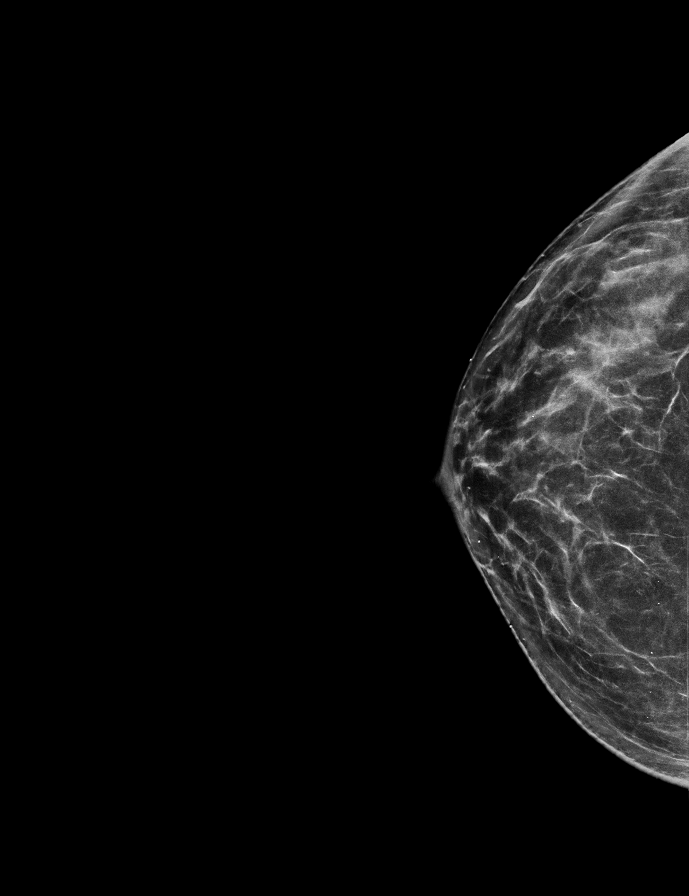

[L CC synth-2D]
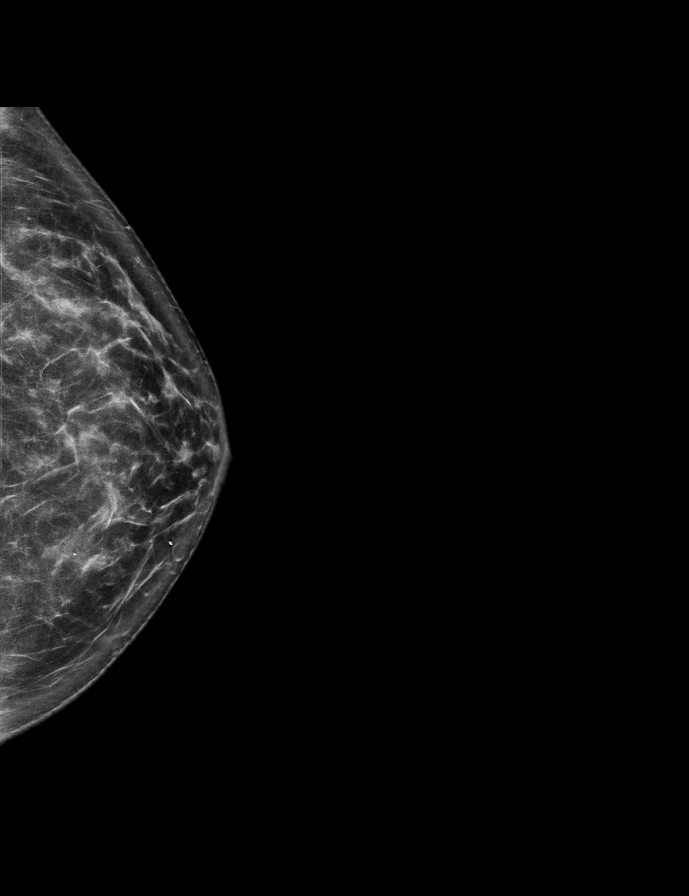

[L CC tomo · 2 of 70 frames shown]
[frame 23/70]
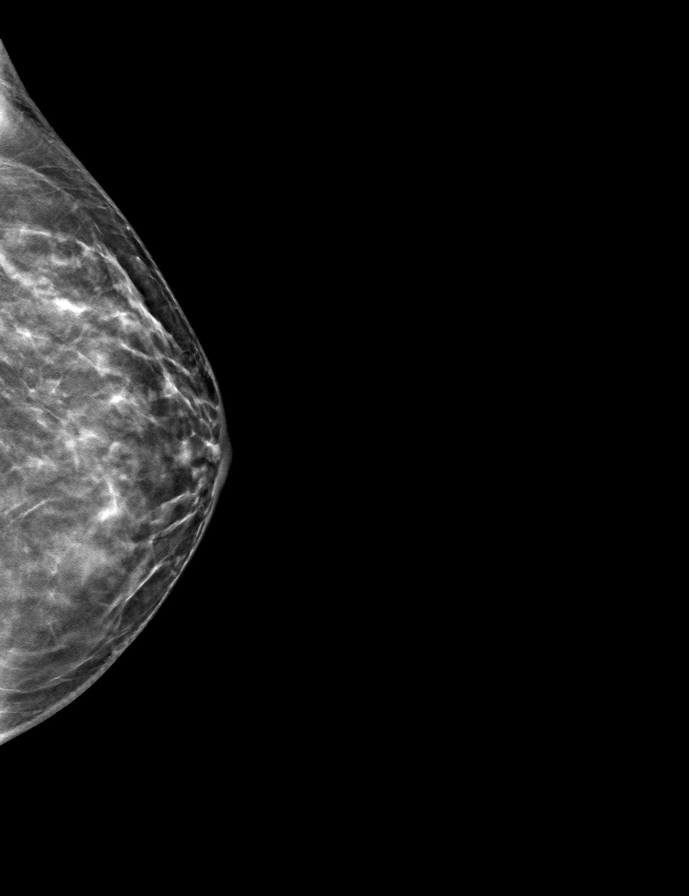
[frame 35/70]
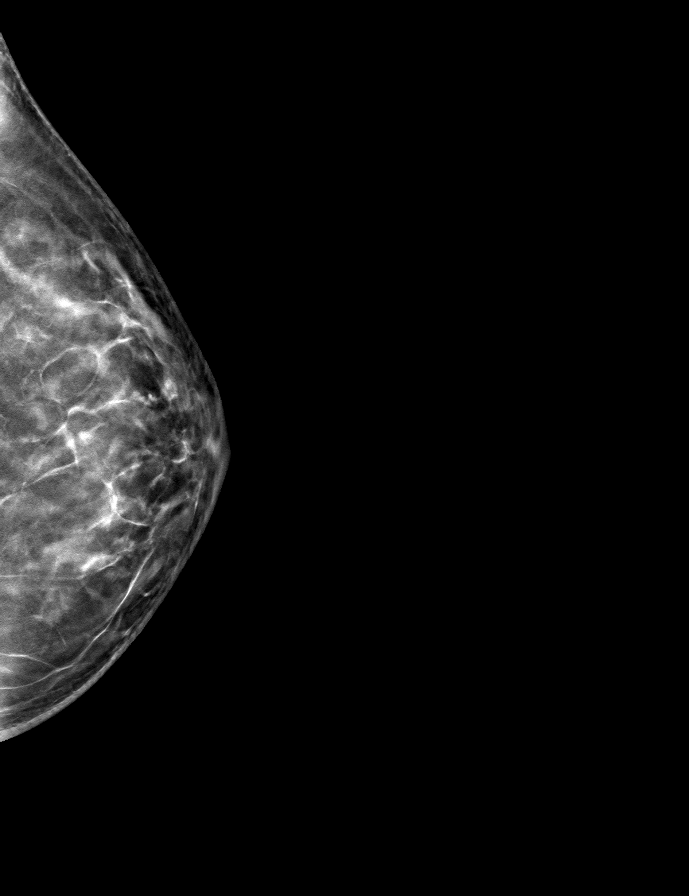

[R CC tomo · tomo slice 33/65.0]
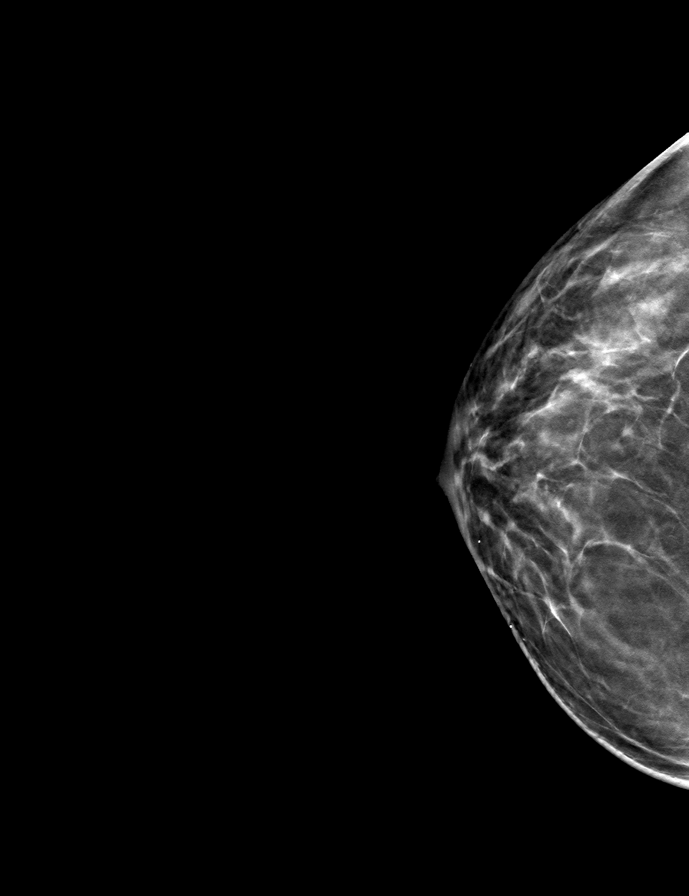

[L MLO tomo · tomo slice 35/69.0]
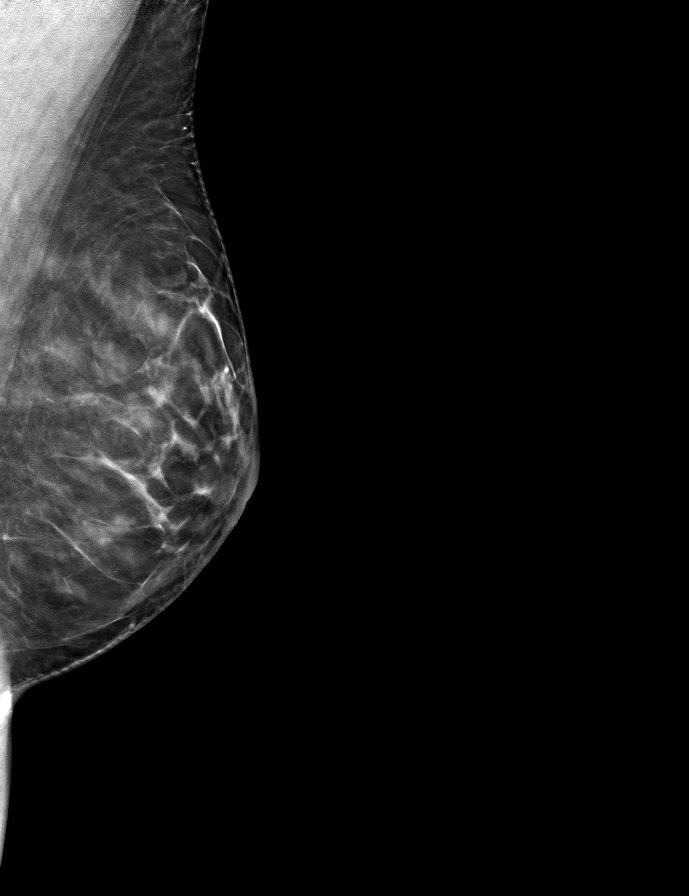

[R MLO tomo · tomo slice 35/68.0]
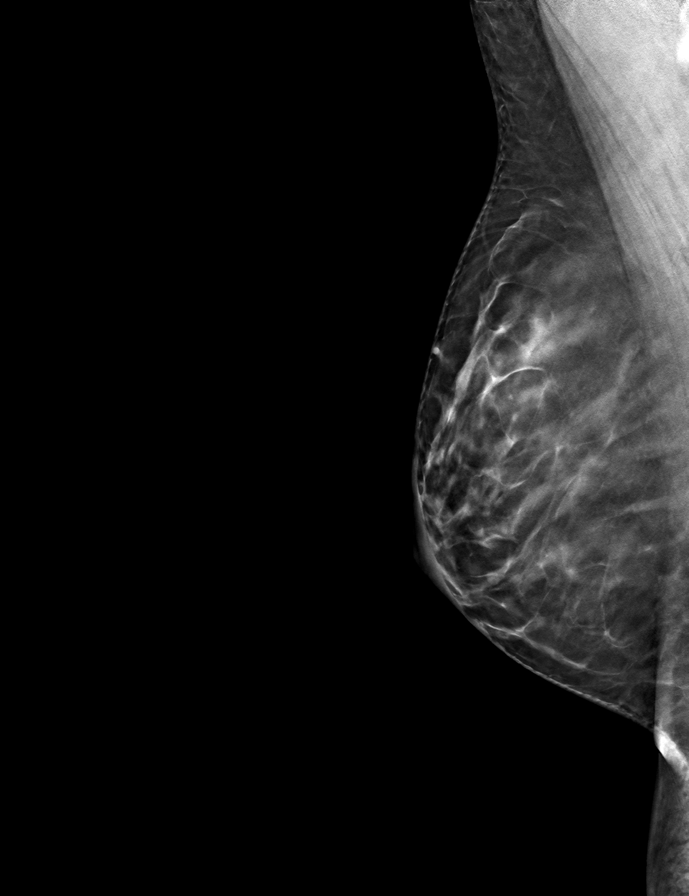

[9 of 24 positions shown; findings below may reference images not displayed]

ACR Breast Density Category b: There are scattered areas of
fibroglandular density.
FINDINGS: There are no findings suspicious for malignancy. Images were
processed with CAD.
IMPRESSION: No mammographic evidence of malignancy. A result letter of this
screening mammogram will be mailed directly to the patient.

RECOMMENDATION:
Screening mammogram in one year. (Code:[id number])

BI-RADS CATEGORY  1: Negative.

## 2019-01-05 ENCOUNTER — Telehealth: Payer: Self-pay | Admitting: Internal Medicine

## 2019-01-05 NOTE — Telephone Encounter (Signed)
Patient called stating she believes the amlodipine is making her feel bad she said that her previous BP medicine didn't make her feel this way. Please follow up.

## 2019-01-05 NOTE — Telephone Encounter (Signed)
Patients call taken.  Patient identified by name and date of birth.  Patient states that since she has had a blood pressure medication change she has been having side effects.  Patient states that she is having headaches, she feels her heart rate is elevated, she feels sometimes faint, doesn't feel like getting out of bed.    Patient states that if she breaks off a little piece of the pill she doesn't have any symptoms.  Patient has not taken her blood pressure.    Patient advised to stop medications until a return call is made to her advising how to proceed.  Patient acknowledged understanding of advice.

## 2019-01-05 NOTE — Telephone Encounter (Signed)
Patients call returned.  Patient identified by name and date of birth.  Per PCP patient was made an appointment for medicine management.  Patient acknowledged understanding of advice and appointment.

## 2019-01-11 MED FILL — ?METFORMIN HCL 1000 MG TAB: 1000 | 30 days supply | Qty: 30 | Fill #5

## 2019-01-12 ENCOUNTER — Ambulatory Visit: Payer: Self-pay | Attending: Internal Medicine | Admitting: Internal Medicine

## 2019-01-12 ENCOUNTER — Other Ambulatory Visit: Payer: Self-pay

## 2019-01-12 DIAGNOSIS — E1165 Type 2 diabetes mellitus with hyperglycemia: Secondary | ICD-10-CM

## 2019-01-12 DIAGNOSIS — IMO0001 Reserved for inherently not codable concepts without codable children: Secondary | ICD-10-CM

## 2019-01-12 DIAGNOSIS — R05 Cough: Secondary | ICD-10-CM

## 2019-01-12 DIAGNOSIS — I10 Essential (primary) hypertension: Secondary | ICD-10-CM

## 2019-01-12 DIAGNOSIS — R059 Cough, unspecified: Secondary | ICD-10-CM

## 2019-01-12 MED ORDER — TRUEPLUS LANCETS 28G MISC: 6 refills | Status: AC

## 2019-01-12 MED ORDER — GLUCOSE BLOOD VI STRP: ORAL_STRIP | 12 refills | Status: DC

## 2019-01-12 MED ORDER — TRUE METRIX METER W/DEVICE KIT: PACK | 0 refills | Status: AC

## 2019-01-12 MED ORDER — LOSARTAN POTASSIUM 25 MG PO TABS: 25.0000 mg | ORAL_TABLET | Freq: Every day | ORAL | 4 refills | Status: DC

## 2019-01-12 NOTE — Progress Notes (Signed)
Virtual Visit via Telephone Note Due to current restrictions/limitations of in-office visits due to the COVID-19 pandemic, this scheduled clinical appointment was converted to a telehealth visit  I connected with Connie Russell on 01/12/19 at 4:16 p.m EDT by telephone and verified that I am speaking with the correct person using two identifiers. I am in my office.  The patient is at home.  Only the patient, myself and Vaughan Basta from [employer] [phone]) participated in this encounter.   I discussed the limitations, risks, security and privacy concerns of performing an evaluation and management service by telephone and the availability of in person appointments. I also discussed with the patient that there may be a patient responsible charge related to this service. The patient expressed understanding and agreed to proceed.   History of Present Illness: Patient with history of diabetes type 2, HTN, constipation, vitamin D deficiency.  Patient was last seen 07/2018.  HTN:  Pt called last wk stating that she was having problems with the Amlodipine.  She states the pill that she use to get from our pharmacy was small but the pills that she received when she RF 2 wks ago was larger  -when she took the med, she felt CP, SOB, weak and dizzy when she took the medication.  She started taking just a 1/2 a pill but even so she felt bad just not as bad as when she took the whole pill.  She is having a difficult time having to adjust to wearing facial mask because of the COVID-19 pandemic.  Feels short of breath at times when she has to wear facial mask.  When she takes off the mask she finds that she has a cough several seconds.  She wants to know whether this is normal -no device to check BP  DM:  No device to check BS but would like device Reports compliance with Metformin 1 gram  1/2 tab BID Walks daily for 20 mins Does okay with eating habits    Observations/Objective: No direct  observation done as this was a telephone encounter  Assessment and Plan: 1. Essential hypertension It sounds as though she got amlodipine the same dose but from a different manufacturer.  Since she is intolerant of it, I will have her stop the amlodipine and changed to Cozaar instead. - losartan (COZAAR) 25 MG tablet; Take 1 tablet (25 mg total) by mouth daily.  Dispense: 30 tablet; Refill: 4  2. Uncontrolled type 2 diabetes mellitus without complication, without long-term current use of insulin (HCC) Level of control unknown.  I have sent a prescription to the pharmacy for her to get diabetic testing supplies.  She will come to the lab to have blood test done including an A1c - Blood Glucose Monitoring Suppl (TRUE METRIX METER) w/Device KIT; Use as directed  Dispense: 1 kit; Refill: 0 - glucose blood (TRUE METRIX BLOOD GLUCOSE TEST) test strip; Use as instructed  Dispense: 100 each; Refill: 12 - TRUEplus Lancets 28G MISC; Use as directed  Dispense: 100 each; Refill: 6 - CBC - Hemoglobin A1c; Future - Comprehensive metabolic panel; Future - Lipid panel; Future  3. Cough in adult Encourage her to wear a mask that is comfortable for her even if it is a homemade mask.  He is concerned about the cough that she has when she puts on the mask.  I will have her come for an in person evaluation   Follow Up Instructions: F/u 1-2 wks in person   I  discussed the assessment and treatment plan with the patient. The patient was provided an opportunity to ask questions and all were answered. The patient agreed with the plan and demonstrated an understanding of the instructions.   The patient was advised to call back or seek an in-person evaluation if the symptoms worsen or if the condition fails to improve as anticipated.  I provided 28 minutes of non-face-to-face time during this encounter.   Karle Plumber, MD

## 2019-01-13 MED FILL — !TRUE METRIX BLOOD GLUCOSE: 1 days supply | Qty: 1 | Fill #0

## 2019-01-13 MED FILL — LOSARTAN POTASSIUM 25 MG TA: 25 | 30 days supply | Qty: 30 | Fill #0

## 2019-01-13 MED FILL — TRUEplus LANCETS 28G MISC: 30 days supply | Qty: 100 | Fill #0

## 2019-01-13 MED FILL — TRUE METRIX TEST STRIP: 30 days supply | Qty: 100 | Fill #0

## 2019-02-02 ENCOUNTER — Other Ambulatory Visit: Payer: Self-pay

## 2019-02-02 ENCOUNTER — Ambulatory Visit: Payer: Self-pay | Attending: Internal Medicine

## 2019-02-02 DIAGNOSIS — IMO0001 Reserved for inherently not codable concepts without codable children: Secondary | ICD-10-CM

## 2019-02-03 ENCOUNTER — Other Ambulatory Visit: Payer: Self-pay | Admitting: Internal Medicine

## 2019-02-03 LAB — COMPREHENSIVE METABOLIC PANEL
ALT: 24 IU/L (ref 0–32)
AST: 22 IU/L (ref 0–40)
Albumin/Globulin Ratio: 1.9 (ref 1.2–2.2)
Albumin: 4.5 g/dL (ref 3.8–4.8)
Alkaline Phosphatase: 61 IU/L (ref 39–117)
BUN/Creatinine Ratio: 16 (ref 9–23)
BUN: 11 mg/dL (ref 6–24)
Bilirubin Total: 0.5 mg/dL (ref 0.0–1.2)
CO2: 23 mmol/L (ref 20–29)
Calcium: 9.8 mg/dL (ref 8.7–10.2)
Chloride: 101 mmol/L (ref 96–106)
Creatinine, Ser: 0.68 mg/dL (ref 0.57–1.00)
GFR calc Af Amer: 121 mL/min/{1.73_m2} (ref >59)
GFR calc non Af Amer: 105 mL/min/{1.73_m2} (ref >59)
Globulin, Total: 2.4 g/dL (ref 1.5–4.5)
Glucose: 148 mg/dL — ABNORMAL HIGH (ref 65–99)
Potassium: 4.7 mmol/L (ref 3.5–5.2)
Sodium: 140 mmol/L (ref 134–144)
Total Protein: 6.9 g/dL (ref 6.0–8.5)

## 2019-02-03 LAB — LIPID PANEL
Chol/HDL Ratio: 4.5 ratio — ABNORMAL HIGH (ref 0.0–4.4)
Cholesterol, Total: 206 mg/dL — ABNORMAL HIGH (ref 100–199)
HDL: 46 mg/dL (ref >39)
LDL Calculated: 111 mg/dL — ABNORMAL HIGH (ref 0–99)
Triglycerides: 246 mg/dL — ABNORMAL HIGH (ref 0–149)
VLDL Cholesterol Cal: 49 mg/dL — ABNORMAL HIGH (ref 5–40)

## 2019-02-03 LAB — HEMOGLOBIN A1C
Est. average glucose Bld gHb Est-mCnc: 171 mg/dL
Hgb A1c MFr Bld: 7.6 % — ABNORMAL HIGH (ref 4.8–5.6)

## 2019-02-03 MED ORDER — METFORMIN HCL 1000 MG PO TABS: 1000.0000 mg | ORAL_TABLET | Freq: Two times a day (BID) | ORAL | 6 refills | Status: DC

## 2019-02-03 MED ORDER — ATORVASTATIN CALCIUM 10 MG PO TABS: 10.0000 mg | ORAL_TABLET | Freq: Every day | ORAL | 3 refills | Status: DC

## 2019-02-03 MED FILL — metFORMIN HCL 1000 MG TABS: 1000 | 30 days supply | Qty: 60 | Fill #0

## 2019-02-03 MED FILL — ATORVASTATIN 10 MG TABLET: 10 | 30 days supply | Qty: 30 | Fill #0

## 2019-02-13 ENCOUNTER — Ambulatory Visit: Payer: No Typology Code available for payment source

## 2019-02-13 ENCOUNTER — Telehealth: Payer: Self-pay | Admitting: *Deleted

## 2019-02-13 MED ORDER — SITAGLIPTIN PHOSPHATE 50 MG PO TABS: 50.0000 mg | ORAL_TABLET | Freq: Every day | ORAL | 2 refills | Status: DC

## 2019-02-13 MED FILL — LOSARTAN POTASSIUM 25 MG TA: 25 | 30 days supply | Qty: 30 | Fill #1

## 2019-02-13 NOTE — Telephone Encounter (Signed)
-----   Message from Ladell Pier, MD sent at 02/03/2019  4:01 PM EDT ----- Let pt know that her LD cholesterol is elev at 111 with goal being less than 70 in diabetics. Recommend starting a med called Lipitor to help lower cholesterol.  Rxn sent to our pharmacy.  Kidney and liver function tests are nl.  A1C is 7.6 with goal being less than 7.  I recommend increasing Metformin from 1000 mg 1/2 tab twice a day to one full tablet twice a day.  New rxn sent to pharmacy.

## 2019-02-13 NOTE — Telephone Encounter (Signed)
Patient verified DOB Patient is aware of needing Lipitor for cholesterol not being at goal. Patient is also aware of needing to increase metformin to 1 full tablet twice a day. Medications are at the [facility] pharmacy. Patient complains of having gastritis and the metformin causes flare ups.

## 2019-02-14 MED FILL — !JANUVIA 25 MG TABLET: 25 | 30 days supply | Qty: 60 | Fill #0

## 2019-02-14 NOTE — Telephone Encounter (Signed)
Patient is aware of and has picked up [place].

## 2019-02-21 ENCOUNTER — Ambulatory Visit: Payer: No Typology Code available for payment source | Attending: Internal Medicine | Admitting: Internal Medicine

## 2019-02-21 ENCOUNTER — Other Ambulatory Visit: Payer: Self-pay

## 2019-02-27 ENCOUNTER — Other Ambulatory Visit: Payer: Self-pay

## 2019-02-27 ENCOUNTER — Ambulatory Visit: Payer: Self-pay | Attending: Internal Medicine

## 2019-03-09 MED FILL — LOSARTAN POTASSIUM 25 MG TA: 25 | 30 days supply | Qty: 30 | Fill #2

## 2019-03-15 MED FILL — metFORMIN HCL 1000 MG TABS: 1000 | 30 days supply | Qty: 60 | Fill #1

## 2019-03-15 MED FILL — !JANUVIA 25 MG TABLET: 25 | 30 days supply | Qty: 60 | Fill #1

## 2019-04-11 MED FILL — metFORMIN HCL 1000 MG TABS: 1000 | 30 days supply | Qty: 60 | Fill #2

## 2019-04-11 MED FILL — !JANUVIA 50 MG TABLET: 50 | 30 days supply | Qty: 30 | Fill #0

## 2019-04-11 MED FILL — LOSARTAN POTASSIUM 25 MG TA: 25 | 30 days supply | Qty: 30 | Fill #3

## 2019-04-12 ENCOUNTER — Other Ambulatory Visit: Payer: Self-pay

## 2019-04-12 ENCOUNTER — Telehealth: Payer: Self-pay | Admitting: Internal Medicine

## 2019-04-12 ENCOUNTER — Ambulatory Visit: Payer: Self-pay | Attending: Internal Medicine

## 2019-04-12 NOTE — Telephone Encounter (Signed)
I call pt to remind her that she has a TELE visit with financial at 3pm LVM that I will call her back at 3

## 2019-04-12 NOTE — Telephone Encounter (Signed)
I called Pt 3:11pm since she had an appt with financial at 3pm unable to reach her LVm that she need to call back an reschedule her appt also that her documents will be sent back via mail today, since we can not keep her papers here for her privacy and ours, please call back when she get the documents since she is missing some documents that she need to add

## 2019-04-12 NOTE — Telephone Encounter (Signed)
2nd VM, I called Pt since she has a 3pm tele visit with financial, LVM since she did answer, I will try again in 10 minutes if she does not answer again I will mail back all the documents she submitted with the application and she need to call to reschedule her appt and bring all the documents again

## 2019-04-19 ENCOUNTER — Telehealth: Payer: Self-pay | Admitting: *Deleted

## 2019-04-19 ENCOUNTER — Ambulatory Visit: Payer: Self-pay | Attending: Internal Medicine | Admitting: Physician Assistant

## 2019-04-19 DIAGNOSIS — E1165 Type 2 diabetes mellitus with hyperglycemia: Secondary | ICD-10-CM

## 2019-04-19 DIAGNOSIS — I10 Essential (primary) hypertension: Secondary | ICD-10-CM

## 2019-04-19 DIAGNOSIS — G43829 Menstrual migraine, not intractable, without status migrainosus: Secondary | ICD-10-CM

## 2019-04-19 DIAGNOSIS — R079 Chest pain, unspecified: Secondary | ICD-10-CM

## 2019-04-19 MED ORDER — IBUPROFEN 600 MG PO TABS: 600.0000 mg | ORAL_TABLET | Freq: Three times a day (TID) | ORAL | 0 refills | Status: DC

## 2019-04-19 MED ORDER — METHOCARBAMOL 500 MG PO TABS: 500.0000 mg | ORAL_TABLET | Freq: Three times a day (TID) | ORAL | 1 refills | Status: DC

## 2019-04-19 MED FILL — METHOCARBAMOL 500 MG TABS: 500 | 30 days supply | Qty: 90 | Fill #0

## 2019-04-19 MED FILL — IBUPROFEN 600 MG TABLET: 600 | 20 days supply | Qty: 60 | Fill #0

## 2019-04-19 NOTE — Telephone Encounter (Signed)
Work letter will be at front desk for patient to pick up tomorrow.

## 2019-04-19 NOTE — Progress Notes (Signed)
Virtual Visit via Telephone Note  I connected with Skylarr Liz Aguilar-Reyes on 04/19/19 at  3:30 PM EDT by telephone and verified that I am speaking with the correct person using two identifiers.   I discussed the limitations, risks, security and privacy concerns of performing an evaluation and management service by telephone and the availability of in person appointments. I also discussed with the patient that there may be a patient responsible charge related to this service. The patient expressed understanding and agreed to proceed.  Patient location:  home My Location:  [place] office Persons on the call: Edgar(interpreter), me and the patient  History of Present Illness: Headaches occur monthly just before her period starts.  Usually starts 2 days prior to menses.  LMP 04/02/2019.  HA start in the mornings and last into the afternoon.  Tylenol helps a little.  No vision changes.  This has been occurring for about 3 months.  On OCP.  She has been on the [facility] pills for about 1 year. HA is usu on 1 side or the other.    Blood sugars running around 140. Not checking BP.    Also c/o fleeting pain in chest for 2 months.  Lasts several seconds.  Worse with movement.  No radiating pain.  No SOB.  No diaphoresis.  Describes as sharp and in L side of her chest.    Works at [employer] and gets a cough when she has to go in extremely cold/freezer temperatures.     Observations/Objective:  A&Ox3. NAD   Assessment and Plan: 1. Menstrual migraine without status migrainosus, not intractable Reviewed Bloodwork from June 09/2018. Ibuprofen (ADVIL) 600 MG tablet; Take 1 tablet (600 mg total) by mouth 3 (three) times daily. For 3 days prior to period starting and prn pain  Dispense: 60 tablet; Refill: 0 - methocarbamol (ROBAXIN) 500 MG tablet; Take 1 tablet (500 mg total) by mouth 3 (three) times daily. 3 days prior to menstrual cycle and prn muscle spasm  Dispense: 90 tablet; Refill: 1  2. Hypertension,  essential BP has been stable last several visits - Ambulatory referral to Cardiology  3. Controlled type 2 diabetes mellitus with hyperglycemia, without long-term current use of insulin (HCC) Continue current regimen.  Work on eliminating blood sugars - Ambulatory referral to Cardiology  4. Chest pain, unspecified type Does not sound cardiac by history, but she does have risk factors for atypical CP.  I have instructed her to call 911 if CP returns and is severe or is accompanied with dizziness, radiating pain, SOB, etc.  refer to cardiology for risk stratification and work-up   Follow Up Instructions: See PCP in 2 months   I discussed the assessment and treatment plan with the patient. The patient was provided an opportunity to ask questions and all were answered. The patient agreed with the plan and demonstrated an understanding of the instructions.   The patient was advised to call back or seek an in-person evaluation if the symptoms worsen or if the condition fails to improve as anticipated.  I provided 21 minutes of non-face-to-face time during this encounter.   Freeman Caldron, PA-C  Patient ID: UNDRA TREMBATH, female   DOB: [birth year], 48 y.o.   MRN: [id number]

## 2019-05-12 ENCOUNTER — Telehealth: Payer: Self-pay | Admitting: Internal Medicine

## 2019-05-12 DIAGNOSIS — I10 Essential (primary) hypertension: Secondary | ICD-10-CM

## 2019-05-12 MED ORDER — LOSARTAN POTASSIUM 25 MG PO TABS: 25.0000 mg | ORAL_TABLET | Freq: Every day | ORAL | 2 refills | Status: DC

## 2019-05-12 MED FILL — LOSARTAN POTASSIUM 25 MG TA: 25 | 30 days supply | Qty: 30 | Fill #0

## 2019-05-12 NOTE — Telephone Encounter (Signed)
1) Medication(s) Requested (by name): -losartan (COZAAR) 25 MG tablet   2) Pharmacy of Choice: -[place] [place] [address]

## 2019-05-15 MED FILL — metFORMIN HCL 1000 MG TABS: 1000 | 30 days supply | Qty: 60 | Fill #3

## 2019-05-26 ENCOUNTER — Ambulatory Visit: Payer: Self-pay | Attending: Internal Medicine

## 2019-05-26 ENCOUNTER — Other Ambulatory Visit: Payer: Self-pay

## 2019-06-14 MED FILL — metFORMIN HCL 1000 MG TABS: 1000 | 30 days supply | Qty: 60 | Fill #4

## 2019-06-14 MED FILL — LOSARTAN POTASSIUM 25 MG TA: 25 | 30 days supply | Qty: 30 | Fill #1

## 2019-06-23 ENCOUNTER — Ambulatory Visit: Payer: Self-pay | Attending: Internal Medicine | Admitting: Pharmacist

## 2019-06-23 ENCOUNTER — Other Ambulatory Visit: Payer: Self-pay

## 2019-06-23 DIAGNOSIS — Z23 Encounter for immunization: Secondary | ICD-10-CM

## 2019-06-23 NOTE — Progress Notes (Signed)
Patient presents for vaccination against influenza per orders of Dr. Johnson. Consent given. Counseling provided. No contraindications exists. Vaccine administered without incident.   

## 2019-06-26 ENCOUNTER — Ambulatory Visit: Payer: No Typology Code available for payment source | Admitting: Internal Medicine

## 2019-07-12 MED FILL — LOSARTAN POTASSIUM 25 MG TA: 25 | 30 days supply | Qty: 30 | Fill #2

## 2019-07-17 MED FILL — metFORMIN HCL 1000 MG TABS: 1000 | 30 days supply | Qty: 60 | Fill #5

## 2019-08-14 MED FILL — LOSARTAN POTASSIUM 25 MG TA: 25 | 30 days supply | Qty: 30 | Fill #4

## 2019-08-22 ENCOUNTER — Ambulatory Visit: Payer: No Typology Code available for payment source | Admitting: Internal Medicine

## 2019-09-07 ENCOUNTER — Encounter: Payer: Self-pay | Admitting: Internal Medicine

## 2019-09-07 ENCOUNTER — Ambulatory Visit: Payer: Self-pay | Attending: Internal Medicine | Admitting: Internal Medicine

## 2019-09-07 ENCOUNTER — Other Ambulatory Visit: Payer: Self-pay

## 2019-09-07 DIAGNOSIS — Z538 Procedure and treatment not carried out for other reasons: Secondary | ICD-10-CM

## 2019-09-07 NOTE — Progress Notes (Signed)
PC place the patient using [employer] interpreter to do her telephone visit.  Interpreter number was [id number].  We got patient's voicemail.  She left a message letting patient know that I was calling to do her telephone visit and that she can call us back to reschedule.

## 2019-09-11 ENCOUNTER — Other Ambulatory Visit: Payer: Self-pay | Admitting: Internal Medicine

## 2019-09-11 DIAGNOSIS — I10 Essential (primary) hypertension: Secondary | ICD-10-CM

## 2019-09-11 MED FILL — metFORMIN HCL 1000 MG TABS: 1000 | 30 days supply | Qty: 60 | Fill #6

## 2019-09-14 MED FILL — LOSARTAN POTASSIUM 25 MG TA: 25 | 30 days supply | Qty: 30 | Fill #0

## 2019-10-11 ENCOUNTER — Other Ambulatory Visit: Payer: Self-pay | Admitting: Internal Medicine

## 2019-10-11 DIAGNOSIS — I10 Essential (primary) hypertension: Secondary | ICD-10-CM

## 2019-10-13 ENCOUNTER — Other Ambulatory Visit: Payer: Self-pay | Admitting: Internal Medicine

## 2019-10-13 DIAGNOSIS — I10 Essential (primary) hypertension: Secondary | ICD-10-CM

## 2019-10-16 ENCOUNTER — Other Ambulatory Visit: Payer: Self-pay | Admitting: Internal Medicine

## 2019-10-16 ENCOUNTER — Other Ambulatory Visit: Payer: Self-pay | Admitting: Pharmacist

## 2019-10-16 DIAGNOSIS — I10 Essential (primary) hypertension: Secondary | ICD-10-CM

## 2019-10-16 MED ORDER — METFORMIN HCL 1000 MG PO TABS: 1000.0000 mg | ORAL_TABLET | Freq: Two times a day (BID) | ORAL | 0 refills | Status: DC

## 2019-10-16 MED ORDER — LOSARTAN POTASSIUM 25 MG PO TABS: 25.0000 mg | ORAL_TABLET | Freq: Every day | ORAL | 0 refills | Status: DC

## 2019-10-16 MED FILL — LOSARTAN POTASSIUM 25 MG TA: 25 | 30 days supply | Qty: 30 | Fill #0

## 2019-10-16 MED FILL — metFORMIN HCL 1000 MG TABS: 1000 | 30 days supply | Qty: 60 | Fill #0

## 2019-10-19 ENCOUNTER — Other Ambulatory Visit: Payer: Self-pay

## 2019-10-19 ENCOUNTER — Ambulatory Visit: Payer: Self-pay | Attending: Internal Medicine | Admitting: Physician Assistant

## 2019-10-19 DIAGNOSIS — I10 Essential (primary) hypertension: Secondary | ICD-10-CM

## 2019-10-19 DIAGNOSIS — E1165 Type 2 diabetes mellitus with hyperglycemia: Secondary | ICD-10-CM

## 2019-10-19 DIAGNOSIS — M25519 Pain in unspecified shoulder: Secondary | ICD-10-CM

## 2019-10-19 DIAGNOSIS — E785 Hyperlipidemia, unspecified: Secondary | ICD-10-CM

## 2019-10-19 DIAGNOSIS — R0789 Other chest pain: Secondary | ICD-10-CM

## 2019-10-19 DIAGNOSIS — G8929 Other chronic pain: Secondary | ICD-10-CM

## 2019-10-19 MED ORDER — METFORMIN HCL 1000 MG PO TABS: 1000.0000 mg | ORAL_TABLET | Freq: Two times a day (BID) | ORAL | 3 refills | Status: DC

## 2019-10-19 MED ORDER — ATORVASTATIN CALCIUM 10 MG PO TABS: 10.0000 mg | ORAL_TABLET | Freq: Every day | ORAL | 3 refills | Status: DC

## 2019-10-19 MED ORDER — SITAGLIPTIN PHOSPHATE 50 MG PO TABS: 50.0000 mg | ORAL_TABLET | Freq: Every day | ORAL | 2 refills | Status: DC

## 2019-10-19 MED ORDER — ASPIRIN EC 81 MG PO TBEC: 81.0000 mg | DELAYED_RELEASE_TABLET | Freq: Every day | ORAL | 11 refills | Status: AC

## 2019-10-19 MED ORDER — LOSARTAN POTASSIUM 25 MG PO TABS: 25.0000 mg | ORAL_TABLET | Freq: Every day | ORAL | 3 refills | Status: DC

## 2019-10-19 MED ORDER — NAPROXEN 500 MG PO TABS: 500.0000 mg | ORAL_TABLET | Freq: Two times a day (BID) | ORAL | 0 refills | Status: DC

## 2019-10-19 MED FILL — $JANUVIA 50 MG TABLET: 50 | 90 days supply | Qty: 90 | Fill #0

## 2019-10-19 MED FILL — ATORVASTATIN 10 MG TABLET: 10 | 30 days supply | Qty: 30 | Fill #0

## 2019-10-19 MED FILL — NAPROXEN 500 MG TABLET: 500 | 30 days supply | Qty: 60 | Fill #0

## 2019-10-19 NOTE — Progress Notes (Signed)
Name and DOB verified  Pt needs BP med / has not taken all prescribed meds / needs clarification on her meds   CBG higher hundreds / last reading today was 175 [employer] [phone]

## 2019-10-19 NOTE — Progress Notes (Signed)
Patient ID: Connie Russell, female   DOB: 1971-07-30, 49 y.o.   MRN: [id number] Virtual Visit via Telephone Note  I connected with Annely Sliva Aguilar-Reyes on 10/19/19 at  9:30 AM EST by telephone and verified that I am speaking with the correct person using two identifiers.   I discussed the limitations, risks, security and privacy concerns of performing an evaluation and management service by telephone and the availability of in person appointments. I also discussed with the patient that there may be a patient responsible charge related to this service. The patient expressed understanding and agreed to proceed.  PATIENT visit by telephone virtually in the context of Covid-19 pandemic. Patient location:  home My Location:  [facility] office Persons on the call:  Me, interpreter(Maria), and the patient  History of Present Illness: Patient needs RF on her meds.  Also having some B shoulder and elbow pain for several weeks.  Taking tylenol without relief.  No fever.  NKI.  Blood sugars running in the 170s.  She is not taking her cholesterol meds and she never started [place] that was prescribed for her(says she didn't know about it).  No other issues/concerns.  She is going to come tomorrow to have labwork    Observations/Objective: NAD.  A&Ox3  Assessment and Plan: 1. Controlled type 2 diabetes mellitus with hyperglycemia, without long-term current use of insulin (HCC) Not controlled based on home numbers.  Get labs tomorrow.  Add Januvia - Hemoglobin A1c; Future - Comprehensive metabolic panel; Future - CBC with Differential/Platelet; Future - metFORMIN (GLUCOPHAGE) 1000 MG tablet; Take 1 tablet (1,000 mg total) by mouth 2 (two) times daily with a meal.  Dispense: 60 tablet; Refill: 3 - sitaGLIPtin (JANUVIA) 50 MG tablet; Take 1 tablet (50 mg total) by mouth daily.  Dispense: 30 tablet; Refill: 2  2. Hypertension, essential - Comprehensive metabolic panel; Future - CBC with  Differential/Platelet; Future - losartan (COZAAR) 25 MG tablet; Take 1 tablet (25 mg total) by mouth daily.  Dispense: 30 tablet; Refill: 3  3. Hyperlipidemia, unspecified hyperlipidemia type - Lipid panel; Future - atorvastatin (LIPITOR) 10 MG tablet; Take 1 tablet (10 mg total) by mouth daily.  Dispense: 30 tablet; Refill: 3  4. Chest discomfort - aspirin EC 81 MG tablet; Take 1 tablet (81 mg total) by mouth daily.  Dispense: 30 tablet; Refill: 11  5. Essential hypertension - losartan (COZAAR) 25 MG tablet; Take 1 tablet (25 mg total) by mouth daily.  Dispense: 30 tablet; Refill: 3  6. Chronic shoulder pain, unspecified laterality - naproxen (NAPROSYN) 500 MG tablet; Take 1 tablet (500 mg total) by mouth 2 (two) times daily with a meal. Prn pain  Dispense: 60 tablet; Refill: 0   Follow Up Instructions: See PCP in 3 months   I discussed the assessment and treatment plan with the patient. The patient was provided an opportunity to ask questions and all were answered. The patient agreed with the plan and demonstrated an understanding of the instructions.   The patient was advised to call back or seek an in-person evaluation if the symptoms worsen or if the condition fails to improve as anticipated.  I provided 14 minutes of non-face-to-face time during this encounter.   [place], PA-C

## 2019-10-20 ENCOUNTER — Ambulatory Visit: Payer: Self-pay | Attending: Internal Medicine

## 2019-10-20 ENCOUNTER — Other Ambulatory Visit: Payer: Self-pay

## 2019-10-20 DIAGNOSIS — E785 Hyperlipidemia, unspecified: Secondary | ICD-10-CM

## 2019-10-20 DIAGNOSIS — I10 Essential (primary) hypertension: Secondary | ICD-10-CM

## 2019-10-20 DIAGNOSIS — E1165 Type 2 diabetes mellitus with hyperglycemia: Secondary | ICD-10-CM

## 2019-10-21 LAB — HEMOGLOBIN A1C
Est. average glucose Bld gHb Est-mCnc: 140 mg/dL
Hgb A1c MFr Bld: 6.5 % — ABNORMAL HIGH (ref 4.8–5.6)

## 2019-10-21 LAB — LIPID PANEL
Chol/HDL Ratio: 3.8 ratio (ref 0.0–4.4)
Cholesterol, Total: 207 mg/dL — ABNORMAL HIGH (ref 100–199)
HDL: 55 mg/dL (ref >39)
LDL Chol Calc (NIH): 121 mg/dL — ABNORMAL HIGH (ref 0–99)
Triglycerides: 175 mg/dL — ABNORMAL HIGH (ref 0–149)
VLDL Cholesterol Cal: 31 mg/dL (ref 5–40)

## 2019-10-21 LAB — COMPREHENSIVE METABOLIC PANEL
ALT: 16 IU/L (ref 0–32)
AST: 15 IU/L (ref 0–40)
Albumin/Globulin Ratio: 2 (ref 1.2–2.2)
Albumin: 4.7 g/dL (ref 3.8–4.8)
Alkaline Phosphatase: 61 IU/L (ref 39–117)
BUN/Creatinine Ratio: 21 (ref 9–23)
BUN: 12 mg/dL (ref 6–24)
Bilirubin Total: 0.4 mg/dL (ref 0.0–1.2)
CO2: 23 mmol/L (ref 20–29)
Calcium: 9.3 mg/dL (ref 8.7–10.2)
Chloride: 99 mmol/L (ref 96–106)
Creatinine, Ser: 0.56 mg/dL — ABNORMAL LOW (ref 0.57–1.00)
GFR calc Af Amer: 128 mL/min/{1.73_m2} (ref >59)
GFR calc non Af Amer: 111 mL/min/{1.73_m2} (ref >59)
Globulin, Total: 2.3 g/dL (ref 1.5–4.5)
Glucose: 115 mg/dL — ABNORMAL HIGH (ref 65–99)
Potassium: 4.8 mmol/L (ref 3.5–5.2)
Sodium: 138 mmol/L (ref 134–144)
Total Protein: 7 g/dL (ref 6.0–8.5)

## 2019-10-21 LAB — CBC WITH DIFFERENTIAL/PLATELET
Basophils Absolute: 0.1 10*3/uL (ref 0.0–0.2)
Basos: 1 %
EOS (ABSOLUTE): 0.2 10*3/uL (ref 0.0–0.4)
Eos: 2 %
Hematocrit: 44.3 % (ref 34.0–46.6)
Hemoglobin: 15.6 g/dL (ref 11.1–15.9)
Immature Grans (Abs): 0 10*3/uL (ref 0.0–0.1)
Immature Granulocytes: 0 %
Lymphocytes Absolute: 3.6 10*3/uL — ABNORMAL HIGH (ref 0.7–3.1)
Lymphs: 40 %
MCH: 32 pg (ref 26.6–33.0)
MCHC: 35.2 g/dL (ref 31.5–35.7)
MCV: 91 fL (ref 79–97)
Monocytes Absolute: 0.5 10*3/uL (ref 0.1–0.9)
Monocytes: 6 %
Neutrophils Absolute: 4.7 10*3/uL (ref 1.4–7.0)
Neutrophils: 51 %
Platelets: 317 10*3/uL (ref 150–450)
RBC: 4.88 x10E6/uL (ref 3.77–5.28)
RDW: 12 % (ref 11.7–15.4)
WBC: 9 10*3/uL (ref 3.4–10.8)

## 2019-11-09 ENCOUNTER — Other Ambulatory Visit: Payer: Self-pay | Admitting: Internal Medicine

## 2019-11-09 DIAGNOSIS — Z1231 Encounter for screening mammogram for malignant neoplasm of breast: Secondary | ICD-10-CM

## 2019-11-09 MED FILL — LOSARTAN POTASSIUM 25 MG TA: 25 | 30 days supply | Qty: 30 | Fill #0

## 2019-11-18 ENCOUNTER — Ambulatory Visit: Payer: No Typology Code available for payment source | Attending: Internal Medicine

## 2019-11-18 DIAGNOSIS — Z23 Encounter for immunization: Secondary | ICD-10-CM

## 2019-11-18 NOTE — Progress Notes (Signed)
   Covid-19 Vaccination Clinic  Name:  KITTI MCCLISH    MRN: [id number] DOB: 1970/12/03  11/18/2019  Ms. Aguilar-Reyes was observed post Covid-19 immunization for 15 minutes without incident. She was provided with Vaccine Information Sheet and instruction to access the V-Safe system.   Ms. Aris Georgia was instructed to call 911 with any severe reactions post vaccine: Marland Kitchen Difficulty breathing  . Swelling of face and throat  . A fast heartbeat  . A bad rash all over body  . Dizziness and weakness   Immunizations Administered    Name Date Dose VIS Date Route   Pfizer COVID-19 Vaccine 11/18/2019 10:08 AM 0.3 mL 07/21/2019 Intramuscular   Manufacturer: [employer], [employer]   Lot: [id number]   NDC: [address]-2

## 2019-11-28 MED FILL — ATORVASTATIN 10 MG TABLET: 10 | 30 days supply | Qty: 30 | Fill #1

## 2019-12-11 MED FILL — metFORMIN HCL 1000 MG TABS: 1000 | 30 days supply | Qty: 60 | Fill #0

## 2019-12-11 MED FILL — LOSARTAN POTASSIUM 25 MG TA: 25 | 30 days supply | Qty: 30 | Fill #1

## 2019-12-13 ENCOUNTER — Ambulatory Visit: Payer: No Typology Code available for payment source | Attending: Internal Medicine

## 2019-12-13 DIAGNOSIS — Z23 Encounter for immunization: Secondary | ICD-10-CM

## 2019-12-13 NOTE — Progress Notes (Signed)
   Covid-19 Vaccination Clinic  Name:  Connie Russell    MRN: [id number] DOB: 1971/06/06  12/13/2019  Connie Russell was observed post Covid-19 immunization for 15 minutes without incident. She was provided with Vaccine Information Sheet and instruction to access the V-Safe system.   Connie Russell was instructed to call 911 with any severe reactions post vaccine: Marland Kitchen Difficulty breathing  . Swelling of face and throat  . A fast heartbeat  . A bad rash all over body  . Dizziness and weakness   Immunizations Administered    Name Date Dose VIS Date Route   Pfizer COVID-19 Vaccine 12/13/2019  8:36 AM 0.3 mL 10/04/2018 Intramuscular   Manufacturer: [employer], [employer]   Lot: [id number]   NDC: [address]-5

## 2020-01-09 MED FILL — ATORVASTATIN 10 MG TABLET: 10 | 30 days supply | Qty: 30 | Fill #2

## 2020-01-09 MED FILL — LOSARTAN POTASSIUM 25 MG TA: 25 | 30 days supply | Qty: 30 | Fill #2

## 2020-01-25 ENCOUNTER — Other Ambulatory Visit: Payer: Self-pay

## 2020-01-25 ENCOUNTER — Ambulatory Visit
Admission: RE | Admit: 2020-01-25 | Discharge: 2020-01-25 | Disposition: A | Discharge status: Home / Self Care | Payer: No Typology Code available for payment source | Source: Ambulatory Visit | Attending: Internal Medicine | Admitting: Internal Medicine

## 2020-01-25 DIAGNOSIS — Z1231 Encounter for screening mammogram for malignant neoplasm of breast: Secondary | ICD-10-CM

## 2020-01-29 ENCOUNTER — Telehealth: Payer: Self-pay

## 2020-01-29 NOTE — Telephone Encounter (Signed)
[employer] interpreters [place]  Id# [id number]  contacted pt to go over MM results pt is aware and doesn't have any questions or concerns

## 2020-02-13 MED FILL — metFORMIN HCL 1000 MG TABS: 1000 | 30 days supply | Qty: 60 | Fill #1

## 2020-02-13 MED FILL — ?ATORVASTATIN 10 MG TABLET: 10 | 30 days supply | Qty: 30 | Fill #3

## 2020-02-13 MED FILL — LOSARTAN POTASSIUM 25 MG TA: 25 | 30 days supply | Qty: 30 | Fill #3

## 2020-02-27 ENCOUNTER — Telehealth: Payer: Self-pay

## 2020-02-27 NOTE — Telephone Encounter (Signed)
Pt came stating someone called her & she was unsure who. Upon review found none of the providers or staff at [facility] contacted her. Pt listed again to message from"Brenda". Advised pt to contact hosp financial dept for further assistance.

## 2020-03-06 ENCOUNTER — Ambulatory Visit: Payer: Self-pay | Attending: Physician Assistant | Admitting: Physician Assistant

## 2020-03-06 ENCOUNTER — Encounter: Payer: Self-pay | Admitting: Physician Assistant

## 2020-03-06 ENCOUNTER — Other Ambulatory Visit: Payer: Self-pay

## 2020-03-06 VITALS — BP 146/100 | HR 98 | Resp 16 | Wt 146.4 lb

## 2020-03-06 DIAGNOSIS — I10 Essential (primary) hypertension: Secondary | ICD-10-CM

## 2020-03-06 DIAGNOSIS — E1165 Type 2 diabetes mellitus with hyperglycemia: Secondary | ICD-10-CM

## 2020-03-06 DIAGNOSIS — Z789 Other specified health status: Secondary | ICD-10-CM

## 2020-03-06 DIAGNOSIS — K429 Umbilical hernia without obstruction or gangrene: Secondary | ICD-10-CM

## 2020-03-06 NOTE — Progress Notes (Signed)
Connie Russell, is a 49 y.o. female  HOZ:[id number]  BCW:[id number]  DOB - May 14, 1971  Subjective:  Chief Complaint and HPI: Connie Russell is a 49 y.o. female here today for hernia that she has had for years and wants to get repaired.  She thinks it may be larger than it was previously.  Appetite is good. Hernia is painful when she tries to sit up or bend over.  Pain is not constant and is mild in nature.   Not taking BP meds or diabetes meds every day-"most days."    Connie Russell with [employer] interpreters used  ROS:   Constitutional:  No f/c, No night sweats, No unexplained weight loss. EENT:  No vision changes, No blurry vision, No hearing changes. No mouth, throat, or ear problems.  Respiratory: No cough, No SOB Cardiac: No CP, no palpitations GI:  No abd pain, No N/V/D. GU: No Urinary s/sx Musculoskeletal: No joint pain Neuro: No headache, no dizziness, no motor weakness.  Skin: No rash Endocrine:  No polydipsia. No polyuria.  Psych: Denies SI/HI  No problems updated.  ALLERGIES: Allergies  Allergen Reactions  . Linzess [Linaclotide]     DIARRHEA AT 145 MICROG    PAST MEDICAL HISTORY: Past Medical History:  Diagnosis Date  . Diabetes ([place])   . Diabetes mellitus without complication ([place])   . Gastritis   . GERD (gastroesophageal reflux disease)   . Hypertension     MEDICATIONS AT HOME: Prior to Admission medications   Medication Sig Start Date End Date Taking? Authorizing Provider  aspirin EC 81 MG tablet Take 1 tablet (81 mg total) by mouth daily. 10/19/19   Connie Donovan, PA-C  atorvastatin (LIPITOR) 10 MG tablet Take 1 tablet (10 mg total) by mouth daily. 10/19/19   Connie Donovan, PA-C  Blood Glucose Monitoring Suppl (TRUE METRIX METER) w/Device KIT Use as directed 01/12/19   Connie Pier, MD  glucose blood (TRUE METRIX BLOOD GLUCOSE TEST) test strip Use as instructed 01/12/19   Connie Pier, MD  ibuprofen (ADVIL) 600 MG tablet Take 1  tablet (600 mg total) by mouth 3 (three) times daily. For 3 days prior to period starting and prn pain Patient not taking: Reported on 10/19/2019 04/19/19   Connie Donovan, PA-C  losartan (COZAAR) 25 MG tablet Take 1 tablet (25 mg total) by mouth daily. 10/19/19   Connie Donovan, PA-C  metFORMIN (GLUCOPHAGE) 1000 MG tablet Take 1 tablet (1,000 mg total) by mouth 2 (two) times daily with a meal. 10/19/19   Connie Russell, Connie Bucy, PA-C  methocarbamol (ROBAXIN) 500 MG tablet Take 1 tablet (500 mg total) by mouth 3 (three) times daily. 3 days prior to menstrual cycle and prn muscle spasm Patient not taking: Reported on 10/19/2019 04/19/19   Connie Donovan, PA-C  naproxen (NAPROSYN) 500 MG tablet Take 1 tablet (500 mg total) by mouth 2 (two) times daily with a meal. Prn pain 10/19/19   Connie Donovan, PA-C  sitaGLIPtin (JANUVIA) 50 MG tablet Take 1 tablet (50 mg total) by mouth daily. 10/19/19   Connie Donovan, PA-C  TRUEplus Lancets 28G MISC Use as directed 01/12/19   Connie Pier, MD  Vitamin D, Cholecalciferol, 400 units CAPS Take 400 Units by mouth every morning. 01/01/17   Connie Pier, MD     Objective:  EXAM:   Vitals:   03/06/20 1058  BP: (!) 146/100  Pulse: 98  Resp: 16  SpO2: 98%  Weight: 146  lb 6.4 oz (66.4 kg)    General appearance : A&OX3. NAD. Non-toxic-appearing HEENT: Atraumatic and Normocephalic.  PERRLA. EOM intact.   Chest/Lungs:  Breathing-non-labored, Good air entry bilaterally, breath sounds normal without rales, rhonchi, or wheezing  CVS: S1 S2 regular, no murmurs, gallops, rubs  Abdomen: Bowel sounds present, Non tender and not distended with no gaurding, rigidity or rebound.  6X4 cm umbilical hernia(may be ventral as well) that is non tender and is easily reducible Extremities: Bilateral Lower Ext shows no edema, both legs are warm to touch with = pulse throughout Neurology:  CN II-XII grossly intact, Non focal.   Psych:  TP linear. J/I WNL. Normal speech.  Appropriate eye contact and affect.  Skin:  No Rash  Data Review Lab Results  Component Value Date   HGBA1C 6.5 (H) 10/20/2019   HGBA1C 7.6 (H) 02/02/2019   HGBA1C 8.5 (A) 07/12/2018     Assessment & Plan   1. Controlled type 2 diabetes mellitus with hyperglycemia, without long-term current use of insulin (HCC) Not checking blood sugars regularly(we are out of lancets in the clinic today), but she has had readings from 130 to 300 - Hemoglobin A1c  2. Umbilical hernia without obstruction and without gangrene - Ambulatory referral to General Surgery  3. Language barrier [employer] interpreters used and additional time performing visit was required.   4. Hypertension, essential Take Losartan!!!  She has not been taking it daily   Patient have been counseled extensively about nutrition and exercise  Return in about 3 months (around 06/06/2020) for PCP;  chronic conditions.  The patient was given clear instructions to go to ER or return to medical center if symptoms don't improve, worsen or new problems develop. The patient verbalized understanding. The patient was told to call to get lab results if they haven't heard anything in the next week.     Connie Caldron, PA-C [facility] and [place], [place]   03/06/2020, 11:15 AMPatient ID: Connie Russell, female   DOB: 05/09/1971, 49 y.o.   MRN: [id number]

## 2020-03-07 LAB — HEMOGLOBIN A1C
Est. average glucose Bld gHb Est-mCnc: 154 mg/dL
Hgb A1c MFr Bld: 7 % — ABNORMAL HIGH (ref 4.8–5.6)

## 2020-03-12 ENCOUNTER — Other Ambulatory Visit: Payer: Self-pay | Admitting: Physician Assistant

## 2020-03-12 DIAGNOSIS — I10 Essential (primary) hypertension: Secondary | ICD-10-CM

## 2020-03-12 MED FILL — LOSARTAN POTASSIUM 25 MG TA: 25 | 30 days supply | Qty: 30 | Fill #0

## 2020-03-13 ENCOUNTER — Ambulatory Visit: Payer: Self-pay | Attending: Internal Medicine

## 2020-03-13 ENCOUNTER — Other Ambulatory Visit: Payer: Self-pay

## 2020-04-01 MED FILL — LOSARTAN POTASSIUM 25 MG TA: 25 | 30 days supply | Qty: 30 | Fill #1

## 2020-04-01 MED FILL — metFORMIN HCL 1000 MG TABS: 1000 | 30 days supply | Qty: 60 | Fill #2

## 2020-04-08 ENCOUNTER — Encounter: Payer: Self-pay | Admitting: Emergency Medicine

## 2020-04-08 ENCOUNTER — Ambulatory Visit (INDEPENDENT_AMBULATORY_CARE_PROVIDER_SITE_OTHER): Payer: Self-pay | Admitting: Surgery

## 2020-04-08 ENCOUNTER — Telehealth: Payer: Self-pay | Admitting: Emergency Medicine

## 2020-04-08 ENCOUNTER — Encounter: Payer: Self-pay | Admitting: Surgery

## 2020-04-08 ENCOUNTER — Other Ambulatory Visit: Payer: Self-pay

## 2020-04-08 VITALS — BP 152/94 | HR 112 | Temp 98.2°F | Resp 12 | Wt 145.6 lb

## 2020-04-08 DIAGNOSIS — K429 Umbilical hernia without obstruction or gangrene: Secondary | ICD-10-CM

## 2020-04-08 NOTE — H&P (View-Only) (Signed)
04/08/2020  Reason for Visit:  Umbilical hernia  Referring Provider:  Freeman Caldron, PA-C  History of Present Illness: Connie Russell is a 49 y.o. female presenting for evaluation of umbilical hernia.  Patient thinks that she has had this hernia for many years probably at least 5 years overall.  She has noticed that is been growing in size and also causes some symptoms.  She reports having some discomfort or pain at the umbilicus and feels that things bulge and get sometimes hard.  She reports sometimes having nausea or vomiting but she also has issues with GERD and is not sure that can be more related to it.  However the discomfort is more at the umbilicus rather than upper abdomen.  Denies any fevers or chills.  Reports having constipation and she sometimes takes homemade remedies to help with her constipation but overall she may go either once a day versus once every 2 to 3 days.  Denies any blood in the stool.  She is able to eat and tolerates well with no issues.  Past Medical History: Past Medical History:  Diagnosis Date  . Diabetes ([place])   . Diabetes mellitus without complication ([place])   . Gastritis   . GERD (gastroesophageal reflux disease)   . Hyperlipidemia   . Hypertension      Past Surgical History: Past Surgical History:  Procedure Laterality Date  . ESOPHAGOGASTRODUODENOSCOPY N/A 06/24/2015   SLF: 1. Abdominal pain most likely due to constipation, GERD, and Gastritis. 2. MIld NON-erosive gastritis.  [facility] Kitchen FOOT SURGERY Left 2000   Local    Home Medications: Prior to Admission medications   Medication Sig Start Date End Date Taking? Authorizing Provider  aspirin EC 81 MG tablet Take 1 tablet (81 mg total) by mouth daily. 10/19/19  Yes Freeman Caldron M, PA-C  atorvastatin (LIPITOR) 10 MG tablet Take 1 tablet (10 mg total) by mouth daily. 10/19/19  Yes Argentina Donovan, PA-C  Blood Glucose Monitoring Suppl (TRUE METRIX METER) w/Device KIT Use as directed 01/12/19  Yes  Ladell Pier, MD  glucose blood (TRUE METRIX BLOOD GLUCOSE TEST) test strip Use as instructed 01/12/19  Yes Ladell Pier, MD  ibuprofen (ADVIL) 600 MG tablet Take 1 tablet (600 mg total) by mouth 3 (three) times daily. For 3 days prior to period starting and prn pain 04/19/19  Yes Freeman Caldron M, PA-C  metFORMIN (GLUCOPHAGE) 1000 MG tablet Take 1 tablet (1,000 mg total) by mouth 2 (two) times daily with a meal. 10/19/19  Yes McClung, Angela M, PA-C  methocarbamol (ROBAXIN) 500 MG tablet Take 1 tablet (500 mg total) by mouth 3 (three) times daily. 3 days prior to menstrual cycle and prn muscle spasm 04/19/19  Yes Freeman Caldron M, PA-C  naproxen (NAPROSYN) 500 MG tablet Take 1 tablet (500 mg total) by mouth 2 (two) times daily with a meal. Prn pain 10/19/19  Yes McClung, Angela M, PA-C  sitaGLIPtin (JANUVIA) 50 MG tablet Take 1 tablet (50 mg total) by mouth daily. 10/19/19  Yes Argentina Donovan, PA-C  TRUEplus Lancets 28G MISC Use as directed 01/12/19  Yes Ladell Pier, MD  Vitamin D, Cholecalciferol, 400 units CAPS Take 400 Units by mouth every morning. 01/01/17  Yes Ladell Pier, MD    Allergies: Allergies  Allergen Reactions  . Linzess [Linaclotide]     DIARRHEA AT 145 MICROG    Social History:  reports that she has never smoked. She has never used smokeless tobacco. She  reports that she does not drink alcohol and does not use drugs.   Family History: Family History  Problem Relation Age of Onset  . Hypertension Mother   . Arthritis Mother   . Diabetes Maternal Grandmother   . Colon cancer Neg Hx     Review of Systems: Review of Systems  Constitutional: Negative for chills and fever.  HENT: Negative for hearing loss.   Cardiovascular: Negative for chest pain.  Gastrointestinal: Positive for abdominal pain, constipation, nausea and vomiting. Negative for diarrhea.  Genitourinary: Negative for dysuria.  Musculoskeletal: Negative for myalgias.  Skin: Negative for  rash.  Neurological: Negative for dizziness.  Psychiatric/Behavioral: Negative for depression.    Physical Exam BP (!) 152/94   Pulse (!) 112   Temp 98.2 F (36.8 C)   Resp 12   Wt 145 lb 9.6 oz (66 kg)   SpO2 96%   BMI 24.99 kg/m  CONSTITUTIONAL: No acute distress HEENT:  Normocephalic, atraumatic, extraocular motion intact. NECK: Trachea is midline, and there is no jugular venous distension.  RESPIRATORY:  Lungs are clear, and breath sounds are equal bilaterally. Normal respiratory effort without pathologic use of accessory muscles. CARDIOVASCULAR: Heart is regular without murmurs, gallops, or rubs. GI: The abdomen is soft, nondistended, with some mild tenderness with deep palpation at the umbilicus.  Patient has a reducible umbilical hernia about 2 cm in size.  There is also a narrow component of diastasis recti of the upper abdomen.   MUSCULOSKELETAL:  Normal muscle strength and tone in all four extremities.  No peripheral edema or cyanosis. SKIN: Skin turgor is normal. There are no pathologic skin lesions.  NEUROLOGIC:  Motor and sensation is grossly normal.  Cranial nerves are grossly intact. PSYCH:  Alert and oriented to person, place and time. Affect is normal.  Laboratory Analysis: Labs from 10/20/2019: Sodium 138, potassium 4.8, chloride 99, CO2 23, BUN 12, creatinine 0.56.  LFTs within normal limits.  WBC 9, hemoglobin 13.6, hematocrit 44.3, platelet 317.  Hemoglobin A1c 7.0 on 03/06/2020  Imaging: CT scan on 03/04/2017 shows a small fat-containing umbilical hernia.   Assessment and Plan: This is a 49 y.o. female with an umbilical hernia.  -Discussed with the patient that she indeed has an umbilical hernia which overall is small in size but given her symptoms, I think is appropriate to proceed with repair.  Discussed with her that we could proceed with an open umbilical hernia repair with mesh placement to help reinforce the repair.  Discussed with her the risks of  bleeding, infection, injury to surrounding structures.  Discussed with her that this would be an outpatient procedure that she would have a postoperative activity restriction of no heavy lifting or pushing of no more than 10 to 15 pounds for a period of 4 weeks.  She is willing to proceed. -Patient will be scheduled tentatively for next week on 04/17/2020.  We will send medical clearance to her PCP.  She has been instructed to stop her aspirin 5 days prior to surgery with her last dose being on 9/2.  She also understands that she will need Covid testing prior to surgery.  Face-to-face time spent with the patient and care providers was 60 minutes, with more than 50% of the time spent counseling, educating, and coordinating care of the patient.     Melvyn Neth, [facility]

## 2020-04-08 NOTE — Patient Instructions (Addendum)
You have requested for your Umbilical Hernia be repaired. This has been scheduled on 04/17/20 by Dr. Aleen Campi at [facility].  Please see your (pink)pre-care sheet for information.  You will need to arrange to be off work for 1-2 weeks but will have to have a lifting restriction of no more than 15 lbs for 6 weeks following your surgery.  We will send a Medical Clearance to your primary care provider to make sure we can proceed with surgery. Their office will contact you if they need to see you prior to getting this clearance.  You will need to STOP your Aspirin 5 days prior to surgery. Last dose will be on 04/11/20.  Ha solicitado la reparacin de su hernia umbilical. Esto ha sido programado para el 04/17/20 por el Dr. Aleen Campi en [facility].  Consulte su hoja de cuidados previos (rosa) para obtener informacin.  Tendr que hacer arreglos para estar fuera del trabajo durante 1-2 semanas, pero tendr que tener una restriccin de levantamiento de no ms de 15 libras durante las 6 semanas posteriores a la [place].  Enviaremos una autorizacin mdica a su proveedor de atencin primaria para asegurarnos de que podamos proceder con la ciruga. Su oficina se comunicar con usted si necesitan verlo antes de Civil Service fast streamer.   Deber Noralyn Pick su aspirina 5 das antes de la [place]. La ltima dosis ser el 04/11/20.  Hernia umbilical en los adultos Umbilical Hernia, Adult  Una hernia es un bulto de tejido que sobresale a travs de una abertura [employer]. Una hernia umbilical se produce en el abdomen, cerca del ombligo. La hernia puede contener tejidos del intestino delgado, el intestino grueso o tejido graso que recubre el intestino (epipln). Las hernias USAA en los adultos suelen empeorar con el tiempo y requieren tratamiento quirrgico. Hay varios tipos de hernias umbilicales. Es posible que tenga lo siguiente:  Una hernia ubicada justo por debajo o por arriba del  ombligo (hernia indirecta). Es el tipo de hernia umbilical ms frecuente en los adultos.  Una hernia que se forma a travs de una abertura hecha por el ombligo (hernia directa).  Una hernia que aparece y desaparece (hernia reducible). Una hernia reducible puede ser visible solo al hacer fuerza, levantar objetos pesados o toser. Este tipo de hernia se puede reintroducir en el abdomen (reducir).  Una hernia que aprisiona tejido abdominal (hernia encarcelada). Este tipo de hernia es irreducible.  Una hernia que interrumpe el flujo de sangre a los tejidos en su interior (hernia estrangulada). Si esto ocurre, los tejidos Mining engineer a Furniture conservator/restorer. Este tipo de hernia requiere tratamiento de Associate Professor. Cules son las causas? Una hernia umbilical se produce cuando el tejido dentro del abdomen ejerce presin sobre una zona debilitada de los msculos abdominales. Qu incrementa el riesgo? Puede correr un mayor riesgo de Warehouse manager esta afeccin en los siguientes casos:  Tiene obesidad.  Tuvo varios embarazos.  Tiene una acumulacin de lquido dentro del abdomen (ascitis).  Se someti a una ciruga que [employer] abdominales. Cules son los signos o sntomas? El principal sntoma de esta afeccin es un bulto en el ombligo o cerca de este que no causa dolor. Una hernia reducible puede ser visible solo al hacer fuerza, levantar objetos pesados o toser. Otros sntomas pueden incluir lo siguiente:  Dolor sordo.  Sensacin de opresin. Los sntomas de una hernia estrangulada pueden incluir los siguientes:  Dolor que se vuelve cada vez ms intenso.  Nuseas y vmitos.  Dolor al [employer]  presin sobre la hernia.  Cambio de color de la piel que recubre la hernia que se torna roja o violcea.  Estreimiento.  Sangre en las heces. Cmo se diagnostica? Esta afeccin se puede diagnosticar en funcin de lo siguiente:  Un examen fsico. Pueden pedirle que tosa o que haga fuerza mientras  est de pie. Estas acciones aumentan la presin dentro del abdomen y empujan la hernia a travs de la abertura en los msculos. El mdico puede ejercer presin sobre la hernia para tratar de reducirla.  Los sntomas y los antecedentes mdicos. Cmo se trata? La ciruga es el nico tratamiento para una hernia umbilical. En el caso de que la hernia est estrangulada, esta se realiza lo antes posible. Si tiene una hernia pequea que no est encarcelada, tal vez tenga que bajar de peso antes de la [place]. Siga estas indicaciones en su casa:  Baje de peso, si se lo indic el mdico.  No trate de reintroducir la hernia.  Observe si la hernia cambia de color o de tamao. Infrmele al mdico si se producen cambios.  Tal vez deba evitar las actividades que aumentan la presin sobre la hernia.  No levante objetos que pesen ms de 10libras (4.5kg) hasta que el mdico le diga que es seguro.  Tome los medicamentos de venta libre y los recetados solamente como se lo haya indicado el mdico.  Oceanographer a todas las visitas de control como se lo haya indicado el mdico. Esto es importante. Comunquese con un mdico si:  La hernia se agranda.  La hernia le causa dolor. Solicite ayuda de inmediato si:  Tiene un dolor intenso y repentino cerca de la zona de la hernia.  Tiene dolor, as como nuseas o vmitos.  Tiene dolor y la piel que recubre la hernia cambia de color.  Presenta fiebre. Esta informacin no tiene Theme park manager el consejo del mdico. Asegrese de hacerle al mdico cualquier pregunta que tenga. Document Revised: 11/08/2017 Document Reviewed: 05/03/2017 Elsevier Patient Education  2020 [employer].

## 2020-04-08 NOTE — Telephone Encounter (Signed)
Medical Clearance sent to [facility], [place].  [phone]

## 2020-04-08 NOTE — Progress Notes (Signed)
04/08/2020  Reason for Visit:  Umbilical hernia  Referring Provider:  Angela McClung, PA-C  History of Present Illness: Connie Russell is a 48 y.o. female presenting for evaluation of umbilical hernia.  Patient thinks that she has had this hernia for many years probably at least 5 years overall.  She has noticed that is been growing in size and also causes some symptoms.  She reports having some discomfort or pain at the umbilicus and feels that things bulge and get sometimes hard.  She reports sometimes having nausea or vomiting but she also has issues with GERD and is not sure that can be more related to it.  However the discomfort is more at the umbilicus rather than upper abdomen.  Denies any fevers or chills.  Reports having constipation and she sometimes takes homemade remedies to help with her constipation but overall she may go either once a day versus once every 2 to 3 days.  Denies any blood in the stool.  She is able to eat and tolerates well with no issues.  Past Medical History: Past Medical History:  Diagnosis Date  . Diabetes ([facility])   . Diabetes mellitus without complication ([facility])   . Gastritis   . GERD (gastroesophageal reflux disease)   . Hyperlipidemia   . Hypertension      Past Surgical History: Past Surgical History:  Procedure Laterality Date  . ESOPHAGOGASTRODUODENOSCOPY N/A 06/24/2015   SLF: 1. Abdominal pain most likely due to constipation, GERD, and Gastritis. 2. MIld NON-erosive gastritis.  . FOOT SURGERY Left 2000   Local    Home Medications: Prior to Admission medications   Medication Sig Start Date End Date Taking? Authorizing Provider  aspirin EC 81 MG tablet Take 1 tablet (81 mg total) by mouth daily. 10/19/19  Yes McClung, Angela M, PA-C  atorvastatin (LIPITOR) 10 MG tablet Take 1 tablet (10 mg total) by mouth daily. 10/19/19  Yes McClung, Angela M, PA-C  Blood Glucose Monitoring Suppl (TRUE METRIX METER) w/Device KIT Use as directed 01/12/19  Yes  Johnson, Deborah B, MD  glucose blood (TRUE METRIX BLOOD GLUCOSE TEST) test strip Use as instructed 01/12/19  Yes Johnson, Deborah B, MD  ibuprofen (ADVIL) 600 MG tablet Take 1 tablet (600 mg total) by mouth 3 (three) times daily. For 3 days prior to period starting and prn pain 04/19/19  Yes McClung, Angela M, PA-C  metFORMIN (GLUCOPHAGE) 1000 MG tablet Take 1 tablet (1,000 mg total) by mouth 2 (two) times daily with a meal. 10/19/19  Yes McClung, Angela M, PA-C  methocarbamol (ROBAXIN) 500 MG tablet Take 1 tablet (500 mg total) by mouth 3 (three) times daily. 3 days prior to menstrual cycle and prn muscle spasm 04/19/19  Yes McClung, Angela M, PA-C  naproxen (NAPROSYN) 500 MG tablet Take 1 tablet (500 mg total) by mouth 2 (two) times daily with a meal. Prn pain 10/19/19  Yes McClung, Angela M, PA-C  sitaGLIPtin (JANUVIA) 50 MG tablet Take 1 tablet (50 mg total) by mouth daily. 10/19/19  Yes McClung, Angela M, PA-C  TRUEplus Lancets 28G MISC Use as directed 01/12/19  Yes Johnson, Deborah B, MD  Vitamin D, Cholecalciferol, 400 units CAPS Take 400 Units by mouth every morning. 01/01/17  Yes Johnson, Deborah B, MD    Allergies: Allergies  Allergen Reactions  . Linzess [Linaclotide]     DIARRHEA AT 145 MICROG    Social History:  reports that she has never smoked. She has never used smokeless tobacco. She   reports that she does not drink alcohol and does not use drugs.   Family History: Family History  Problem Relation Age of Onset  . Hypertension Mother   . Arthritis Mother   . Diabetes Maternal Grandmother   . Colon cancer Neg Hx     Review of Systems: Review of Systems  Constitutional: Negative for chills and fever.  HENT: Negative for hearing loss.   Cardiovascular: Negative for chest pain.  Gastrointestinal: Positive for abdominal pain, constipation, nausea and vomiting. Negative for diarrhea.  Genitourinary: Negative for dysuria.  Musculoskeletal: Negative for myalgias.  Skin: Negative for  rash.  Neurological: Negative for dizziness.  Psychiatric/Behavioral: Negative for depression.    Physical Exam BP (!) 152/94   Pulse (!) 112   Temp 98.2 F (36.8 C)   Resp 12   Wt 145 lb 9.6 oz (66 kg)   SpO2 96%   BMI 24.99 kg/m  CONSTITUTIONAL: No acute distress HEENT:  Normocephalic, atraumatic, extraocular motion intact. NECK: Trachea is midline, and there is no jugular venous distension.  RESPIRATORY:  Lungs are clear, and breath sounds are equal bilaterally. Normal respiratory effort without pathologic use of accessory muscles. CARDIOVASCULAR: Heart is regular without murmurs, gallops, or rubs. GI: The abdomen is soft, nondistended, with some mild tenderness with deep palpation at the umbilicus.  Patient has a reducible umbilical hernia about 2 cm in size.  There is also a narrow component of diastasis recti of the upper abdomen.   MUSCULOSKELETAL:  Normal muscle strength and tone in all four extremities.  No peripheral edema or cyanosis. SKIN: Skin turgor is normal. There are no pathologic skin lesions.  NEUROLOGIC:  Motor and sensation is grossly normal.  Cranial nerves are grossly intact. PSYCH:  Alert and oriented to person, place and time. Affect is normal.  Laboratory Analysis: Labs from 10/20/2019: Sodium 138, potassium 4.8, chloride 99, CO2 23, BUN 12, creatinine 0.56.  LFTs within normal limits.  WBC 9, hemoglobin 13.6, hematocrit 44.3, platelet 317.  Hemoglobin A1c 7.0 on 03/06/2020  Imaging: CT scan on 03/04/2017 shows a small fat-containing umbilical hernia.   Assessment and Plan: This is a 48 y.o. female with an umbilical hernia.  -Discussed with the patient that she indeed has an umbilical hernia which overall is small in size but given her symptoms, I think is appropriate to proceed with repair.  Discussed with her that we could proceed with an open umbilical hernia repair with mesh placement to help reinforce the repair.  Discussed with her the risks of  bleeding, infection, injury to surrounding structures.  Discussed with her that this would be an outpatient procedure that she would have a postoperative activity restriction of no heavy lifting or pushing of no more than 10 to 15 pounds for a period of 4 weeks.  She is willing to proceed. -Patient will be scheduled tentatively for next week on 04/17/2020.  We will send medical clearance to her PCP.  She has been instructed to stop her aspirin 5 days prior to surgery with her last dose being on 9/2.  She also understands that she will need Covid testing prior to surgery.  Face-to-face time spent with the patient and care providers was 60 minutes, with more than 50% of the time spent counseling, educating, and coordinating care of the patient.     Cas Tracz Luis Momoko Slezak, MD [facility]   

## 2020-04-09 ENCOUNTER — Other Ambulatory Visit: Payer: Self-pay | Admitting: Internal Medicine

## 2020-04-09 ENCOUNTER — Telehealth: Payer: Self-pay | Admitting: Internal Medicine

## 2020-04-09 ENCOUNTER — Encounter
Admission: RE | Admit: 2020-04-09 | Discharge: 2020-04-09 | Disposition: A | Discharge status: Home / Self Care | Payer: No Typology Code available for payment source | Source: Ambulatory Visit | Attending: Surgery | Admitting: Surgery

## 2020-04-09 ENCOUNTER — Telehealth: Payer: Self-pay | Admitting: Surgery

## 2020-04-09 DIAGNOSIS — E785 Hyperlipidemia, unspecified: Secondary | ICD-10-CM

## 2020-04-09 MED ORDER — ATORVASTATIN CALCIUM 10 MG PO TABS: 10.0000 mg | ORAL_TABLET | Freq: Every day | ORAL | 3 refills | Status: DC

## 2020-04-09 NOTE — Patient Instructions (Addendum)
Your procedure is scheduled on: 04/17/20                 COVID TESTING AT [address] [facility] Tuesday 4/0/97 [address]    [phone] Su procedimiento est programado para: Report to [facility] [address] Presntese a: To find out your arrival time please call [phone] between 1PM - 3PM on 04/16/20 . Para saber su hora de llegada por favor llame al [phone] entre la 1PM - 3PM el da:  Remember: Instructions that are not followed completely may result in serious medical risk, up to and including death, or upon the discretion of your surgeon and anesthesiologist your surgery may need to be rescheduled.  Recuerde: Las instrucciones que no se siguen completamente Armed forces logistics/support/administrative officer en un riesgo de salud grave, incluyendo hasta la [place] o a discrecin de su cirujano y Scientific laboratory technician, su ciruga se puede posponer.   __X__ 1. Do not eat food or drink liquids after midnight. No gum chewing or hard candies.  No coma alimentos ni tome lquidos despus de la medianoche.  No mastique chicle ni caramelos  duros.     __X__ 2. No alcohol for 24 hours before or after surgery.    No tome alcohol durante las 24 horas antes ni despus de la [place].   ____ 3. Bring all medications with you on the day of surgery if instructed.    Lleve todos los medicamentos con usted el da de su ciruga si se le ha indicado as.   __X__ 4. Notify your doctor if there is any change in your medical condition (cold, fever,                             infections).    Informe a su mdico si hay algn cambio en su condicin mdica (resfriado, fiebre, infecciones).   Do not wear jewelry, make-up, hairpins, clips or nail polish.  No use joyas, maquillajes, pinzas/ganchos para el cabello ni esmalte de uas.  Do not wear lotions, powders, or perfumes. .  No use lociones, polvos o perfumes.  .    Do not shave 48 hours prior to surgery. Men may shave face and neck.  No se afeite 48  horas antes de la [place].  Los hombres pueden [employer] cara y el cuello.   Do not bring valuables to the hospital.   No lleve objetos de valor al hospital.  [facility] is not responsible for any belongings or valuables.  [place] no se hace responsable de ningn tipo de pertenencias u objetos de Licensed conveyancer.               Contacts, dentures or bridgework may not be worn into surgery.  Los lentes de [place], las dentaduras postizas o puentes no se pueden usar en la [place].  Leave your suitcase in the car. After surgery it may be brought to your room.  Deje su maleta en el auto.  Despus de la ciruga podr traerla a su habitacin.  For patients admitted to the hospital, discharge time is determined by your treatment team.  Para los pacientes que sean ingresados al hospital, el tiempo en el cual se le dar de alta es determinado por su                equipo de [place].   Patients discharged the day of surgery will not be allowed to drive  home. A los pacientes que se les da de alta el mismo da de la ciruga no se les permitir conducir a Higher education careers adviser.   Please read over the following fact sheets that you were given: Por favor lea las siguientes hojas de informacin que le dieron:     __X__ Take these medicines the morning of surgery with A SIP OF WATER:          [place] medicinas la maana de la ciruga con UN SORBO DE AGUA:  1. NONE  2.   3.   4.       5.  6.  ____ Fleet Enema (as directed)          Enema de Fleet (segn lo indicado)    __X__ Use CHG Soap as directed          Utilice el jabn de CHG segn lo indicado  ____ Use inhalers on the day of surgery          Use los inhaladores el da de la ciruga  __X__ Stop metformin 2 days prior to surgery  Monday 04/15/20          Deje de tomar el metformin 2 das antes de la ciruga    ____ Take 1/2 of usual insulin dose the night before surgery and none on the morning of surgery           Tome la mitad de la dosis habitual  de insulina la noche antes de la [place] y no tome nada en la maana de la             ciruga  ____ Stop Coumadin/Plavix/aspirin on           Deje de tomar el Coumadin/Plavix/aspirina el da:  __X__ Stop Anti-inflammatories on NO ADVIL, MOTRIN, IBUPROFEN, ALEVE (NSAIDS)          Deje de tomar antiinflamatorios el da:   __X__ Stop supplements until after surgery            Deje de tomar suplementos hasta despus de la ciruga  ____ Bring C-Pap to the hospital          Lleve el C-Pap al hospital    PLEASE CONTACT YOUR PRIMARY CARE PHYSICIAN REGARDING YOUR CHOLESTEROL AND GASTRITIS MEDICINE    Cmo usar clorhexidina para baarse How to Use Chlorhexidine for Bathing El gluconato de clorhexidina (CHG) es una solucin desinfectante (antisptica) que se utiliza para limpiar la piel. Puede eliminar las bacterias que normalmente viven en la piel y mantenerlas alejadas durante aproximadamente 24 horas. Para limpiarse la piel con CHG, es posible que le den lo siguiente:  Una solucin de CHG para usar en la ducha o como parte de un bao de [place].  Un pao preenvasado que contenga CHG. Limpiar la piel con CHG puede ayudar a disminuir el riesgo de infeccin:  Mientras permanece en la unidad de cuidados intensivos del hospital.  Si tiene un acceso vascular, como una va central, para proporcionar acceso a corto o largo plazo a las venas.  Si tiene un catter para que drene orina de la vejiga.  Si tiene Advertising account planner. El respirador es un aparato que lo ayuda a Industrial/product designer al hacer que el aire entre y salga de sus pulmones.  Despus de [place]. Cules son los riesgos? Los riesgos de usar de CHG incluyen los siguientes:  Reacciones en la piel.  Prdida de la audicin si el CHG ingresa en los odos.  Lesin ocular si  el CHG ingresa en los ojos y no se enjuaga.  El CHG es inflamable. Asegrese de no fumar ni acercarse al fuego luego de aplicarse CHG en la piel. No use CHG:  Si es  alrgico a la clorhexidina o anteriormente tuvo una reaccin a la clorhexidina.  En bebs de menos de 2 meses. Cmo usar la solucin de CHG  Use CHG nicamente como se lo indique su mdico y siga las instrucciones de la etiqueta.  Use la cantidad de CHG que le indicaron. Usualmente, la medida es una botella. Durante una ducha Siga estos pasos al usar la solucin de CHG durante una ducha (a menos que su mdico le brinde instrucciones diferentes): 1. Comience a ducharse. 2. Lvese la cara y el cabello con el jabn y el champ que utiliza habitualmente. 3. Cierre la ducha o salga de abajo del agua. 4. Vierta el CHG en un pao limpio. No use ningn cepillo o esponja con bordes rugosos. 5. Comience en el cuello y colquese la espuma en todo el cuerpo [employer]. Asegrese de seguir estas instrucciones: ? Si le harn una ciruga, preste especial atencin a la parte del cuerpo Dance movement psychotherapist. Frote la zona durante al menos 1 minuto. ? No use el CHG en su cabeza o cara. Si la solucin [employer] odos o los ojos, enjuague bien con [place]. ? Evite la zona genital. ? Evite las zonas de la piel que estn lastimadas o tengan cortes o raspaduras. ? Frote su espalda y [employer]. Asegrese de lavar los pliegues de la piel. 6. Deje actuar la espuma por 1 o 2 minutos, o por el tiempo que le haya indicado el mdico. 7. Enjuguese todo el cuerpo bajo la ducha. Asegrese de enjuagar bien todos los pliegues y hendiduras de la piel. 8. Seque con una toalla limpia. Despus no aplique ninguna sustancia en el cuerpo, como polvo, locin o perfume, excepto que se lo indique el mdico. Solo use las lociones recomendadas por el fabricante. 9. Pngase pijama o ropa limpia. 10. Si es la noche anterior a la ciruga, duerma en sbanas limpias. 11. REPEAT THE MORNING OF SURGERY

## 2020-04-09 NOTE — Telephone Encounter (Signed)
Connie Russell with [facility] will call as patient is spanish speaking.  Please advise patient of Pre-Admission date/time, COVID Testing date and Surgery date.  Surgery Date: 04/17/20 Preadmission Testing Date: 04/09/20 (phone 1p-5p) Covid Testing Date: 04/16/20 - [facility] [facility]) between 8a-1p   Also please inform patient to call [phone], between 1-3:00pm the day before surgery, to find out what time to arrive for surgery.

## 2020-04-09 NOTE — Pre-Procedure Instructions (Signed)
Spoke to Connie Russell at PCP office in regards to patient needing more education on her medications. Patient is not taking cholesterol or gastritis meds at present time. Message to be forwarded to patients PCP per Connie Russell. I had also encouraged patient to  contact her PCP regarding her medications.

## 2020-04-09 NOTE — Telephone Encounter (Signed)
Will forward to pcp

## 2020-04-09 NOTE — Pre-Procedure Instructions (Signed)
Medical clearance requested by Dr Aleen Campi. Secure chat  for copy of clearance once available to  [facility] CMA.

## 2020-04-09 NOTE — Telephone Encounter (Signed)
Copied from CRM [id number]. Topic: General - Other >> Apr 09, 2020 12:17 PM Jaquita Rector A wrote: Reason for CRM: Karl Ito RN at [facility] preadmittance called to inform Dr Laural Benes that from speaking with patient she is not following her medication regimen and could benefit from some more education. Also staes that patient is not taking her atorvastatin (LIPITOR) 10 MG tablet  or medication that was given to her for Gastritis. Patient is scheduled for hernia surgery on 04/17/20 and for for clearance will be sent over to be completed and returned. Please advise

## 2020-04-10 MED FILL — ?ATORVASTATIN 10 MG TABLET: 10 | 30 days supply | Qty: 30 | Fill #0

## 2020-04-11 NOTE — Telephone Encounter (Signed)
I was able to call language services, spoke with Adeline, ID# [phone] and she was able to interpret dates regarding patient's surgery and patient voices understanding through language services.

## 2020-04-12 ENCOUNTER — Encounter: Payer: Self-pay | Admitting: Internal Medicine

## 2020-04-12 ENCOUNTER — Other Ambulatory Visit: Payer: Self-pay

## 2020-04-12 ENCOUNTER — Ambulatory Visit: Payer: Self-pay | Attending: Family Medicine | Admitting: Internal Medicine

## 2020-04-12 VITALS — BP 146/82 | HR 84 | Wt 148.0 lb

## 2020-04-12 DIAGNOSIS — Z7982 Long term (current) use of aspirin: Secondary | ICD-10-CM | POA: Insufficient documentation

## 2020-04-12 DIAGNOSIS — K219 Gastro-esophageal reflux disease without esophagitis: Secondary | ICD-10-CM | POA: Insufficient documentation

## 2020-04-12 DIAGNOSIS — H8110 Benign paroxysmal vertigo, unspecified ear: Secondary | ICD-10-CM | POA: Insufficient documentation

## 2020-04-12 DIAGNOSIS — E785 Hyperlipidemia, unspecified: Secondary | ICD-10-CM | POA: Insufficient documentation

## 2020-04-12 DIAGNOSIS — Z01818 Encounter for other preprocedural examination: Secondary | ICD-10-CM | POA: Insufficient documentation

## 2020-04-12 DIAGNOSIS — Z833 Family history of diabetes mellitus: Secondary | ICD-10-CM | POA: Insufficient documentation

## 2020-04-12 DIAGNOSIS — E119 Type 2 diabetes mellitus without complications: Secondary | ICD-10-CM

## 2020-04-12 DIAGNOSIS — Z79899 Other long term (current) drug therapy: Secondary | ICD-10-CM | POA: Insufficient documentation

## 2020-04-12 DIAGNOSIS — Z888 Allergy status to other drugs, medicaments and biological substances status: Secondary | ICD-10-CM | POA: Insufficient documentation

## 2020-04-12 DIAGNOSIS — I10 Essential (primary) hypertension: Secondary | ICD-10-CM | POA: Insufficient documentation

## 2020-04-12 DIAGNOSIS — Z23 Encounter for immunization: Secondary | ICD-10-CM

## 2020-04-12 DIAGNOSIS — E559 Vitamin D deficiency, unspecified: Secondary | ICD-10-CM | POA: Insufficient documentation

## 2020-04-12 DIAGNOSIS — Z8249 Family history of ischemic heart disease and other diseases of the circulatory system: Secondary | ICD-10-CM | POA: Insufficient documentation

## 2020-04-12 DIAGNOSIS — K5900 Constipation, unspecified: Secondary | ICD-10-CM | POA: Insufficient documentation

## 2020-04-12 DIAGNOSIS — E1169 Type 2 diabetes mellitus with other specified complication: Secondary | ICD-10-CM | POA: Insufficient documentation

## 2020-04-12 DIAGNOSIS — Z7984 Long term (current) use of oral hypoglycemic drugs: Secondary | ICD-10-CM | POA: Insufficient documentation

## 2020-04-12 MED ORDER — LOSARTAN POTASSIUM 50 MG PO TABS: 50.0000 mg | ORAL_TABLET | Freq: Every day | ORAL | 6 refills | Status: DC

## 2020-04-12 NOTE — Progress Notes (Signed)
Patient ID: Connie Russell, female    DOB: 01/18/1971  MRN: [id number]  CC: Pre-op Exam   Subjective: Connie Russell is a 49 y.o. female who presents for pre-operative eval. Her concerns today include:  Patient with history of diabetes type 2, HTN, HL, constipation, vitamin D deficiency  Patient scheduled for umbilical hernia repair on the eighth of this month. No previous surgeries under general anesthesia in past. Not very active outside of work but reports she does a lot of walking at work.  Works at [employer]. SOB only if she tries to run.  Otherwise can walk 3-4 blocks at a rapid pace without SOB or CP.  -she takes ASA daily.  She stopped it yesterday in preparation for surgery.  -Reports compliance with Metformin.  Checks BS 3-4 times a wk.  In mornings range is in the 130s.  Most recent A1c done 1 month ago was 7 -Reports compliant with Losartan and took already for the morning.  -Out of Lipitor x 3 wks.  Picked up refill yesterday Patient Active Problem List   Diagnosis Date Noted  . Benign paroxysmal positional vertigo 06/04/2017  . Diabetes mellitus type 2, controlled ([place]) 03/18/2016  . Gastritis 09/16/2015  . Dyspepsia 06/13/2015  . Constipation 01/15/2014  . GERD (gastroesophageal reflux disease) 01/15/2014  . Hypertension, essential 08/14/2013  . Other and unspecified hyperlipidemia 08/14/2013  . Preventative health care 07/14/2013     Current Outpatient Medications on File Prior to Visit  Medication Sig Dispense Refill  . Ascorbic Acid (VITAMIN C) 100 MG tablet Take 100 mg by mouth daily.    Marland Kitchen atorvastatin (LIPITOR) 10 MG tablet Take 1 tablet (10 mg total) by mouth daily. 30 tablet 3  . Blood Glucose Monitoring Suppl (TRUE METRIX METER) w/Device KIT Use as directed 1 kit 0  . glucose blood (TRUE METRIX BLOOD GLUCOSE TEST) test strip Use as instructed 100 each 12  . metFORMIN (GLUCOPHAGE) 1000 MG tablet Take 1 tablet (1,000 mg total) by mouth 2  (two) times daily with a meal. 60 tablet 3  . [place] Oleifera (MORINGA PO) Take 1 tablet by mouth daily.    . norethindrone (JENCYCLA) 0.35 MG tablet Take 1 tablet by mouth daily.    . sitaGLIPtin (JANUVIA) 50 MG tablet Take 1 tablet (50 mg total) by mouth daily. 30 tablet 2  . TRUEplus Lancets 28G MISC Use as directed 100 each 6  . Vitamin D, Cholecalciferol, 400 units CAPS Take 400 Units by mouth every morning. 90 capsule 0  . aspirin EC 81 MG tablet Take 1 tablet (81 mg total) by mouth daily. (Patient not taking: Reported on 04/12/2020) 30 tablet 11   No current facility-administered medications on file prior to visit.    Allergies  Allergen Reactions  . Linzess [Linaclotide] Diarrhea    DIARRHEA AT 145 MICROG    Social History   Socioeconomic History  . Marital status: Single    Spouse name: Not on file  . Number of children: Not on file  . Years of education: Not on file  . Highest education level: Not on file  Occupational History  . Not on file  Tobacco Use  . Smoking status: Never Smoker  . Smokeless tobacco: Never Used  . Tobacco comment: Never smoked  Vaping Use  . Vaping Use: Never used  Substance and Sexual Activity  . Alcohol use: No    Alcohol/week: 0.0 standard drinks  . Drug use: No  . Sexual activity: Yes  Birth control/protection: Pill, None  Other Topics Concern  . Not on file  Social History Narrative   ** Merged History Encounter **       WORK FOR [employer] IN [place].   Social Determinants of Health   Financial Resource Strain:   . Difficulty of Paying Living Expenses: Not on file  Food Insecurity:   . Worried About Charity fundraiser in the Last Year: Not on file  . Ran Out of Food in the Last Year: Not on file  Transportation Needs:   . Lack of Transportation (Medical): Not on file  . Lack of Transportation (Non-Medical): Not on file  Physical Activity:   . Days of Exercise per Week: Not on file  . Minutes of Exercise per Session: Not on  file  Stress:   . Feeling of Stress : Not on file  Social Connections:   . Frequency of Communication with Friends and Family: Not on file  . Frequency of Social Gatherings with Friends and Family: Not on file  . Attends Religious Services: Not on file  . Active Member of Clubs or Organizations: Not on file  . Attends Archivist Meetings: Not on file  . Marital Status: Not on file  Intimate Partner Violence:   . Fear of Current or Ex-Partner: Not on file  . Emotionally Abused: Not on file  . Physically Abused: Not on file  . Sexually Abused: Not on file    Family History  Problem Relation Age of Onset  . Hypertension Mother   . Arthritis Mother   . Diabetes Maternal Grandmother   . Colon cancer Neg Hx     Past Surgical History:  Procedure Laterality Date  . ESOPHAGOGASTRODUODENOSCOPY N/A 06/24/2015   SLF: 1. Abdominal pain most likely due to constipation, GERD, and Gastritis. 2. MIld NON-erosive gastritis.  Marland Kitchen FOOT SURGERY Left 2000   Local  . PLANTAR'S WART EXCISION      ROS: Review of Systems Negative except as stated above  PHYSICAL EXAM: BP (!) 146/82   Pulse 84   Wt 148 lb (67.1 kg)   LMP 04/05/2020 (Exact Date)   SpO2 99%   BMI 25.40 kg/m   Physical Exam  General appearance - alert, well appearing, and in no distress Mental status - normal mood, behavior, speech, dress, motor activity, and thought processes Eyes - pupils equal and reactive, extraocular eye movements intact Nose - normal and patent, no erythema, discharge or polyps Mouth - mucous membranes moist, pharynx normal without lesions Neck - supple, no significant adenopathy Lymphatics - no palpable lymphadenopathy, no hepatosplenomegaly Chest - clear to auscultation, no wheezes, rales or rhonchi, symmetric air entry Heart - normal rate, regular rhythm, normal S1, S2, no murmurs, rubs, clicks or gallops Extremities - peripheral pulses normal, no pedal edema, no clubbing or  cyanosis Diabetic Foot Exam - Simple   Simple Foot Form Visual Inspection No deformities, no ulcerations, no other skin breakdown bilaterally: Yes Sensation Testing Intact to touch and monofilament testing bilaterally: Yes Pulse Check Posterior Tibialis and Dorsalis pulse intact bilaterally: Yes Comments     Lab Results  Component Value Date   HGBA1C 7.0 (H) 03/06/2020    CMP Latest Ref Rng & Units 10/20/2019 02/02/2019 03/10/2018  Glucose 65 - 99 mg/dL 115(H) 148(H) 217(H)  BUN 6 - 24 mg/dL 12 11 13   Creatinine 0.57 - 1.00 mg/dL 0.56(L) 0.68 0.65  Sodium 134 - 144 mmol/L 138 140 134  Potassium 3.5 - 5.2 mmol/L  4.8 4.7 4.1  Chloride 96 - 106 mmol/L 99 101 98  CO2 20 - 29 mmol/L 23 23 23   Calcium 8.7 - 10.2 mg/dL 9.3 9.8 9.4  Total Protein 6.0 - 8.5 g/dL 7.0 6.9 -  Total Bilirubin 0.0 - 1.2 mg/dL 0.4 0.5 -  Alkaline Phos 39 - 117 IU/L 61 61 -  AST 0 - 40 IU/L 15 22 -  ALT 0 - 32 IU/L 16 24 -   Lipid Panel     Component Value Date/Time   CHOL 207 (H) 10/20/2019 0944   TRIG 175 (H) 10/20/2019 0944   HDL 55 10/20/2019 0944   CHOLHDL 3.8 10/20/2019 0944   CHOLHDL 4.3 09/09/2015 0942   VLDL 23 09/09/2015 0942   LDLCALC 121 (H) 10/20/2019 0944    CBC    Component Value Date/Time   WBC 9.0 10/20/2019 0944   WBC 5.8 03/04/2017 1244   RBC 4.88 10/20/2019 0944   RBC 4.40 03/04/2017 1244   HGB 15.6 10/20/2019 0944   HCT 44.3 10/20/2019 0944   PLT 317 10/20/2019 0944   MCV 91 10/20/2019 0944   MCH 32.0 10/20/2019 0944   MCH 30.9 03/04/2017 1244   MCHC 35.2 10/20/2019 0944   MCHC 35.3 03/04/2017 1244   RDW 12.0 10/20/2019 0944   LYMPHSABS 3.6 (H) 10/20/2019 0944   MONOABS 0.2 03/04/2017 1244   EOSABS 0.2 10/20/2019 0944   BASOSABS 0.1 10/20/2019 0944   EKG: Normal sinus rhythm.  No acute changes.  Unchanged from EKG done in 2017.  ASSESSMENT AND PLAN: 1. Preoperative evaluation to rule out surgical contraindication Patient at low to moderate risk but optimized for  surgery.  She has already started holding the aspirin.  We can move forward with surgery. - CBC - Comprehensive metabolic panel  2. Type 2 diabetes mellitus without complication, without long-term current use of insulin ([place]) Close to goal based on A1c.  Continue current dose of Metformin, healthy eating habits and encourage regular exercise  3. Essential hypertension Systolic blood pressure not at goal.  We will increase the Cozaar to 50 mg daily. - losartan (COZAAR) 50 MG tablet; Take 1 tablet (50 mg total) by mouth daily.  Dispense: 30 tablet; Refill: 6  4. Hyperlipidemia associated with type 2 diabetes mellitus (HCC) Continue atorvastatin  5. Need for influenza vaccination Given today by CMA [place].    Patient was given the opportunity to ask questions.  Patient verbalized understanding of the plan and was able to repeat key elements of the plan.  [employer] interpreter used during this encounter. #[id number]  Orders Placed This Encounter  Procedures  . CBC  . Comprehensive metabolic panel     Requested Prescriptions   Signed Prescriptions Disp Refills  . losartan (COZAAR) 50 MG tablet 30 tablet 6    Sig: Take 1 tablet (50 mg total) by mouth daily.    Return in about 4 months (around 08/12/2020).  Karle Plumber, MD, FACP

## 2020-04-12 NOTE — Progress Notes (Signed)
Here today for pre op.

## 2020-04-13 LAB — CBC
Hematocrit: 44.6 % (ref 34.0–46.6)
Hemoglobin: 15.1 g/dL (ref 11.1–15.9)
MCH: 31.7 pg (ref 26.6–33.0)
MCHC: 33.9 g/dL (ref 31.5–35.7)
MCV: 94 fL (ref 79–97)
Platelets: 307 10*3/uL (ref 150–450)
RBC: 4.77 x10E6/uL (ref 3.77–5.28)
RDW: 11.9 % (ref 11.7–15.4)
WBC: 10 10*3/uL (ref 3.4–10.8)

## 2020-04-13 LAB — COMPREHENSIVE METABOLIC PANEL
ALT: 23 IU/L (ref 0–32)
AST: 14 IU/L (ref 0–40)
Albumin/Globulin Ratio: 1.6 (ref 1.2–2.2)
Albumin: 4.3 g/dL (ref 3.8–4.8)
Alkaline Phosphatase: 73 IU/L (ref 48–121)
BUN/Creatinine Ratio: 20 (ref 9–23)
BUN: 11 mg/dL (ref 6–24)
Bilirubin Total: 0.3 mg/dL (ref 0.0–1.2)
CO2: 25 mmol/L (ref 20–29)
Calcium: 9.5 mg/dL (ref 8.7–10.2)
Chloride: 100 mmol/L (ref 96–106)
Creatinine, Ser: 0.56 mg/dL — ABNORMAL LOW (ref 0.57–1.00)
GFR calc Af Amer: 128 mL/min/{1.73_m2} (ref >59)
GFR calc non Af Amer: 111 mL/min/{1.73_m2} (ref >59)
Globulin, Total: 2.7 g/dL (ref 1.5–4.5)
Glucose: 161 mg/dL — ABNORMAL HIGH (ref 65–99)
Potassium: 4.7 mmol/L (ref 3.5–5.2)
Sodium: 138 mmol/L (ref 134–144)
Total Protein: 7 g/dL (ref 6.0–8.5)

## 2020-04-13 NOTE — Progress Notes (Signed)
Let pt know that her blood count, kidney and liver function tests are normal.

## 2020-04-16 ENCOUNTER — Other Ambulatory Visit: Payer: Self-pay

## 2020-04-16 ENCOUNTER — Telehealth: Payer: Self-pay | Admitting: Emergency Medicine

## 2020-04-16 ENCOUNTER — Telehealth: Payer: Self-pay

## 2020-04-16 ENCOUNTER — Other Ambulatory Visit
Admission: RE | Admit: 2020-04-16 | Discharge: 2020-04-16 | Disposition: A | Discharge status: Home / Self Care | Payer: HRSA Program | Source: Ambulatory Visit | Attending: Surgery | Admitting: Surgery

## 2020-04-16 DIAGNOSIS — Z01812 Encounter for preprocedural laboratory examination: Secondary | ICD-10-CM | POA: Diagnosis present

## 2020-04-16 DIAGNOSIS — Z20822 Contact with and (suspected) exposure to covid-19: Secondary | ICD-10-CM | POA: Diagnosis not present

## 2020-04-16 LAB — SARS CORONAVIRUS 2 (TAT 6-24 HRS): SARS Coronavirus 2: NEGATIVE

## 2020-04-16 MED ORDER — PROPOFOL 500 MG/50ML IV EMUL
INTRAVENOUS | Status: AC
  Filled 2020-04-16: qty 200

## 2020-04-16 NOTE — Telephone Encounter (Signed)
[employer] interpreters Carlos  Id#  [id number] contacted pt to go over lab results pt is aware and doesn't have any questions or concerns   

## 2020-04-16 NOTE — Telephone Encounter (Signed)
Medical clearance received from Dr Laural Benes.   "Patient at low to moderate risk but optimized for surgery.  She has already started holding the aspirin.  We can move forward with surgery."  Notes in Epic from encounter note 04/12/20

## 2020-04-17 ENCOUNTER — Other Ambulatory Visit: Payer: Self-pay

## 2020-04-17 ENCOUNTER — Ambulatory Visit: Payer: No Typology Code available for payment source | Admitting: Anesthesiology

## 2020-04-17 ENCOUNTER — Other Ambulatory Visit: Payer: Self-pay | Admitting: Surgery

## 2020-04-17 ENCOUNTER — Encounter: Payer: Self-pay | Admitting: Surgery

## 2020-04-17 ENCOUNTER — Telehealth: Payer: Self-pay | Admitting: *Deleted

## 2020-04-17 ENCOUNTER — Ambulatory Visit
Admission: RE | Admit: 2020-04-17 | Discharge: 2020-04-17 | Disposition: A | Discharge status: Home / Self Care | Payer: No Typology Code available for payment source | Attending: Surgery | Admitting: Surgery

## 2020-04-17 ENCOUNTER — Encounter
Admission: RE | Disposition: A | Discharge status: Home / Self Care | Payer: Self-pay | Source: Home / Self Care | Attending: Surgery

## 2020-04-17 DIAGNOSIS — Z79899 Other long term (current) drug therapy: Secondary | ICD-10-CM | POA: Insufficient documentation

## 2020-04-17 DIAGNOSIS — E119 Type 2 diabetes mellitus without complications: Secondary | ICD-10-CM | POA: Insufficient documentation

## 2020-04-17 DIAGNOSIS — Z7982 Long term (current) use of aspirin: Secondary | ICD-10-CM | POA: Insufficient documentation

## 2020-04-17 DIAGNOSIS — K429 Umbilical hernia without obstruction or gangrene: Secondary | ICD-10-CM

## 2020-04-17 DIAGNOSIS — Z7984 Long term (current) use of oral hypoglycemic drugs: Secondary | ICD-10-CM | POA: Insufficient documentation

## 2020-04-17 DIAGNOSIS — I10 Essential (primary) hypertension: Secondary | ICD-10-CM | POA: Insufficient documentation

## 2020-04-17 DIAGNOSIS — K219 Gastro-esophageal reflux disease without esophagitis: Secondary | ICD-10-CM | POA: Insufficient documentation

## 2020-04-17 DIAGNOSIS — E785 Hyperlipidemia, unspecified: Secondary | ICD-10-CM | POA: Insufficient documentation

## 2020-04-17 HISTORY — PX: UMBILICAL HERNIA REPAIR: SHX196

## 2020-04-17 LAB — GLUCOSE, CAPILLARY
Glucose-Capillary: 128 mg/dL — ABNORMAL HIGH (ref 70–99)
Glucose-Capillary: 185 mg/dL — ABNORMAL HIGH (ref 70–99)

## 2020-04-17 LAB — POCT PREGNANCY, URINE: Preg Test, Ur: NEGATIVE

## 2020-04-17 SURGERY — REPAIR, HERNIA, UMBILICAL, ADULT
Anesthesia: General

## 2020-04-17 MED ORDER — PROPOFOL 10 MG/ML IV BOLUS
INTRAVENOUS | Status: AC
  Filled 2020-04-17: qty 20

## 2020-04-17 MED ORDER — OXYCODONE HCL 5 MG PO TABS: 5.0000 mg | ORAL_TABLET | ORAL | 0 refills | Status: DC | PRN

## 2020-04-17 MED ORDER — GABAPENTIN 300 MG PO CAPS: 300.0000 mg | ORAL_CAPSULE | ORAL | Status: AC

## 2020-04-17 MED ORDER — MIDAZOLAM HCL 2 MG/2ML IJ SOLN
INTRAMUSCULAR | Status: AC
  Filled 2020-04-17: qty 2

## 2020-04-17 MED ORDER — EPHEDRINE 5 MG/ML INJ
INTRAVENOUS | Status: AC
  Filled 2020-04-17: qty 10

## 2020-04-17 MED ORDER — ROCURONIUM BROMIDE 100 MG/10ML IV SOLN
INTRAVENOUS | Status: DC | PRN
  Administered 2020-04-17: 40 mg via INTRAVENOUS

## 2020-04-17 MED ORDER — CHLORHEXIDINE GLUCONATE 0.12 % MT SOLN: 15.0000 mL | Freq: Once | OROMUCOSAL | Status: AC

## 2020-04-17 MED ORDER — OXYCODONE HCL 5 MG PO TABS
5.0000 mg | ORAL_TABLET | Freq: Once | ORAL | Status: AC | PRN
  Administered 2020-04-17: 5 mg via ORAL

## 2020-04-17 MED ORDER — ONDANSETRON HCL 4 MG/2ML IJ SOLN
INTRAMUSCULAR | Status: DC | PRN
  Administered 2020-04-17: 4 mg via INTRAVENOUS

## 2020-04-17 MED ORDER — CHLORHEXIDINE GLUCONATE CLOTH 2 % EX PADS
6.0000 | MEDICATED_PAD | Freq: Once | CUTANEOUS | Status: AC
  Administered 2020-04-17: 6 via TOPICAL

## 2020-04-17 MED ORDER — FENTANYL CITRATE (PF) 100 MCG/2ML IJ SOLN
INTRAMUSCULAR | Status: AC
  Filled 2020-04-17: qty 2

## 2020-04-17 MED ORDER — PROPOFOL 10 MG/ML IV BOLUS
INTRAVENOUS | Status: DC | PRN
  Administered 2020-04-17: 130 mg via INTRAVENOUS

## 2020-04-17 MED ORDER — DEXMEDETOMIDINE (PRECEDEX) IN NS 20 MCG/5ML (4 MCG/ML) IV SYRINGE
PREFILLED_SYRINGE | INTRAVENOUS | Status: AC
  Filled 2020-04-17: qty 5

## 2020-04-17 MED ORDER — BUPIVACAINE-EPINEPHRINE (PF) 0.5% -1:200000 IJ SOLN
INTRAMUSCULAR | Status: DC | PRN
  Administered 2020-04-17: 30 mL

## 2020-04-17 MED ORDER — GABAPENTIN 300 MG PO CAPS
ORAL_CAPSULE | ORAL | Status: AC
  Administered 2020-04-17: 300 mg via ORAL
  Filled 2020-04-17: qty 1

## 2020-04-17 MED ORDER — ACETAMINOPHEN 500 MG PO TABS
ORAL_TABLET | ORAL | Status: AC
  Administered 2020-04-17: 1000 mg via ORAL
  Filled 2020-04-17: qty 2

## 2020-04-17 MED ORDER — SUGAMMADEX SODIUM 200 MG/2ML IV SOLN
INTRAVENOUS | Status: DC | PRN
  Administered 2020-04-17: 200 mg via INTRAVENOUS

## 2020-04-17 MED ORDER — KETOROLAC TROMETHAMINE 30 MG/ML IJ SOLN
INTRAMUSCULAR | Status: DC | PRN
  Administered 2020-04-17: 30 mg via INTRAVENOUS

## 2020-04-17 MED ORDER — ORAL CARE MOUTH RINSE: 15.0000 mL | Freq: Once | OROMUCOSAL | Status: AC

## 2020-04-17 MED ORDER — KETOROLAC TROMETHAMINE 30 MG/ML IJ SOLN
INTRAMUSCULAR | Status: AC
  Filled 2020-04-17: qty 1

## 2020-04-17 MED ORDER — FENTANYL CITRATE (PF) 100 MCG/2ML IJ SOLN
INTRAMUSCULAR | Status: DC | PRN
  Administered 2020-04-17 (×2): 50 ug via INTRAVENOUS

## 2020-04-17 MED ORDER — EPHEDRINE SULFATE 50 MG/ML IJ SOLN
INTRAMUSCULAR | Status: DC | PRN
  Administered 2020-04-17: 10 mg via INTRAVENOUS

## 2020-04-17 MED ORDER — ROCURONIUM BROMIDE 10 MG/ML (PF) SYRINGE
PREFILLED_SYRINGE | INTRAVENOUS | Status: AC
  Filled 2020-04-17: qty 10

## 2020-04-17 MED ORDER — CEFAZOLIN SODIUM-DEXTROSE 2-4 GM/100ML-% IV SOLN
2.0000 g | INTRAVENOUS | Status: AC
  Administered 2020-04-17: 2 g via INTRAVENOUS

## 2020-04-17 MED ORDER — FAMOTIDINE 20 MG PO TABS
ORAL_TABLET | ORAL | Status: AC
  Administered 2020-04-17: 20 mg
  Filled 2020-04-17: qty 1

## 2020-04-17 MED ORDER — LIDOCAINE HCL (CARDIAC) PF 100 MG/5ML IV SOSY
PREFILLED_SYRINGE | INTRAVENOUS | Status: DC | PRN
  Administered 2020-04-17: 60 mg via INTRAVENOUS

## 2020-04-17 MED ORDER — MIDAZOLAM HCL 2 MG/2ML IJ SOLN
INTRAMUSCULAR | Status: DC | PRN
  Administered 2020-04-17: 2 mg via INTRAVENOUS

## 2020-04-17 MED ORDER — ACETAMINOPHEN 500 MG PO TABS: 1000.0000 mg | ORAL_TABLET | ORAL | Status: AC

## 2020-04-17 MED ORDER — DEXAMETHASONE SODIUM PHOSPHATE 10 MG/ML IJ SOLN
INTRAMUSCULAR | Status: DC | PRN
  Administered 2020-04-17: 10 mg via INTRAVENOUS

## 2020-04-17 MED ORDER — SODIUM CHLORIDE 0.9 % IV SOLN: INTRAVENOUS | Status: DC

## 2020-04-17 MED ORDER — IBUPROFEN 600 MG PO TABS: 600.0000 mg | ORAL_TABLET | Freq: Three times a day (TID) | ORAL | 1 refills | Status: DC | PRN

## 2020-04-17 MED ORDER — ONDANSETRON HCL 4 MG/2ML IJ SOLN
INTRAMUSCULAR | Status: AC
  Filled 2020-04-17: qty 2

## 2020-04-17 MED ORDER — CHLORHEXIDINE GLUCONATE CLOTH 2 % EX PADS: 6.0000 | MEDICATED_PAD | Freq: Once | CUTANEOUS | Status: DC

## 2020-04-17 MED ORDER — CEFAZOLIN SODIUM-DEXTROSE 2-4 GM/100ML-% IV SOLN
INTRAVENOUS | Status: AC
  Filled 2020-04-17: qty 100

## 2020-04-17 MED ORDER — PROMETHAZINE HCL 25 MG/ML IJ SOLN: 6.2500 mg | INTRAMUSCULAR | Status: DC | PRN

## 2020-04-17 MED ORDER — FENTANYL CITRATE (PF) 100 MCG/2ML IJ SOLN: 25.0000 ug | INTRAMUSCULAR | Status: DC | PRN

## 2020-04-17 MED ORDER — MEPERIDINE HCL 50 MG/ML IJ SOLN: 6.2500 mg | INTRAMUSCULAR | Status: DC | PRN

## 2020-04-17 MED ORDER — OXYCODONE HCL 5 MG PO TABS
5.0000 mg | ORAL_TABLET | ORAL | 0 refills | Status: DC | PRN
Start: 2020-04-17 — End: 2020-05-01

## 2020-04-17 MED ORDER — CHLORHEXIDINE GLUCONATE 0.12 % MT SOLN
OROMUCOSAL | Status: AC
  Administered 2020-04-17: 15 mL via OROMUCOSAL
  Filled 2020-04-17: qty 15

## 2020-04-17 MED ORDER — DEXAMETHASONE SODIUM PHOSPHATE 10 MG/ML IJ SOLN
INTRAMUSCULAR | Status: AC
  Filled 2020-04-17: qty 1

## 2020-04-17 MED ORDER — OXYCODONE HCL 5 MG/5ML PO SOLN: 5.0000 mg | Freq: Once | ORAL | Status: AC | PRN

## 2020-04-17 MED ORDER — BUPIVACAINE-EPINEPHRINE (PF) 0.5% -1:200000 IJ SOLN
INTRAMUSCULAR | Status: AC
  Filled 2020-04-17: qty 30

## 2020-04-17 MED ORDER — OXYCODONE HCL 5 MG PO TABS
ORAL_TABLET | ORAL | Status: AC
  Filled 2020-04-17: qty 1

## 2020-04-17 MED ORDER — LIDOCAINE HCL (PF) 2 % IJ SOLN
INTRAMUSCULAR | Status: AC
  Filled 2020-04-17: qty 5

## 2020-04-17 MED ORDER — DEXMEDETOMIDINE HCL 200 MCG/2ML IV SOLN
INTRAVENOUS | Status: DC | PRN
  Administered 2020-04-17: 4 ug via INTRAVENOUS

## 2020-04-17 MED FILL — IBUPROFEN 600 MG TABLET: 600 | 20 days supply | Qty: 60 | Fill #0

## 2020-04-17 SURGICAL SUPPLY — 32 items
ADH SKN CLS APL DERMABOND .7 (GAUZE/BANDAGES/DRESSINGS) ×1
APL PRP STRL LF DISP 70% ISPRP (MISCELLANEOUS) ×1
BLADE CLIPPER SURG (BLADE) ×3 IMPLANT
BLADE SURG 15 STRL LF DISP TIS (BLADE) ×1 IMPLANT
BLADE SURG 15 STRL SS (BLADE) ×3
CANISTER SUCT 1200ML W/VALVE (MISCELLANEOUS) IMPLANT
CHLORAPREP W/TINT 26 (MISCELLANEOUS) ×3 IMPLANT
COVER WAND RF STERILE (DRAPES) ×3 IMPLANT
DERMABOND ADVANCED (GAUZE/BANDAGES/DRESSINGS) ×2
DERMABOND ADVANCED .7 DNX12 (GAUZE/BANDAGES/DRESSINGS) ×1 IMPLANT
DRAPE LAPAROTOMY 77X122 PED (DRAPES) ×3 IMPLANT
ELECT REM PT RETURN 9FT ADLT (ELECTROSURGICAL) ×3
ELECTRODE REM PT RTRN 9FT ADLT (ELECTROSURGICAL) ×1 IMPLANT
GLOVE SURG SYN 7.0 (GLOVE) ×3 IMPLANT
GLOVE SURG SYN 7.5  E (GLOVE) ×2
GLOVE SURG SYN 7.5 E (GLOVE) ×1 IMPLANT
GOWN STRL REUS W/ TWL LRG LVL3 (GOWN DISPOSABLE) ×3 IMPLANT
GOWN STRL REUS W/TWL LRG LVL3 (GOWN DISPOSABLE) ×9
MANIFOLD NEPTUNE II (INSTRUMENTS) ×3 IMPLANT
MESH VENTRALEX ST 8CM LRG (Mesh General) ×3 IMPLANT
NEEDLE HYPO 22GX1.5 SAFETY (NEEDLE) ×3 IMPLANT
NS IRRIG 500ML POUR BTL (IV SOLUTION) ×3 IMPLANT
PACK BASIN MINOR (MISCELLANEOUS) ×3 IMPLANT
SUT ETHIBOND 0 MO6 C/R (SUTURE) ×3 IMPLANT
SUT MNCRL AB 4-0 PS2 18 (SUTURE) ×3 IMPLANT
SUT PROLENE 2 0 SH DA (SUTURE) ×3 IMPLANT
SUT VIC AB 2-0 SH 27 (SUTURE) ×3
SUT VIC AB 2-0 SH 27XBRD (SUTURE) ×1 IMPLANT
SUT VIC AB 3-0 SH 27 (SUTURE) ×3
SUT VIC AB 3-0 SH 27X BRD (SUTURE) ×1 IMPLANT
SYR 20ML LL LF (SYRINGE) ×3 IMPLANT
SYR BULB IRRIG 60ML STRL (SYRINGE) ×3 IMPLANT

## 2020-04-17 NOTE — Anesthesia Postprocedure Evaluation (Signed)
Anesthesia Post Note  Patient: Connie Russell  Procedure(s) Performed: HERNIA REPAIR UMBILICAL ADULT, Open WITH MESH (N/A )  Patient location during evaluation: PACU Anesthesia Type: General Level of consciousness: awake and alert and oriented Pain management: pain level controlled Vital Signs Assessment: post-procedure vital signs reviewed and stable Respiratory status: spontaneous breathing, nonlabored ventilation and respiratory function stable Cardiovascular status: blood pressure returned to baseline and stable Postop Assessment: no signs of nausea or vomiting Anesthetic complications: no   No complications documented.   Last Vitals:  Vitals:   04/17/20 1111 04/17/20 1126  BP: 126/76 122/78  Pulse: 85 85  Resp: 17 18  Temp:    SpO2: 98% 98%    Last Pain:  Vitals:   04/17/20 1126  TempSrc:   PainSc: 0-No pain                 Shavon Zenz

## 2020-04-17 NOTE — Discharge Instructions (Signed)
AMBULATORY SURGERY  DISCHARGE INSTRUCTIONS   1) The drugs that you were given will stay in your system until tomorrow so for the next 24 hours you should not:  A) Drive an automobile B) Make any legal decisions C) Drink any alcoholic beverage   2) You may resume regular meals tomorrow.  Today it is better to start with liquids and gradually work up to solid foods.  You may eat anything you prefer, but it is better to start with liquids, then soup and crackers, and gradually work up to solid foods.   3) Please notify your doctor immediately if you have any unusual bleeding, trouble breathing, redness and pain at the surgery site, drainage, fever, or pain not relieved by medication. 4)   Zach 5) Your post-operative visit with Dr.           Ian Malkin PA                         is: Date:                        Time:    Please call to schedule your post-operative visit.  6) Additional Instructions:

## 2020-04-17 NOTE — Anesthesia Procedure Notes (Signed)
Procedure Name: Intubation Date/Time: 04/17/2020 9:12 AM Performed by: Caryl Asp, CRNA Pre-anesthesia Checklist: Patient identified, Patient being monitored, Timeout performed, Emergency Drugs available and Suction available Patient Re-evaluated:Patient Re-evaluated prior to induction Oxygen Delivery Method: Circle system utilized Preoxygenation: Pre-oxygenation with 100% oxygen Induction Type: IV induction Ventilation: Mask ventilation without difficulty Laryngoscope Size: Mac and 3 Grade View: Grade I Tube type: Oral Tube size: 7.0 mm Number of attempts: 1 Airway Equipment and Method: Stylet Placement Confirmation: ETT inserted through vocal cords under direct vision,  positive ETCO2 and breath sounds checked- equal and bilateral Secured at: 21 cm Tube secured with: Tape Dental Injury: Teeth and Oropharynx as per pre-operative assessment

## 2020-04-17 NOTE — Interval H&P Note (Signed)
History and Physical Interval Note:  04/17/2020 8:20 AM  Connie Russell  has presented today for surgery, with the diagnosis of Umbilical hernia.  The various methods of treatment have been discussed with the patient and family. After consideration of risks, benefits and other options for treatment, the patient has consented to  Procedure(s): HERNIA REPAIR UMBILICAL ADULT, Open (N/A) as a surgical intervention.  The patient's history has been reviewed, patient examined, no change in status, stable for surgery.  I have reviewed the patient's chart and labs.  Questions were answered to the patient's satisfaction.     Jr Milliron

## 2020-04-17 NOTE — Telephone Encounter (Signed)
Patient had surgery today by Dr Aleen Campi, umbilical hernia and patient called and stated that her pain medication oxycodone was called into [facility] and they do not fill that. She wants it sent in to [facility] [place]. Please call and advise

## 2020-04-17 NOTE — Anesthesia Preprocedure Evaluation (Signed)
Anesthesia Evaluation  Patient identified by MRN, date of birth, ID band Patient awake    Reviewed: Allergy & Precautions, NPO status , Patient's Chart, lab work & pertinent test results  History of Anesthesia Complications Negative for: history of anesthetic complications  Airway Mallampati: II  TM Distance: >3 FB Neck ROM: Full    Dental no notable dental hx.    Pulmonary neg pulmonary ROS, neg sleep apnea, neg COPD,    breath sounds clear to auscultation- rhonchi (-) wheezing      Cardiovascular Exercise Tolerance: Good hypertension, Pt. on medications (-) CAD, (-) Past MI, (-) Cardiac Stents and (-) CABG  Rhythm:Regular Rate:Normal - Systolic murmurs and - Diastolic murmurs    Neuro/Psych neg Seizures negative neurological ROS  negative psych ROS   GI/Hepatic Neg liver ROS, GERD  ,  Endo/Other  diabetes, Oral Hypoglycemic Agents  Renal/GU negative Renal ROS     Musculoskeletal negative musculoskeletal ROS (+)   Abdominal (+) - obese,   Peds  Hematology negative hematology ROS (+)   Anesthesia Other Findings Past Medical History: No date: Diabetes (HCC) No date: Diabetes mellitus without complication (HCC) No date: Gastritis No date: GERD (gastroesophageal reflux disease) No date: Hyperlipidemia No date: Hypertension   Reproductive/Obstetrics                             Anesthesia Physical Anesthesia Plan  ASA: II  Anesthesia Plan: General   Post-op Pain Management:    Induction: Intravenous  PONV Risk Score and Plan: 2 and Ondansetron, Dexamethasone and Midazolam  Airway Management Planned: Oral ETT  Additional Equipment:   Intra-op Plan:   Post-operative Plan: Extubation in OR  Informed Consent: I have reviewed the patients History and Physical, chart, labs and discussed the procedure including the risks, benefits and alternatives for the proposed anesthesia with  the patient or authorized representative who has indicated his/her understanding and acceptance.     Dental advisory given  Plan Discussed with: CRNA and Anesthesiologist  Anesthesia Plan Comments:         Anesthesia Quick Evaluation

## 2020-04-17 NOTE — Op Note (Signed)
Procedure Date:  04/17/2020  Pre-operative Diagnosis:  Symptomatic Reducible Umbilical hernia  Post-operative Diagnosis:  Symptomatic Reducible Umbilical hernia  Procedure:  Umbilical hernia repair with mesh  Surgeon:  Howie Ill, MD  Assistant:  Purvis Kilts, PA-S  Anesthesia:  General endotracheal  Estimated Blood Loss:  5 ml  Specimens:  Hernia sac  Complications:  None  Indications for Procedure:  This is a 49 y.o. female who presents with a symptomatic reducible umbilical hernia.  The risks of bleeding, abscess or infection, injury to surrounding structures, and need for further procedures were all discussed with the patient and was willing to proceed.  Description of Procedure: The patient was correctly identified in the preoperative area and brought into the operating room.  The patient was placed supine with VTE prophylaxis in place.  Appropriate time-outs were performed.  Anesthesia was induced and the patient was intubated.  Appropriate antibiotics were infused.  The abdomen was prepped and draped in a sterile fashion.  An infraumbilical incision was made and cautery was used to dissect down the subcutaneous tissue along the umbilical stalk.  Kelly forceps were used to dissect the umbilical stalk and it was divided at the fascia using cautery.  This allowed visualization of the hernia defect.  The hernia was reduced without complications.  The hernia sac was resected and sent to pathology.  The fascial edges were cleaned using cautery.  A large Ventralex ST mesh was introduced via the defect and spread open.  The tails were secured to the fascia using 2-0 Prolene sutures.  The fascia was then closed with 0 Ethibond sutures, incorporating a layer of the mesh with each suture.  The umbilical stalk was then reattached to the fascia using 2-0 Vicryl. The wound was irrigated and local anesthetic was infused.  The wound was then closed in layers using 3-0 Vicryl and 4-0 Monocryl.  The incision was cleaned and sealed with DermaBond.  The patient was emerged from anesthesia and extubated and brought to the recovery room for further management.  The patient tolerated the procedure well and all counts were correct at the end of the case.   Howie Ill, MD

## 2020-04-17 NOTE — Transfer of Care (Signed)
Immediate Anesthesia Transfer of Care Note  Patient: Connie Russell  Procedure(s) Performed: HERNIA REPAIR UMBILICAL ADULT, Open WITH MESH (N/A )  Patient Location: PACU  Anesthesia Type:General  Level of Consciousness: drowsy  Airway & Oxygen Therapy: Patient Spontanous Breathing and Patient connected to face mask oxygen  Post-op Assessment: Report given to RN and Post -op Vital signs reviewed and stable  Post vital signs: Reviewed and stable  Last Vitals:  Vitals Value Taken Time  BP 121/79 04/17/20 1042  Temp 36.2 C 04/17/20 1042  Pulse 88 04/17/20 1043  Resp 18 04/17/20 1043  SpO2 100 % 04/17/20 1043  Vitals shown include unvalidated device data.  Last Pain:  Vitals:   04/17/20 0745  TempSrc: Temporal  PainSc: 0-No pain         Complications: No complications documented.

## 2020-04-18 LAB — SURGICAL PATHOLOGY

## 2020-04-18 NOTE — Telephone Encounter (Signed)
Dr Leland Johns sent medication to new pharmacy. Called patient to verify that she received it, states that she did. Has no further concerns.

## 2020-04-22 MED FILL — LOSARTAN POTASSIUM 50 MG TA: 50 | 30 days supply | Qty: 30 | Fill #0

## 2020-04-30 ENCOUNTER — Other Ambulatory Visit: Payer: Self-pay | Admitting: Physician Assistant

## 2020-04-30 DIAGNOSIS — E1165 Type 2 diabetes mellitus with hyperglycemia: Secondary | ICD-10-CM

## 2020-04-30 MED FILL — LOSARTAN POTASSIUM 50 MG TA: 50 | 30 days supply | Qty: 30 | Fill #0

## 2020-04-30 MED FILL — metFORMIN HCL 1000 MG TABS: 1000 | 30 days supply | Qty: 60 | Fill #3

## 2020-04-30 NOTE — Telephone Encounter (Signed)
Requested medication (s) are due for refill today - unsure- overdue  Requested medication (s) are on the active medication list -yes  Future visit scheduled -yes  Last refill: 10/19/19 #30 2RF  Notes to clinic: Patient request RF- patient may have noncompliance with this medication- sent for review of request.  Requested Prescriptions  Pending Prescriptions Disp Refills   JANUVIA 50 MG tablet [Pharmacy Med Name: JANUVIA 50 MG TABLET 50 Tablet] 90 tablet 2    Sig: TAKE 1 TABLET (50 MG TOTAL) BY MOUTH DAILY.      Endocrinology:  Diabetes - DPP-4 Inhibitors Failed - 04/30/2020 11:50 AM      Failed - Cr in normal range and within 360 days    Creat  Date Value Ref Range Status  03/18/2016 0.61 0.50 - 1.10 mg/dL Final   Creatinine, Ser  Date Value Ref Range Status  04/12/2020 0.56 (L) 0.57 - 1.00 mg/dL Final          Passed - HBA1C is between 0 and 7.9 and within 180 days    HbA1c, POC (controlled diabetic range)  Date Value Ref Range Status  07/12/2018 8.5 (A) 0.0 - 7.0 % Final   Hgb A1c MFr Bld  Date Value Ref Range Status  03/06/2020 7.0 (H) 4.8 - 5.6 % Final    Comment:             Prediabetes: 5.7 - 6.4          Diabetes: >6.4          Glycemic control for adults with diabetes: <7.0           Passed - Valid encounter within last 6 months    Recent Outpatient Visits           2 weeks ago Preoperative evaluation to rule out surgical contraindication   [facility] Jonah Blue B, MD   1 month ago Controlled type 2 diabetes mellitus with hyperglycemia, without long-term current use of insulin [facility])   [facility] [place], [place], [place]   6 months ago Controlled type 2 diabetes mellitus with hyperglycemia, without long-term current use of insulin [facility])   [facility] [place], [place], [place]   7 months ago Appointment canceled by hospital   [facility] Marcine Matar, MD   1 year ago Menstrual migraine without status migrainosus, not intractable   [facility] [place], [place], [place]       Future Appointments             In 1 month Laural Benes, Binnie Rail, MD [facility]                Requested Prescriptions  Pending Prescriptions Disp Refills   JANUVIA 50 MG tablet [Pharmacy Med Name: JANUVIA 50 MG TABLET 50 Tablet] 90 tablet 2    Sig: TAKE 1 TABLET (50 MG TOTAL) BY MOUTH DAILY.      Endocrinology:  Diabetes - DPP-4 Inhibitors Failed - 04/30/2020 11:50 AM      Failed - Cr in normal range and within 360 days    Creat  Date Value Ref Range Status  03/18/2016 0.61 0.50 - 1.10 mg/dL Final   Creatinine, Ser  Date Value Ref Range Status  04/12/2020 0.56 (L) 0.57 - 1.00 mg/dL Final          Passed -  HBA1C is between 0 and 7.9 and within 180 days    HbA1c, POC (controlled diabetic range)  Date Value Ref Range Status  07/12/2018 8.5 (A) 0.0 - 7.0 % Final   Hgb A1c MFr Bld  Date Value Ref Range Status  03/06/2020 7.0 (H) 4.8 - 5.6 % Final    Comment:             Prediabetes: 5.7 - 6.4          Diabetes: >6.4          Glycemic control for adults with diabetes: <7.0           Passed - Valid encounter within last 6 months    Recent Outpatient Visits           2 weeks ago Preoperative evaluation to rule out surgical contraindication   [facility] [place], Gavin Pound B, MD   1 month ago Controlled type 2 diabetes mellitus with hyperglycemia, without long-term current use of insulin [facility])   [facility] [place], [place], [place]   6 months ago Controlled type 2 diabetes mellitus with hyperglycemia, without long-term current use of insulin [facility])   [facility] [place], [place], [place]   7 months ago Appointment canceled by hospital   [facility] Marcine Matar, MD   1 year ago Menstrual migraine without status migrainosus, not intractable   [facility] [place], [place], [place]       Future Appointments             In 1 month Laural Benes, Binnie Rail, MD [facility]

## 2020-05-01 ENCOUNTER — Encounter: Payer: Self-pay | Admitting: Emergency Medicine

## 2020-05-01 ENCOUNTER — Ambulatory Visit (INDEPENDENT_AMBULATORY_CARE_PROVIDER_SITE_OTHER): Payer: Self-pay | Admitting: Physician Assistant

## 2020-05-01 ENCOUNTER — Other Ambulatory Visit: Payer: Self-pay

## 2020-05-01 ENCOUNTER — Encounter: Payer: Self-pay | Admitting: Physician Assistant

## 2020-05-01 VITALS — BP 161/103 | HR 101 | Temp 98.4°F | Resp 12 | Wt 143.8 lb

## 2020-05-01 DIAGNOSIS — Z09 Encounter for follow-up examination after completed treatment for conditions other than malignant neoplasm: Secondary | ICD-10-CM

## 2020-05-01 DIAGNOSIS — K429 Umbilical hernia without obstruction or gangrene: Secondary | ICD-10-CM

## 2020-05-01 MED FILL — JANUVIA 50 MG TABLET: 50 | 30 days supply | Qty: 30 | Fill #0

## 2020-05-01 NOTE — Progress Notes (Signed)
[facility] POST-OP OFFICE VISIT  05/01/2020  HPI: Connie Russell is a 49 y.o. female 14 days s/p umbilical hernia repair with Dr Aleen Campi  She is doing well Only taking tylenol/motrin for pain She had some issues with constipation related to narcotics, which she has stopped. She bought an OTC stool softener but has not had much improvement No fever, chills, nausea, emesis No issues reported with her incisions She is mobilizing well and following restrictions No other complaints  Vital signs: BP (!) 161/103   Pulse (!) 101   Temp 98.4 F (36.9 C)   Resp 12   Wt 143 lb 12.8 oz (65.2 kg)   LMP 04/05/2020 (Exact Date)   SpO2 98%   BMI 24.68 kg/m    Physical Exam: Constitutional: Well appearing female, NAD Abdomen: Soft, non-tender, non-distended, no rebound/guarding Skin: Umbilical incision is healing well, dermabond remains, there is expected induration, no erythema or drainage  Assessment/Plan: This is a 49 y.o. female 14 days s/p umbilical hernia repair   - Recommended daily Miralax + stool softener for constipation; titrate down as this improves  - Continue Tylenol +/- Motrin for pain as needed  - Reviewed lifting restrictions; no more than 10-20 lbs for 6 weeks total  - Reviewed wound care; okay to shower  - RTC prn; advised to call with questions/concerns  -- Lynden Oxford, PA-C [facility] 05/01/2020, 10:34 AM [phone] M-F: 7am - 4pm

## 2020-05-01 NOTE — Patient Instructions (Signed)
Begin taking Miralax as needed for constipation.   Follow up as needed, call the office if you have any questions or concerns.  Comience a tomar Miralax segun sea necesario para el estreimiento.  Haga un seguimiento segun sea necesario, llame a la oficina si tiene alguna pregunta o inquietud.  GENERAL POST-OPERATIVE PATIENT INSTRUCTIONS   WOUND CARE INSTRUCTIONS:  Keep a dry clean dressing on the wound if there is drainage. The initial bandage may be removed after 24 hours.  Once the wound has quit draining you may leave it open to air.  If clothing rubs against the wound or causes irritation and the wound is not draining you may cover it with a dry dressing during the daytime.  Try to keep the wound dry and avoid ointments on the wound unless directed to do so.  If the wound becomes bright red and painful or starts to drain infected material that is not clear, please contact your physician immediately.  If the wound is mildly pink and has a thick firm ridge underneath it, this is normal, and is referred to as a healing ridge.  This will resolve over the next 4-6 weeks.  BATHING: You may shower if you have been informed of this by your surgeon. However, Please do not submerge in a tub, hot tub, or pool until incisions are completely sealed or have been told by your surgeon that you may do so.  DIET:  You may eat any foods that you can tolerate.  It is a good idea to eat a high fiber diet and take in plenty of fluids to prevent constipation.  If you do become constipated you may want to take a mild laxative or take ducolax tablets on a daily basis until your bowel habits are regular.  Constipation can be very uncomfortable, along with straining, after recent surgery.  ACTIVITY:  You are encouraged to cough and deep breath or use your incentive spirometer if you were given one, every 15-30 minutes when awake.  This will help prevent respiratory complications and low grade fevers post-operatively if  you had a general anesthetic.  You may want to hug a pillow when coughing and sneezing to add additional support to the surgical area, if you had abdominal or chest surgery, which will decrease pain during these times.  You are encouraged to walk and engage in light activity for the next two weeks.  You should not lift more than 10-15 pounds, 4-6 weeks after surgery as it could put you at increased risk for complications.  Twenty pounds is roughly equivalent to a plastic bag of groceries. At that time- Listen to your body when lifting, if you have pain when lifting, stop and then try again in a few days. Soreness after doing exercises or activities of daily living is normal as you get back in to your normal routine.  MEDICATIONS:  Try to take narcotic medications and anti-inflammatory medications, such as tylenol, ibuprofen, naprosyn, etc., with food.  This will minimize stomach upset from the medication.  Should you develop nausea and vomiting from the pain medication, or develop a rash, please discontinue the medication and contact your physician.  You should not drive, make important decisions, or operate machinery when taking narcotic pain medication.  SUNBLOCK Use sun block to incision area over the next year if this area will be exposed to sun. This helps decrease scarring and will allow you avoid a permanent darkened area over your incision.  QUESTIONS:  Please feel  free to call our office if you have any questions, and we will be glad to assist you.

## 2020-05-14 MED FILL — ?ATORVASTATIN 10 MG TABLET: 10 | 30 days supply | Qty: 30 | Fill #1

## 2020-05-30 MED FILL — ?LOSARTAN POTASSIUM 50 MG T: 50 | 30 days supply | Qty: 30 | Fill #1

## 2020-06-06 ENCOUNTER — Ambulatory Visit: Payer: Self-pay | Attending: Internal Medicine | Admitting: Internal Medicine

## 2020-06-06 ENCOUNTER — Other Ambulatory Visit: Payer: Self-pay

## 2020-06-06 ENCOUNTER — Other Ambulatory Visit: Payer: Self-pay | Admitting: Physician Assistant

## 2020-06-06 DIAGNOSIS — G8929 Other chronic pain: Secondary | ICD-10-CM

## 2020-06-06 DIAGNOSIS — M25519 Pain in unspecified shoulder: Secondary | ICD-10-CM

## 2020-06-06 DIAGNOSIS — Z3009 Encounter for other general counseling and advice on contraception: Secondary | ICD-10-CM

## 2020-06-06 DIAGNOSIS — I10 Essential (primary) hypertension: Secondary | ICD-10-CM

## 2020-06-06 DIAGNOSIS — E119 Type 2 diabetes mellitus without complications: Secondary | ICD-10-CM

## 2020-06-06 DIAGNOSIS — E1165 Type 2 diabetes mellitus with hyperglycemia: Secondary | ICD-10-CM

## 2020-06-06 MED ORDER — SITAGLIPTIN PHOSPHATE 50 MG PO TABS: ORAL_TABLET | ORAL | 6 refills | Status: DC

## 2020-06-06 MED FILL — !JANUVIA 50 MG TABLET: 50 | 30 days supply | Qty: 30 | Fill #0

## 2020-06-06 MED FILL — METFORMIN HCL 1000 MG TABS: 1000 | 30 days supply | Qty: 60 | Fill #0

## 2020-06-06 NOTE — Progress Notes (Signed)
Virtual Visit via Telephone Note Due to current restrictions/limitations of in-office visits due to the COVID-19 pandemic, this scheduled clinical appointment was converted to a telehealth visit  I connected with Connie Russell on 06/06/20 at  8:30 AM EDT by telephone and verified that I am speaking with the correct person using two identifiers.  Location: Patient: home Provider: office [place] Only the patient, myself and Clifton James from [employer] [phone]) participated in this encounter.  I discussed the limitations, risks, security and privacy concerns of performing an evaluation and management service by telephone and the availability of in person appointments. I also discussed with the patient that there may be a patient responsible charge related to this service. The patient expressed understanding and agreed to proceed.   History of Present Illness: Patient with history of diabetes type 2, HTN, HL, constipation, vitamin D deficiency.  Last seen 9/3/201.  This was her 3 mth f/u.  Pt had umbilical hernia repair surgery since last visit. Reports surgery went well and has f/u post-op with surgeon already. Wound has healed.  DM: checking BS 3 x a wk.  Gives range 135-140 before BF, nights before bedtime range up to 180. Doing well with eating habits.  No sodas or juice.  Eats rice but not a lot She has not started exercising again since surgery. She did not ask surgeon when she can start again -compliant with Metformin.  Out of Januvia.  Told by pharmacy that she needs to speak with me about it.  HTN:  Losartan increased to 50 mg on last visit.  Reports compliance. BP noted to be elev on post-op visit with surgical PA last mth. [facility] may be causing her BP to fluctuate.  Pills have been changed 2x because of this.  IUD recommended.  She has been [facility] but they are  telling her no appt available for next several mths.   Outpatient Encounter Medications as of 06/06/2020  Medication Sig  . Ascorbic Acid (VITAMIN C) 100 MG tablet Take 100 mg by mouth daily.  Marland Kitchen aspirin EC 81 MG tablet Take 1 tablet (81 mg total) by mouth daily.  Marland Kitchen atorvastatin (LIPITOR) 10 MG tablet Take 1 tablet (10 mg total) by mouth daily.  . Blood Glucose Monitoring Suppl (TRUE METRIX METER) w/Device KIT Use as directed  . glucose blood (TRUE METRIX BLOOD GLUCOSE TEST) test strip Use as instructed  . ibuprofen (ADVIL) 600 MG tablet Take 1 tablet (600 mg total) by mouth every 8 (eight) hours as needed for mild pain or moderate pain.  Marland Kitchen JANUVIA 50 MG tablet TAKE 1 TABLET (50 MG TOTAL) BY MOUTH DAILY.  Marland Kitchen losartan (COZAAR) 50 MG tablet Take 1 tablet (50 mg total) by mouth daily.  . metFORMIN (GLUCOPHAGE) 1000 MG tablet Take 1 tablet (1,000 mg total) by mouth 2 (two) times daily with a meal.  . Moringa Oleifera (MORINGA PO) Take 1 tablet by mouth daily.  . norethindrone (JENCYCLA) 0.35 MG tablet Take 1 tablet by mouth daily.  . TRUEplus Lancets 28G MISC Use as directed  . Vitamin D, Cholecalciferol, 400 units CAPS Take 400 Units by mouth every morning.   No facility-administered encounter medications on file as of 06/06/2020.      Observations/Objective: Lab Results  Component Value Date   HGBA1C 7.0 (H) 03/06/2020     Chemistry  Component Value Date/Time   NA 138 04/12/2020 1118   K 4.7 04/12/2020 1118   CL 100 04/12/2020 1118   CO2 25 04/12/2020 1118   BUN 11 04/12/2020 1118   CREATININE 0.56 (L) 04/12/2020 1118   CREATININE 0.61 03/18/2016 1244      Component Value Date/Time   CALCIUM 9.5 04/12/2020 1118   ALKPHOS 73 04/12/2020 1118   AST 14 04/12/2020 1118   ALT 23 04/12/2020 1118   BILITOT 0.3 04/12/2020 1118      Assessment and Plan: 1. Type 2 diabetes mellitus without complication, without long-term current use of insulin (HCC) Blood sugars at goal.  She  will continue Metformin and Januvia.  I have sent a refill on Januvia to the pharmacy for her.  Advised her that she should avoid exercises like abdominal crunches or weight lifting.  However she can resume brisk walking as long as it has been 6 to 8 weeks post surgery. - sitaGLIPtin (JANUVIA) 50 MG tablet; TAKE 1 TABLET (50 MG TOTAL) BY MOUTH DAILY.  Dispense: 30 tablet; Refill: 6  2. Hypertension, essential Give appt with my NP for BP recheck in the next 2 wks.  Her birth control pill may indeed be playing a role.  We will look into getting her in with Dr. Juleen China to have IUD placed.  However there may be a cost associated with this given that she is uninsured.  Encouraged her to continue to call the health department also to see if they would give her an appointment for IUD placement and she will take whichever appointment comes first.  3. Family planning See plan above in #2  4. Chronic shoulder pain, unspecified laterality At the end of the visit patient reports that she is still having some issues with pain in the left upper anterior chest close to the shoulder and over the clavicle that was reported to me back in August 2019.  Assessed to be musculoskeletal in nature and was given Naprosyn.  She tells me that the Naprosyn made her dizzy and she continues to have the issue.  She wonders whether an injection can be given.  I have scheduled her an appointment with the nurse practitioner in 2 weeks to have this addressed   Follow Up Instructions: 4 mths   I discussed the assessment and treatment plan with the patient. The patient was provided an opportunity to ask questions and all were answered. The patient agreed with the plan and demonstrated an understanding of the instructions.   The patient was advised to call back or seek an in-person evaluation if the symptoms worsen or if the condition fails to improve as anticipated.  I provided 23 minutes of non-face-to-face time during this  encounter.   Karle Plumber, MD

## 2020-06-17 ENCOUNTER — Telehealth: Payer: Self-pay | Admitting: Internal Medicine

## 2020-06-17 MED FILL — ?ATORVASTATIN 10 MG TABLET: 10 | 30 days supply | Qty: 30 | Fill #2

## 2020-06-17 NOTE — Telephone Encounter (Signed)
Patient called saying she thinks the medications she is taking are giving her a headache. Asked patient which medication is making her have headaches and patients said she did not know. Please f/u

## 2020-06-19 NOTE — Telephone Encounter (Signed)
Could you please contact pt and see what medication she is referring to so that we can better assist her

## 2020-06-20 NOTE — Telephone Encounter (Signed)
Pt did not answer and voicemail is not available.

## 2020-06-25 MED FILL — ?LOSARTAN POTASSIUM 50 MG T: 50 | 30 days supply | Qty: 30 | Fill #2

## 2020-07-22 MED FILL — LOSARTAN POTASSIUM 50 MG TA: 50 | 30 days supply | Qty: 30 | Fill #3

## 2020-07-22 MED FILL — METFORMIN HCL 1000 MG TABS: 1000 | 30 days supply | Qty: 60 | Fill #1

## 2020-08-27 MED FILL — LOSARTAN POTASSIUM 50 MG TA: 50 | 30 days supply | Qty: 30 | Fill #4

## 2020-08-27 MED FILL — METFORMIN HCL 1000 MG TABS: 1000 | 30 days supply | Qty: 60 | Fill #2

## 2020-09-19 ENCOUNTER — Telehealth: Payer: Self-pay | Admitting: Internal Medicine

## 2020-09-19 NOTE — Telephone Encounter (Signed)
Copied from CRM [id number]. Topic: General - Inquiry >> Sep 19, 2020  8:54 AM Adrian Prince D wrote: Reason for CRM: Patient would like to have a call back from [place], her insurance expired on the 4th and she wants to re apply. She would like the appointment to be in the morning. She can be reached at [phone]. Please advise.

## 2020-09-19 NOTE — Telephone Encounter (Signed)
I return Pt call, since she want the first appt in the morning she need to call back in a monday

## 2020-09-23 ENCOUNTER — Telehealth: Payer: Self-pay | Admitting: Internal Medicine

## 2020-09-23 NOTE — Telephone Encounter (Signed)
Pt is calling back through interpreter sonya id [phone] and will be at home number until 11 am and will like carlos to return her call

## 2020-09-23 NOTE — Telephone Encounter (Signed)
Return Pt call, schedule a financial appt for next week

## 2020-09-23 NOTE — Telephone Encounter (Signed)
I  Return Pt call, unable to LVM since the VM is full

## 2020-09-23 NOTE — Telephone Encounter (Signed)
pls call pt back as soon as free, [phone]

## 2020-09-25 MED FILL — LOSARTAN POTASSIUM 50 MG TA: 50 | 30 days supply | Qty: 30 | Fill #5

## 2020-09-30 ENCOUNTER — Ambulatory Visit: Payer: Self-pay | Attending: Internal Medicine

## 2020-09-30 ENCOUNTER — Other Ambulatory Visit: Payer: Self-pay

## 2020-10-07 ENCOUNTER — Other Ambulatory Visit: Payer: Self-pay

## 2020-10-07 ENCOUNTER — Ambulatory Visit: Payer: No Typology Code available for payment source | Attending: Internal Medicine

## 2020-10-14 MED FILL — ?METFORMIN HCL 1000 MG TAB: 1000 | 30 days supply | Qty: 60 | Fill #3

## 2020-10-17 ENCOUNTER — Other Ambulatory Visit: Payer: Self-pay

## 2020-10-17 ENCOUNTER — Encounter: Payer: Self-pay | Admitting: Physician Assistant

## 2020-10-17 ENCOUNTER — Ambulatory Visit: Payer: Self-pay | Attending: Physician Assistant | Admitting: Physician Assistant

## 2020-10-17 ENCOUNTER — Other Ambulatory Visit: Payer: Self-pay | Admitting: Physician Assistant

## 2020-10-17 VITALS — BP 159/103 | HR 92 | Ht 63.0 in | Wt 144.8 lb

## 2020-10-17 DIAGNOSIS — E785 Hyperlipidemia, unspecified: Secondary | ICD-10-CM

## 2020-10-17 DIAGNOSIS — E119 Type 2 diabetes mellitus without complications: Secondary | ICD-10-CM

## 2020-10-17 DIAGNOSIS — Z789 Other specified health status: Secondary | ICD-10-CM

## 2020-10-17 DIAGNOSIS — I10 Essential (primary) hypertension: Secondary | ICD-10-CM

## 2020-10-17 LAB — POCT GLYCOSYLATED HEMOGLOBIN (HGB A1C): HbA1c, POC (controlled diabetic range): 6.3 % (ref 0.0–7.0)

## 2020-10-17 LAB — GLUCOSE, POCT (MANUAL RESULT ENTRY): POC Glucose: 126 mg/dl — AB (ref 70–99)

## 2020-10-17 MED ORDER — ATORVASTATIN CALCIUM 10 MG PO TABS: 10.0000 mg | ORAL_TABLET | Freq: Every day | ORAL | 3 refills | Status: DC

## 2020-10-17 MED ORDER — METFORMIN HCL 1000 MG PO TABS: 1000.0000 mg | ORAL_TABLET | Freq: Two times a day (BID) | ORAL | 3 refills | Status: DC

## 2020-10-17 MED ORDER — LOSARTAN POTASSIUM 100 MG PO TABS: 100.0000 mg | ORAL_TABLET | Freq: Every day | ORAL | 3 refills | Status: DC

## 2020-10-17 MED ORDER — SITAGLIPTIN PHOSPHATE 50 MG PO TABS: ORAL_TABLET | ORAL | 6 refills | Status: DC

## 2020-10-17 MED FILL — ?ATORVASTATIN 10 MG TABLET: 10 | 30 days supply | Qty: 30 | Fill #0

## 2020-10-17 MED FILL — LOSARTAN POTASSIUM 100 MG T: 100 | 30 days supply | Qty: 30 | Fill #0

## 2020-10-17 NOTE — Progress Notes (Signed)
Patient ID: SAM WUNSCHEL, female   DOB: 1971-02-01, 50 y.o.   MRN: [id number]   Dacia Capers, is a 50 y.o. female  OMB:[id number]  GTX:[id number]  DOB - 1971-08-02  Subjective:  Chief Complaint and HPI: Veronnica Hennings is a 50 y.o. female here today for med management and RF.  She is on micronor [facility] and thinks it is causing her elevated blood pressure.  She is not checking blood sugar or blood pressures at home.  Admits to partial compliance on diet but does comply with medication routine.  We had to facetime her daughter to ensure we had the right name of her BC pill which took additional time in the visit.    Izora Gala with [employer] interpreters translated  ROS:   Constitutional:  No f/c, No night sweats, No unexplained weight loss. EENT:  No vision changes, No blurry vision, No hearing changes. No mouth, throat, or ear problems.  Respiratory: No cough, No SOB Cardiac: No CP, no palpitations GI:  No abd pain, No N/V/D. GU: No Urinary s/sx Musculoskeletal: No joint pain Neuro: No headache, no dizziness, no motor weakness.  Skin: No rash Endocrine:  No polydipsia. No polyuria.  Psych: Denies SI/HI  No problems updated.  ALLERGIES: Allergies  Allergen Reactions  . Linzess [Linaclotide] Diarrhea    DIARRHEA AT 145 MICROG    PAST MEDICAL HISTORY: Past Medical History:  Diagnosis Date  . Diabetes ([place])   . Diabetes mellitus without complication ([place])   . Gastritis   . GERD (gastroesophageal reflux disease)   . Hyperlipidemia   . Hypertension     MEDICATIONS AT HOME: Prior to Admission medications   Medication Sig Start Date End Date Taking? Authorizing Provider  Ascorbic Acid (VITAMIN C) 100 MG tablet Take 100 mg by mouth daily.   Yes [provider]  aspirin EC 81 MG tablet Take 1 tablet (81 mg total) by mouth daily. 10/19/19  Yes Argentina Donovan, PA-C  Blood Glucose Monitoring Suppl (TRUE METRIX METER) w/Device KIT Use as directed 01/12/19  Yes  Ladell Pier, MD  glucose blood (TRUE METRIX BLOOD GLUCOSE TEST) test strip Use as instructed 01/12/19  Yes Ladell Pier, MD  ibuprofen (ADVIL) 600 MG tablet Take 1 tablet (600 mg total) by mouth every 8 (eight) hours as needed for mild pain or moderate pain. 04/17/20  Yes Piscoya, Jacqulyn Bath, MD  losartan (COZAAR) 100 MG tablet Take 1 tablet (100 mg total) by mouth daily. 10/17/20  Yes Lucina Betty M, PA-C  Moringa Oleifera (MORINGA PO) Take 1 tablet by mouth daily.   Yes [provider]  norethindrone (MICRONOR) 0.35 MG tablet Take 1 tablet by mouth daily.   Yes [provider]  TRUEplus Lancets 28G MISC Use as directed 01/12/19  Yes Ladell Pier, MD  Vitamin D, Cholecalciferol, 400 units CAPS Take 400 Units by mouth every morning. 01/01/17  Yes Ladell Pier, MD  atorvastatin (LIPITOR) 10 MG tablet Take 1 tablet (10 mg total) by mouth daily. 10/17/20   Argentina Donovan, PA-C  metFORMIN (GLUCOPHAGE) 1000 MG tablet Take 1 tablet (1,000 mg total) by mouth 2 (two) times daily with a meal. 10/17/20   Jalani Rominger, Dionne Bucy, PA-C  sitaGLIPtin (JANUVIA) 50 MG tablet TAKE 1 TABLET (50 MG TOTAL) BY MOUTH DAILY. 10/17/20   Argentina Donovan, PA-C     Objective:  EXAM:   Vitals:   10/17/20 1608  BP: (!) 159/103  Pulse: 92  SpO2: 98%  Weight:  144 lb 12.8 oz (65.7 kg)  Height: 5' 3"  (1.6 m)    General appearance : A&OX3. NAD. Non-toxic-appearing HEENT: Atraumatic and Normocephalic.  PERRLA. EOM intact.   Chest/Lungs:  Breathing-non-labored, Good air entry bilaterally, breath sounds normal without rales, rhonchi, or wheezing  CVS: S1 S2 regular, no murmurs, gallops, rubs  Extremities: Bilateral Lower Ext shows no edema, both legs are warm to touch with = pulse throughout Neurology:  CN II-XII grossly intact, Non focal.   Psych:  TP linear. J/I WNL. Normal speech. Appropriate eye contact and affect.  Skin:  No Rash  Data Review Lab Results  Component Value Date    HGBA1C 6.3 10/17/2020   HGBA1C 7.0 (H) 03/06/2020   HGBA1C 6.5 (H) 10/20/2019     Assessment & Plan   1. Type 2 diabetes mellitus without complication, without long-term current use of insulin (HCC) Improving.  Continue current regimen and continue to work on diet - Glucose (CBG) - HgB A1c - metFORMIN (GLUCOPHAGE) 1000 MG tablet; Take 1 tablet (1,000 mg total) by mouth 2 (two) times daily with a meal.  Dispense: 60 tablet; Refill: 3 - sitaGLIPtin (JANUVIA) 50 MG tablet; TAKE 1 TABLET (50 MG TOTAL) BY MOUTH DAILY.  Dispense: 30 tablet; Refill: 6 - Comprehensive metabolic panel  2. Hypertension, essential Not at goal-doubt due to [facility] bc progestin only tab.  Increase does losartan to 144m and check BP daily at home and record and bring to next visit - losartan (COZAAR) 100 MG tablet; Take 1 tablet (100 mg total) by mouth daily.  Dispense: 90 tablet; Refill: 3   3. Hyperlipidemia, unspecified hyperlipidemia type  - atorvastatin (LIPITOR) 10 MG tablet; Take 1 tablet (10 mg total) by mouth daily.  Dispense: 30 tablet; Refill: 3 - Lipid panel  4. Language barrier [employer] interpreters used and additional time performing visit was required.    Patient have been counseled extensively about nutrition and exercise  [facility].  The patient was given clear instructions to go to ER or return to medical center if symptoms don't improve, worsen or new problems develop. The patient verbalized understanding. The patient was told to call to get lab results if they haven't heard anything in the next week.     AFreeman Caldron PA-C [facility]  10/17/2020, 4:27 PM

## 2020-10-17 NOTE — Patient Instructions (Signed)
Check blood pressures daily and record

## 2020-10-18 LAB — COMPREHENSIVE METABOLIC PANEL
ALT: 15 IU/L (ref 0–32)
AST: 14 IU/L (ref 0–40)
Albumin/Globulin Ratio: 1.9 (ref 1.2–2.2)
Albumin: 4.8 g/dL (ref 3.8–4.8)
Alkaline Phosphatase: 70 IU/L (ref 44–121)
BUN/Creatinine Ratio: 21 (ref 9–23)
BUN: 11 mg/dL (ref 6–24)
Bilirubin Total: 0.3 mg/dL (ref 0.0–1.2)
CO2: 22 mmol/L (ref 20–29)
Calcium: 9.8 mg/dL (ref 8.7–10.2)
Chloride: 102 mmol/L (ref 96–106)
Creatinine, Ser: 0.52 mg/dL — ABNORMAL LOW (ref 0.57–1.00)
Globulin, Total: 2.5 g/dL (ref 1.5–4.5)
Glucose: 115 mg/dL — ABNORMAL HIGH (ref 65–99)
Potassium: 4.5 mmol/L (ref 3.5–5.2)
Sodium: 139 mmol/L (ref 134–144)
Total Protein: 7.3 g/dL (ref 6.0–8.5)
eGFR: 115 mL/min/{1.73_m2} (ref >59)

## 2020-10-18 LAB — LIPID PANEL
Chol/HDL Ratio: 2.7 ratio (ref 0.0–4.4)
Cholesterol, Total: 134 mg/dL (ref 100–199)
HDL: 49 mg/dL (ref >39)
LDL Chol Calc (NIH): 54 mg/dL (ref 0–99)
Triglycerides: 188 mg/dL — ABNORMAL HIGH (ref 0–149)
VLDL Cholesterol Cal: 31 mg/dL (ref 5–40)

## 2020-11-05 ENCOUNTER — Ambulatory Visit: Payer: Self-pay | Attending: Internal Medicine | Admitting: Pharmacist

## 2020-11-05 ENCOUNTER — Other Ambulatory Visit: Payer: Self-pay | Admitting: Internal Medicine

## 2020-11-05 ENCOUNTER — Other Ambulatory Visit: Payer: Self-pay

## 2020-11-05 ENCOUNTER — Encounter: Payer: Self-pay | Admitting: Pharmacist

## 2020-11-05 VITALS — BP 145/89

## 2020-11-05 DIAGNOSIS — I10 Essential (primary) hypertension: Secondary | ICD-10-CM

## 2020-11-05 MED ORDER — AMLODIPINE BESYLATE 5 MG PO TABS: 5.0000 mg | ORAL_TABLET | Freq: Every day | ORAL | 2 refills | Status: DC

## 2020-11-05 MED FILL — AMLODIPINE BESYLATE 5 MG TA: 5 | 30 days supply | Qty: 30 | Fill #0

## 2020-11-05 NOTE — Progress Notes (Signed)
   S:    Patient arrives well and in good spirits.    Presents to the clinic for hypertension evaluation, counseling, and management.  Patient was referred and last seen by Georgian Co on 10/17/20. At this visit, BP was elevated at 159/103 - losartan was increased to 100 mg.   Today, patient reports appropriate adherence to increased losartan dose. She reports daily headaches with some occasional dizziness and fatigue. She works in the [employer] and reports this can be a high stress environment at times. She mentions feeling "off" and having low energy to the point of not wanting to speak in the afternoons. We evaluated her for hypoglycemia, which is unlikely the cause based on subjective information provided (BG in clinic today 116). Her daughter assists with checking her blood pressure daily at home.   Current BP Medications include:  Losartan 100 mg once daily  Family / Social history:  Hypertension (mother), diabetes (maternal grandmother)  non-smoker  O:  Vitals:   11/05/20 1528  BP: (!) 145/89   Home BP readings: 145/90s (subjectively reported)  Clinic Fasting BG: 116  Last 3 Office BP readings: BP Readings from Last 3 Encounters:  11/05/20 (!) 145/89  10/17/20 (!) 159/103  05/01/20 (!) 161/103   BMET    Component Value Date/Time   NA 139 10/17/2020 1630   K 4.5 10/17/2020 1630   CL 102 10/17/2020 1630   CO2 22 10/17/2020 1630   GLUCOSE 115 (H) 10/17/2020 1630   GLUCOSE 120 (H) 03/04/2017 1346   BUN 11 10/17/2020 1630   CREATININE 0.52 (L) 10/17/2020 1630   CREATININE 0.61 03/18/2016 1244   CALCIUM 9.8 10/17/2020 1630   GFRNONAA 111 04/12/2020 1118   GFRNONAA >89 03/18/2016 1244   GFRAA 128 04/12/2020 1118   GFRAA >89 03/18/2016 1244   Renal function: CrCl cannot be calculated (Unknown ideal weight.).  Clinical ASCVD: No  The 10-year ASCVD risk score Denman George DC Jr., et al., 2013) is: 2.3%   Values used to calculate the score:     Age: 50 years      Sex: Female     Is Non-Hispanic African American: No     Diabetic: Yes     Tobacco smoker: No     Systolic Blood Pressure: 145 mmHg     Is BP treated: Yes     HDL Cholesterol: 49 mg/dL     Total Cholesterol: 134 mg/dL  A/P: Hypertension diagnosed, currently uncontrolled on current medications. BP Goal = < 130/80 mmHg. Medication adherence appropriate. Patient at max dose of losartan with blood pressure improving, yet still elevated. BP is likely even more elevated when at work during high stress environment. Discussed the need for additional anti-hypertensive therapy, and patient is amenable to this change today. Will start low dose amlodipine and follow up in 1 month. -Continued losartan 100 mg once daily. -Started amlodipine 5 mg once daily.   -Counseled on lifestyle modifications for blood pressure control including reduced dietary sodium, increased exercise, adequate sleep.  Results reviewed and written information provided.   Total time in face-to-face counseling 20 minutes.   F/U Clinic Pharmacy visit in 1 month.  Patient seen with Samual Beals.  Theodis Sato, PharmD PGY-1 [facility] Resident  11/05/2020 3:28 PM

## 2020-11-13 ENCOUNTER — Other Ambulatory Visit: Payer: Self-pay

## 2020-11-13 MED FILL — Metformin HCl Tab 1000 MG: ORAL | 30 days supply | Qty: 60 | Fill #0 | Status: AC

## 2020-11-14 ENCOUNTER — Other Ambulatory Visit: Payer: Self-pay

## 2020-12-03 MED FILL — Amlodipine Besylate Tab 5 MG (Base Equivalent): ORAL | 30 days supply | Qty: 30 | Fill #0 | Status: AC

## 2020-12-03 MED FILL — Atorvastatin Calcium Tab 10 MG (Base Equivalent): ORAL | 30 days supply | Qty: 30 | Fill #0 | Status: AC

## 2020-12-04 ENCOUNTER — Other Ambulatory Visit: Payer: Self-pay

## 2020-12-09 ENCOUNTER — Encounter: Payer: Self-pay | Admitting: Pharmacist

## 2020-12-09 ENCOUNTER — Ambulatory Visit: Payer: Self-pay | Attending: Internal Medicine | Admitting: Pharmacist

## 2020-12-09 ENCOUNTER — Other Ambulatory Visit: Payer: Self-pay

## 2020-12-09 VITALS — BP 142/87

## 2020-12-09 DIAGNOSIS — I10 Essential (primary) hypertension: Secondary | ICD-10-CM

## 2020-12-09 MED FILL — Losartan Potassium Tab 100 MG: ORAL | 30 days supply | Qty: 30 | Fill #0 | Status: AC

## 2020-12-09 MED FILL — Metformin HCl Tab 1000 MG: ORAL | 30 days supply | Qty: 60 | Fill #1 | Status: AC

## 2020-12-09 NOTE — Progress Notes (Signed)
   S:    Patient arrives well and in good spirits. Presents to the clinic for hypertension evaluation, counseling, and management. Patient was referred and last seen by Georgian Co on 10/17/20. At this visit, BP was elevated at 159/103 - losartan was increased to 100 mg. We last saw her on 11/05/2020 and she reported compliance with losartan. BP was above goal but improving so we added low-dose amlodipine.   Today, patient reports adherence to amlodipine is not taking losartan. She thought she was not to continue losartan when we started amlodipine.   Current BP Medications include:  Amlodipine 5 mg daily, losartan 100 mg once daily (not taking currently)  Family / Social history:  Hypertension (mother), diabetes (maternal grandmother)  non-smoker  O:  Vitals:   12/09/20 0849  BP: (!) 142/87     Home BP readings: none  Last 3 Office BP readings: BP Readings from Last 3 Encounters:  12/09/20 (!) 142/87  11/05/20 (!) 145/89  10/17/20 (!) 159/103   BMET    Component Value Date/Time   NA 139 10/17/2020 1630   K 4.5 10/17/2020 1630   CL 102 10/17/2020 1630   CO2 22 10/17/2020 1630   GLUCOSE 115 (H) 10/17/2020 1630   GLUCOSE 120 (H) 03/04/2017 1346   BUN 11 10/17/2020 1630   CREATININE 0.52 (L) 10/17/2020 1630   CREATININE 0.61 03/18/2016 1244   CALCIUM 9.8 10/17/2020 1630   GFRNONAA 111 04/12/2020 1118   GFRNONAA >89 03/18/2016 1244   GFRAA 128 04/12/2020 1118   GFRAA >89 03/18/2016 1244   Renal function: CrCl cannot be calculated (Patient's most recent lab result is older than the maximum 21 days allowed.).  Clinical ASCVD: No  The 10-year ASCVD risk score Denman George DC Jr., et al., 2013) is: 2.2%   Values used to calculate the score:     Age: 50 years     Sex: Female     Is Non-Hispanic African American: No     Diabetic: Yes     Tobacco smoker: No     Systolic Blood Pressure: 142 mmHg     Is BP treated: Yes     HDL Cholesterol: 49 mg/dL     Total Cholesterol: 134  mg/dL  A/P: Hypertension longstanding currently above goal on current medications. BP Goal = < 130/80 mmHg. Medication adherence is suboptimal. Patient is not taking losartan.  -Continued losartan 100 mg once daily. -Continue amlodipine 5 mg once daily.   -Counseled on lifestyle modifications for blood pressure control including reduced dietary sodium, increased exercise, adequate sleep.  Results reviewed and written information provided.   Total time in face-to-face counseling 20 minutes.   F/U Clinic Pharmacy visit in 2-3 weeks.   Butch Penny, PharmD, [place], CPP Clinical Pharmacist [facility] [phone]

## 2020-12-10 LAB — CMP14+EGFR
ALT: 20 IU/L (ref 0–32)
AST: 15 IU/L (ref 0–40)
Albumin/Globulin Ratio: 2 (ref 1.2–2.2)
Albumin: 4.8 g/dL (ref 3.8–4.8)
Alkaline Phosphatase: 59 IU/L (ref 44–121)
BUN/Creatinine Ratio: 15 (ref 9–23)
BUN: 8 mg/dL (ref 6–24)
Bilirubin Total: 0.6 mg/dL (ref 0.0–1.2)
CO2: 22 mmol/L (ref 20–29)
Calcium: 9.4 mg/dL (ref 8.7–10.2)
Chloride: 101 mmol/L (ref 96–106)
Creatinine, Ser: 0.53 mg/dL — ABNORMAL LOW (ref 0.57–1.00)
Globulin, Total: 2.4 g/dL (ref 1.5–4.5)
Glucose: 109 mg/dL — ABNORMAL HIGH (ref 65–99)
Potassium: 4.3 mmol/L (ref 3.5–5.2)
Sodium: 139 mmol/L (ref 134–144)
Total Protein: 7.2 g/dL (ref 6.0–8.5)
eGFR: 114 mL/min/{1.73_m2} (ref >59)

## 2020-12-24 ENCOUNTER — Other Ambulatory Visit: Payer: Self-pay | Admitting: Pharmacist

## 2020-12-24 ENCOUNTER — Other Ambulatory Visit: Payer: Self-pay

## 2020-12-24 DIAGNOSIS — E119 Type 2 diabetes mellitus without complications: Secondary | ICD-10-CM

## 2020-12-24 MED ORDER — SITAGLIPTIN PHOSPHATE 50 MG PO TABS: ORAL_TABLET | Freq: Every day | ORAL | 6 refills | Status: DC

## 2020-12-30 NOTE — Progress Notes (Signed)
   S:    PCP: Jonah Blue  PMH: HTN, GERD, T2DM, HLD,   Patient arrives in good spirits. Presents to the clinic for hypertension evaluation, counseling, and management. Patient was last seen by Butch Penny, Pharm D on 12/09/20. Of note, pt was not taking Losartan because she thought Amlodipine was a replacement. She was instructed to take both at that visit.  Today pt reports shoulder pain and states it has resolved with taking OTC aspirin. She was instructed to address this with her PCP at her next visit if pain persists.   Medication adherence reported. Has taken both her medications this morning.   Denies dizziness, dsypnea, headache, peripheral edema.    Current BP Medications include:   Losartan 100 mg tab- Take 1 tab PO daily  Amlodipine 5 mg tab - Take 1 tab PO daily   Dietary habits include: compliant with salt restriction; denies excessive caffeine intake  Exercise habits include: Walks 2 times per week Family / Social history:  FHx: Mother - HTN, Maternal grandmother - DM  Tobacco - denies use  Alcohol - denies use   O:  Vitals:   12/31/20 0914  BP: 113/77  Pulse: 77   Home BP readings: does not check at home   Last 3 Office BP readings: BP Readings from Last 3 Encounters:  12/31/20 113/77  12/09/20 (!) 142/87  11/05/20 (!) 145/89   BMET    Component Value Date/Time   NA 139 12/09/2020 0850   K 4.3 12/09/2020 0850   CL 101 12/09/2020 0850   CO2 22 12/09/2020 0850   GLUCOSE 109 (H) 12/09/2020 0850   GLUCOSE 120 (H) 03/04/2017 1346   BUN 8 12/09/2020 0850   CREATININE 0.53 (L) 12/09/2020 0850   CREATININE 0.61 03/18/2016 1244   CALCIUM 9.4 12/09/2020 0850   GFRNONAA 111 04/12/2020 1118   GFRNONAA >89 03/18/2016 1244   GFRAA 128 04/12/2020 1118   GFRAA >89 03/18/2016 1244   Renal function: CrCl cannot be calculated (Patient's most recent lab result is older than the maximum 21 days allowed.).  Clinical ASCVD: No  The 10-year ASCVD risk  score Denman George DC Jr., et al., 2013) is: 1.4%   Values used to calculate the score:     Age: 50 years     Sex: Female     Is Non-Hispanic African American: No     Diabetic: Yes     Tobacco smoker: No     Systolic Blood Pressure: 113 mmHg     Is BP treated: Yes     HDL Cholesterol: 49 mg/dL     Total Cholesterol: 134 mg/dL  A/P: Hypertension longstanding currently controlled on current medications. BP Goal = <130/80 mmHg. Pt is currently taking both Losartan and Amlodipine. Medication adherence reported. Counseled on lifestyle modifications for blood pressure control including reduced dietary sodium, increased exercise, adequate sleep. Continue Losartan 100 mg tab- Take 1 tab PO daily  Continue Amlodipine 5 mg tab - Take 1 tab PO daily   Results reviewed and written information provided. Total time in face-to-face counseling 30 minutes. F/U with PCP 01/2021.    Patient seen with:  [place] Eulalio Reamy, PharmD Candidate 23'  [employer]   Butch Penny, PharmD, [place], CPP Clinical Pharmacist [facility] [phone]

## 2020-12-31 ENCOUNTER — Ambulatory Visit: Payer: Self-pay | Attending: Internal Medicine | Admitting: Pharmacist

## 2020-12-31 ENCOUNTER — Other Ambulatory Visit: Payer: Self-pay

## 2020-12-31 ENCOUNTER — Encounter: Payer: Self-pay | Admitting: Pharmacist

## 2020-12-31 VITALS — BP 113/77 | HR 77

## 2020-12-31 DIAGNOSIS — I10 Essential (primary) hypertension: Secondary | ICD-10-CM

## 2020-12-31 MED FILL — Amlodipine Besylate Tab 5 MG (Base Equivalent): ORAL | 30 days supply | Qty: 30 | Fill #1 | Status: AC

## 2021-01-06 MED FILL — Atorvastatin Calcium Tab 10 MG (Base Equivalent): ORAL | 30 days supply | Qty: 30 | Fill #1 | Status: AC

## 2021-01-07 ENCOUNTER — Other Ambulatory Visit: Payer: Self-pay

## 2021-01-08 ENCOUNTER — Other Ambulatory Visit: Payer: Self-pay

## 2021-01-08 MED FILL — Losartan Potassium Tab 100 MG: ORAL | 30 days supply | Qty: 30 | Fill #1 | Status: AC

## 2021-01-13 ENCOUNTER — Other Ambulatory Visit: Payer: Self-pay

## 2021-01-13 ENCOUNTER — Ambulatory Visit: Payer: Self-pay | Attending: Internal Medicine | Admitting: Internal Medicine

## 2021-01-13 DIAGNOSIS — I152 Hypertension secondary to endocrine disorders: Secondary | ICD-10-CM

## 2021-01-13 DIAGNOSIS — Z1211 Encounter for screening for malignant neoplasm of colon: Secondary | ICD-10-CM

## 2021-01-13 DIAGNOSIS — E1159 Type 2 diabetes mellitus with other circulatory complications: Secondary | ICD-10-CM

## 2021-01-13 DIAGNOSIS — E785 Hyperlipidemia, unspecified: Secondary | ICD-10-CM

## 2021-01-13 DIAGNOSIS — E1169 Type 2 diabetes mellitus with other specified complication: Secondary | ICD-10-CM

## 2021-01-13 MED ORDER — LOSARTAN POTASSIUM 100 MG PO TABS
100.0000 mg | ORAL_TABLET | Freq: Every day | ORAL | 3 refills | Status: DC
  Filled 2021-01-13: qty 90, 90d supply, fill #0
  Filled 2021-02-02: qty 30, 30d supply, fill #0
  Filled 2021-03-03: qty 30, 30d supply, fill #1
  Filled 2021-04-02: qty 30, 30d supply, fill #2
  Filled 2021-05-04: qty 30, 30d supply, fill #3
  Filled 2021-06-02: qty 30, 30d supply, fill #4
  Filled 2021-06-29: qty 30, 30d supply, fill #5
  Filled 2021-08-06: qty 30, 30d supply, fill #6
  Filled 2021-09-07: qty 30, 30d supply, fill #7
  Filled 2021-09-08: qty 30, 30d supply, fill #0
  Filled 2021-10-09: qty 30, 30d supply, fill #1
  Filled 2021-11-09: qty 90, 90d supply, fill #2

## 2021-01-13 MED ORDER — AMLODIPINE BESYLATE 5 MG PO TABS
ORAL_TABLET | Freq: Every day | ORAL | 3 refills | Status: DC
  Filled 2021-01-13: qty 90, fill #0
  Filled 2021-02-02: qty 30, 30d supply, fill #0
  Filled 2021-03-03: qty 30, 30d supply, fill #1
  Filled 2021-04-02: qty 30, 30d supply, fill #2
  Filled 2021-05-04: qty 30, 30d supply, fill #3
  Filled 2021-06-02: qty 30, 30d supply, fill #4
  Filled 2021-06-29: qty 90, 90d supply, fill #5
  Filled 2021-09-30: qty 90, 90d supply, fill #0
  Filled 2021-09-30: qty 90, 90d supply, fill #6
  Filled 2021-12-16: qty 30, 30d supply, fill #1

## 2021-01-13 MED ORDER — METFORMIN HCL 1000 MG PO TABS
ORAL_TABLET | Freq: Two times a day (BID) | ORAL | 3 refills | Status: DC
  Filled 2021-01-13: qty 60, 30d supply, fill #0
  Filled 2021-02-02: qty 60, 30d supply, fill #1
  Filled 2021-03-11: qty 60, 30d supply, fill #2
  Filled 2021-04-10: qty 60, 30d supply, fill #3
  Filled 2021-05-04: qty 60, 30d supply, fill #4
  Filled 2021-06-12: qty 60, 30d supply, fill #5
  Filled 2021-07-14: qty 60, 30d supply, fill #6
  Filled 2021-08-14: qty 60, 30d supply, fill #7
  Filled 2021-08-15: qty 60, 30d supply, fill #0
  Filled 2021-09-15: qty 60, 30d supply, fill #1
  Filled 2021-10-13: qty 60, 30d supply, fill #2
  Filled 2021-11-12: qty 60, 30d supply, fill #3
  Filled 2021-12-15: qty 60, 30d supply, fill #4

## 2021-01-13 MED ORDER — ATORVASTATIN CALCIUM 10 MG PO TABS
ORAL_TABLET | Freq: Every day | ORAL | 3 refills | Status: DC
  Filled 2021-01-13: qty 90, fill #0
  Filled 2021-02-02: qty 30, 30d supply, fill #0
  Filled 2021-03-10: qty 30, 30d supply, fill #1
  Filled 2021-04-10: qty 30, 30d supply, fill #2
  Filled 2021-05-12: qty 30, 30d supply, fill #3
  Filled 2021-06-10: qty 30, 30d supply, fill #4
  Filled 2021-07-14: qty 30, 30d supply, fill #5
  Filled 2021-08-14: qty 30, 30d supply, fill #6
  Filled 2021-08-15: qty 30, 30d supply, fill #0
  Filled 2021-09-29: qty 30, 30d supply, fill #1
  Filled 2021-11-02: qty 30, 30d supply, fill #2
  Filled 2021-12-07: qty 30, 30d supply, fill #3
  Filled 2021-12-16: qty 30, 30d supply, fill #4

## 2021-01-13 MED ORDER — SITAGLIPTIN PHOSPHATE 50 MG PO TABS
ORAL_TABLET | Freq: Every day | ORAL | 3 refills | Status: DC
  Filled 2021-01-13: qty 30, 30d supply, fill #0

## 2021-01-13 NOTE — Progress Notes (Signed)
Virtual Visit via Telephone Note  I connected with Connie Russell on 01/13/2021 at 8:35 a.m by telephone and verified that I am speaking with the correct person using two identifiers  Location: Patient: home Provider: office  Participants: Myself Patient [place] interpreter: Wynona Meals, [phone]   I discussed the limitations, risks, security and privacy concerns of performing an evaluation and management service by telephone and the availability of in person appointments. I also discussed with the patient that there may be a patient responsible charge related to this service. The patient expressed understanding and agreed to proceed.   History of Present Illness: Patient with history of diabetes type 2, HTN,HL,constipation, vitamin D deficiency.  Patient was last evaluated by our PA 10/2020.  Last evaluation by me 05/2020.  HTN: seen by clinical pharmacist 3 x since last visit for recheck BP.  On last visit BP was good on Losartan and Norvasc.  Compliant with meds Checking BP 2 x a wk at home.  She does not recall her numbers but states they have been good. Limits salt in her foods  DM: Lab Results  Component Value Date   HGBA1C 6.3 10/17/2020  Reports BS have been good.  Highest 160.  No low BS.  Checks BS 2-3x/wk.  Compliant with Metformin and Januvia Doing well with eating habits. Walking 3-4 x/wk Due for eye exam; last eye exam 2 yrs ago.  Reports blurriness in one eye.   HL: reports taking Lipitor consistently. Last LFTs good  HM: due for eye exam and colon CA screening.    Outpatient Encounter Medications as of 01/13/2021  Medication Sig  . amLODipine (NORVASC) 5 MG tablet TAKE 1 TABLET (5 MG TOTAL) BY MOUTH DAILY.  [place] Kitchen Ascorbic Acid (VITAMIN C) 100 MG tablet Take 100 mg by mouth daily.  Mar[place] Kitchen aspirin EC 81 MG tablet Take 1 tablet (81 mg total) by mouth daily.  Marland Kitchen atorvastatin (LIPITOR) 10 MG tablet TAKE 1 TABLET (10 MG TOTAL) BY MOUTH DAILY.  Marland Kitchen Blood Glucose Monitoring Suppl  (TRUE METRIX METER) w/Device KIT Use as directed  . glucose blood (TRUE METRIX BLOOD GLUCOSE TEST) test strip Use as instructed  . ibuprofen (ADVIL) 600 MG tablet Take 1 tablet (600 mg total) by mouth every 8 (eight) hours as needed for mild pain or moderate pain.  Marland Kitchen losartan (COZAAR) 100 MG tablet Take 1 tablet (110m) by mouth once daily.  . metFORMIN (GLUCOPHAGE) 1000 MG tablet TAKE 1 TABLET (1,000 MG TOTAL) BY MOUTH 2 (TWO) TIMES DAILY WITH A MEAL.  .Marland KitchenMoringa Oleifera (MORINGA PO) Take 1 tablet by mouth daily.  . norethindrone (MICRONOR) 0.35 MG tablet Take 1 tablet by mouth daily.  . sitaGLIPtin (JANUVIA) 50 MG tablet TAKE 1 TABLET (50 MG TOTAL) BY MOUTH DAILY.  . TRUEplus Lancets 28G MISC Use as directed  . Vitamin D, Cholecalciferol, 400 units CAPS Take 400 Units by mouth every morning.   No facility-administered encounter medications on file as of 01/13/2021.      Observations/Objective:  Lab Results  Component Value Date   HGBA1C 6.3 10/17/2020    Results for orders placed or performed in visit on 12/09/20  CMP14+EGFR  Result Value Ref Range   Glucose 109 (H) 65 - 99 mg/dL   BUN 8 6 - 24 mg/dL   Creatinine, Ser 0.53 (L) 0.57 - 1.00 mg/dL   eGFR 114 >59 mL/min/1.73   BUN/Creatinine Ratio 15 9 - 23   Sodium 139 134 - 144 mmol/L   Potassium  4.3 3.5 - 5.2 mmol/L   Chloride 101 96 - 106 mmol/L   CO2 22 20 - 29 mmol/L   Calcium 9.4 8.7 - 10.2 mg/dL   Total Protein 7.2 6.0 - 8.5 g/dL   Albumin 4.8 3.8 - 4.8 g/dL   Globulin, Total 2.4 1.5 - 4.5 g/dL   Albumin/Globulin Ratio 2.0 1.2 - 2.2   Bilirubin Total 0.6 0.0 - 1.2 mg/dL   Alkaline Phosphatase 59 44 - 121 IU/L   AST 15 0 - 40 IU/L   ALT 20 0 - 32 IU/L     Assessment and Plan: 1. Hypertension associated with type 2 diabetes mellitus ([place]) Blood sugars at goal on metformin and Januvia.  She will continue current medications and low-salt diet.  Encourage her to continue trying to eat healthy and to move as much as she  can. - losartan (COZAAR) 100 MG tablet; Take 1 tablet (172m) by mouth once daily.  Dispense: 90 tablet; Refill: 3 - metFORMIN (GLUCOPHAGE) 1000 MG tablet; TAKE 1 TABLET (1,000 MG TOTAL) BY MOUTH 2 (TWO) TIMES DAILY WITH A MEAL.  Dispense: 180 tablet; Refill: 3 - amLODipine (NORVASC) 5 MG tablet; TAKE 1 TABLET (5 MG TOTAL) BY MOUTH DAILY.  Dispense: 90 tablet; Refill: 3 - sitaGLIPtin (JANUVIA) 50 MG tablet; TAKE 1 TABLET (50 MG TOTAL) BY MOUTH DAILY.  Dispense: 90 tablet; Refill: 3 - Microalbumin / creatinine urine ratio; Future  2. Hyperlipidemia associated with type 2 diabetes mellitus (HCC) Continue Lipitor. - atorvastatin (LIPITOR) 10 MG tablet; TAKE 1 TABLET (10 MG TOTAL) BY MOUTH DAILY.  Dispense: 90 tablet; Refill: 3  3. Screening for colon cancer Discussed colon cancer screening with her.  She is now at the age where colon cancer screening is recommended.  Discussed methods of screening.  She is uninsured.  She is agreeable to doing the fit kit.  Patient will pick up a kit from our lab. - Fecal occult blood, imunochemical([facility]); Future   Follow Up Instructions: 4 mths   I discussed the assessment and treatment plan with the patient. The patient was provided an opportunity to ask questions and all were answered. The patient agreed with the plan and demonstrated an understanding of the instructions.   The patient was advised to call back or seek an in-person evaluation if the symptoms worsen or if the condition fails to improve as anticipated.  I  Spent 15 minutes on this telephone encounter  DKarle Plumber MD

## 2021-01-15 ENCOUNTER — Ambulatory Visit: Payer: Self-pay | Attending: Internal Medicine

## 2021-01-15 ENCOUNTER — Other Ambulatory Visit: Payer: Self-pay

## 2021-01-15 ENCOUNTER — Other Ambulatory Visit: Payer: Self-pay | Admitting: Internal Medicine

## 2021-01-15 NOTE — Addendum Note (Signed)
Addended byMemory Dance on: 01/15/2021 10:36 AM   Modules accepted: Orders

## 2021-01-16 LAB — FECAL OCCULT BLOOD, IMMUNOCHEMICAL: Fecal Occult Bld: NEGATIVE

## 2021-01-16 LAB — MICROALBUMIN / CREATININE URINE RATIO
Creatinine, Urine: 25.2 mg/dL
Microalb/Creat Ratio: <12 mg/g creat (ref 0–29)
Microalbumin, Urine: <3 ug/mL

## 2021-02-03 ENCOUNTER — Other Ambulatory Visit: Payer: Self-pay

## 2021-02-04 ENCOUNTER — Other Ambulatory Visit: Payer: Self-pay

## 2021-03-04 ENCOUNTER — Other Ambulatory Visit: Payer: Self-pay

## 2021-03-05 ENCOUNTER — Other Ambulatory Visit: Payer: Self-pay

## 2021-03-11 ENCOUNTER — Other Ambulatory Visit: Payer: Self-pay

## 2021-03-12 ENCOUNTER — Other Ambulatory Visit: Payer: Self-pay

## 2021-03-14 ENCOUNTER — Other Ambulatory Visit: Payer: Self-pay

## 2021-04-02 ENCOUNTER — Other Ambulatory Visit: Payer: Self-pay

## 2021-04-04 ENCOUNTER — Other Ambulatory Visit: Payer: Self-pay

## 2021-04-11 ENCOUNTER — Other Ambulatory Visit: Payer: Self-pay

## 2021-04-22 ENCOUNTER — Ambulatory Visit: Payer: No Typology Code available for payment source

## 2021-05-05 ENCOUNTER — Other Ambulatory Visit: Payer: Self-pay

## 2021-05-06 ENCOUNTER — Other Ambulatory Visit: Payer: Self-pay

## 2021-05-06 ENCOUNTER — Ambulatory Visit: Payer: No Typology Code available for payment source | Attending: Internal Medicine

## 2021-05-13 ENCOUNTER — Other Ambulatory Visit: Payer: Self-pay

## 2021-05-14 ENCOUNTER — Other Ambulatory Visit: Payer: Self-pay

## 2021-05-29 ENCOUNTER — Other Ambulatory Visit: Payer: Self-pay

## 2021-06-03 ENCOUNTER — Other Ambulatory Visit: Payer: Self-pay

## 2021-06-03 ENCOUNTER — Ambulatory Visit: Payer: Self-pay | Attending: Internal Medicine | Admitting: Internal Medicine

## 2021-06-03 ENCOUNTER — Encounter: Payer: Self-pay | Admitting: Internal Medicine

## 2021-06-03 VITALS — BP 130/80 | HR 90 | Resp 16 | Wt 152.0 lb

## 2021-06-03 DIAGNOSIS — E119 Type 2 diabetes mellitus without complications: Secondary | ICD-10-CM

## 2021-06-03 DIAGNOSIS — E1169 Type 2 diabetes mellitus with other specified complication: Secondary | ICD-10-CM

## 2021-06-03 DIAGNOSIS — I152 Hypertension secondary to endocrine disorders: Secondary | ICD-10-CM

## 2021-06-03 DIAGNOSIS — E785 Hyperlipidemia, unspecified: Secondary | ICD-10-CM

## 2021-06-03 DIAGNOSIS — Z23 Encounter for immunization: Secondary | ICD-10-CM

## 2021-06-03 DIAGNOSIS — B079 Viral wart, unspecified: Secondary | ICD-10-CM

## 2021-06-03 DIAGNOSIS — Z1159 Encounter for screening for other viral diseases: Secondary | ICD-10-CM

## 2021-06-03 DIAGNOSIS — E663 Overweight: Secondary | ICD-10-CM

## 2021-06-03 DIAGNOSIS — E1159 Type 2 diabetes mellitus with other circulatory complications: Secondary | ICD-10-CM

## 2021-06-03 LAB — POCT GLYCOSYLATED HEMOGLOBIN (HGB A1C): HbA1c, POC (controlled diabetic range): 7.7 % — AB (ref 0.0–7.0)

## 2021-06-03 LAB — GLUCOSE, POCT (MANUAL RESULT ENTRY): POC Glucose: 196 mg/dl — AB (ref 70–99)

## 2021-06-03 MED ORDER — GLIMEPIRIDE 1 MG PO TABS
1.0000 mg | ORAL_TABLET | Freq: Every day | ORAL | 4 refills | Status: DC
  Filled 2021-06-03: qty 30, 30d supply, fill #0
  Filled 2021-06-29: qty 30, 30d supply, fill #1
  Filled 2021-08-06: qty 30, 30d supply, fill #2
  Filled 2021-09-07: qty 30, 30d supply, fill #3
  Filled 2021-09-08: qty 30, 30d supply, fill #0
  Filled 2021-09-30: qty 30, 30d supply, fill #1

## 2021-06-03 NOTE — Progress Notes (Signed)
Patient ID: Connie Russell, female    DOB: Sep 09, 1970  MRN: [id number]  CC: Diabetes and Hypertension   Subjective: Connie Russell is a 50 y.o. female who presents for chronic ds management Her concerns today include:  Patient with history of diabetes type 2, HTN, HL, constipation, vitamin D deficiency.   DIABETES TYPE 2 Last A1C:   Results for orders placed or performed in visit on 06/03/21  POCT glucose (manual entry)  Result Value Ref Range   POC Glucose 196 (A) 70 - 99 mg/dl  POCT glycosylated hemoglobin (Hb A1C)  Result Value Ref Range   Hemoglobin A1C     HbA1c POC (<> result, manual entry)     HbA1c, POC (prediabetic range)     HbA1c, POC (controlled diabetic range) 7.7 (A) 0.0 - 7.0 %   A1C increased from when last checked in March when it was 6.3. Med Adherence:  [] Yes    [x] No Taking Metformin consistently but not Januvia.  She feels the Januvia does not sit well on her stomach.  No diarrhea.  Medication side effects:  [x] Yes    [] No Home Monitoring?  [x] Yes - 2-3x/wk    [] No Home glucose results range: 107-147 Diet Adherence: [x] Yes - staying away from sugary drinks    [] No Exercise: [x] Yes  - zumba and abdominal exercises. Gained 8 lbs since 10/2020.  BC changed to what sounds like IUD through HD.  She feels it has contributed to wgh gain.  She was on Micronor but she said they felt it was affecting her blood pressure.   Hypoglycemic episodes?: [] Yes    [x] No Numbness of the feet? [] Yes    [x] No Retinopathy hx? [] Yes    [] No Last eye exam: over due.  No insurance.  Pain in LT eye at times Comments:   HTN:  compliant with Norvasc and Losartan.  Took already for the a.m Checks BP intermittently at home.  Her daughter checks for her and states she is told that readings are good but she does not have a log.  HL:  taking and tolerating Lipitor  Complains of having a wart on her right thumb which she has had for several months now.  It is  increasing in size.  She has used some over-the-counter wart remover but it has not helped.  HM:  agrees for flu vaccine and Prevnar 20.  Gets PAP through HD and last one was about 1 yr ago.   Patient Active Problem List   Diagnosis Date Noted   Umbilical hernia without obstruction and without gangrene    Benign paroxysmal positional vertigo 06/04/2017   Diabetes mellitus type 2, controlled ([place]) 03/18/2016   Gastritis 09/16/2015   Dyspepsia 06/13/2015   Constipation 01/15/2014   GERD (gastroesophageal reflux disease) 01/15/2014   Hypertension, essential 08/14/2013   Other and unspecified hyperlipidemia 08/14/2013   Preventative health care 07/14/2013     Current Outpatient Medications on File Prior to Visit  Medication Sig Dispense Refill   amLODipine (NORVASC) 5 MG tablet TAKE 1 TABLET (5 MG TOTAL) BY MOUTH DAILY. 90 tablet 3   Ascorbic Acid (VITAMIN C) 100 MG tablet Take 100 mg by mouth daily.     aspirin EC 81 MG tablet Take 1 tablet (81 mg total) by mouth daily. 30 tablet 11   atorvastatin (LIPITOR) 10 MG tablet TAKE 1 TABLET (10 MG TOTAL) BY MOUTH DAILY. [name] 3   Blood Glucose Monitoring Suppl (TRUE METRIX METER) w/Device KIT Use as directed 1 kit 0   glucose blood (TRUE METRIX BLOOD GLUCOSE TEST) test strip Use as instructed 100 each 12   ibuprofen (ADVIL) 600 MG tablet Take 1 tablet (600 mg total) by mouth every 8 (eight) hours as needed for mild pain or moderate pain. 60 tablet 1   losartan (COZAAR) 100 MG tablet Take 1 tablet (138m) by mouth once daily. 90 tablet 3   metFORMIN (GLUCOPHAGE) 1000 MG tablet TAKE 1 TABLET (1,000 MG TOTAL) BY MOUTH 2 (TWO) TIMES DAILY WITH A MEAL. 180 tablet 3   Moringa Oleifera (MORINGA PO) Take 1 tablet by mouth daily.     TRUEplus Lancets 28G MISC Use as directed 100 each 6   Vitamin D, Cholecalciferol, 400 units CAPS Take 400 Units by mouth every morning. 90 capsule 0   No current facility-administered medications on file prior to  visit.    Allergies  Allergen Reactions   Linzess [Linaclotide] Diarrhea    DIARRHEA AT 145 MICROG    Social History   Socioeconomic History   Marital status: Single    Spouse name: Not on file   Number of children: Not on file   Years of education: Not on file   Highest education level: Not on file  Occupational History   Not on file  Tobacco Use   Smoking status: Never   Smokeless tobacco: Never   Tobacco comments:    Never smoked  Vaping Use   Vaping Use: Never used  Substance and Sexual Activity   Alcohol use: No    Alcohol/week: 0.0 standard drinks   Drug use: No   Sexual activity: Yes    Birth control/protection: Pill, None  Other Topics Concern   Not on file  Social History Narrative   ** Merged History Encounter **       [place] IN [place]   Social Determinants of Health   Financial Resource Strain: Not on file  Food Insecurity: Not on file  Transportation Needs: Not on file  Physical Activity: Not on file  Stress: Not on file  Social Connections: Not on file  Intimate Partner Violence: Not on file    Family History  Problem Relation Age of Onset   Hypertension Mother    Arthritis Mother    Diabetes Maternal Grandmother    Colon cancer Neg Hx     Past Surgical History:  Procedure Laterality Date   ESOPHAGOGASTRODUODENOSCOPY N/A 06/24/2015   SLF: 1. Abdominal pain most likely due to constipation, GERD, and Gastritis. 2. MIld NON-erosive gastritis.   FOOT SURGERY Left 2000   Local   PLANTAR'S WART EXCISION     UMBILICAL HERNIA REPAIR N/A 04/17/2020   Procedure: HERNIA REPAIR UMBILICAL ADULT, Open WITH MESH;  Surgeon: POlean Ree MD;  Location: [facility] ORS;  Service: General;  Laterality: N/A;    ROS: Review of Systems Negative except as stated above  PHYSICAL EXAM: BP 130/80   Pulse 90   Resp 16   Wt 152 lb (68.9 kg)   SpO2 100%   BMI 26.93 kg/m   Wt Readings from Last 3 Encounters:  06/03/21 152 lb (68.9 kg)  10/17/20 144  lb 12.8 oz (65.7 kg)  05/01/20 143 lb 12.8 oz (65.2 kg)    Physical Exam  General appearance - alert, well appearing, middle-aged Hispanic female and in no distress Mental status - normal mood, behavior, speech, dress, motor activity,  and thought processes Neck - supple, no significant adenopathy Chest - clear to auscultation, no wheezes, rales or rhonchi, symmetric air entry Heart - normal rate, regular rhythm, normal S1, S2, no murmurs, rubs, clicks or gallops Extremities - peripheral pulses normal, no pedal edema, no clubbing or cyanosis Skin:  small wart palmar surface RT thumb above the PIP jt Diabetic Foot Exam - Simple   Simple Foot Form Visual Inspection No deformities, no ulcerations, no other skin breakdown bilaterally: Yes Sensation Testing Intact to touch and monofilament testing bilaterally: Yes Pulse Check Posterior Tibialis and Dorsalis pulse intact bilaterally: Yes Comments      CMP Latest Ref Rng & Units 12/09/2020 10/17/2020 04/12/2020  Glucose 65 - 99 mg/dL 109(H) 115(H) 161(H)  BUN 6 - 24 mg/dL _0 Creatinine 0.57 - 1.00 mg/dL 0.53(L) 0.52(L) 0.56(L)  Sodium 134 - 144 mmol/L 139 139 138  Potassium 3.5 - 5.2 mmol/L 4.3 4.5 4.7  Chloride 96 - 106 mmol/L 101 102 100  CO2 20 - 29 mmol/L _1 Calcium 8.7 - 10.2 mg/dL 9.4 9.8 9.5  Total Protein 6.0 - 8.5 g/dL 7.2 7.3 7.0  Total Bilirubin 0.0 - 1.2 mg/dL 0.6 0.3 0.3  Alkaline Phos 44 - 121 IU/L 59 70 73  AST 0 - 40 IU/L _2 ALT 0 - 32 IU/L _3 Lipid Panel     Component Value Date/Time   CHOL 134 10/17/2020 1630   TRIG 188 (H) 10/17/2020 1630   HDL 49 10/17/2020 1630   CHOLHDL 2.7 10/17/2020 1630   CHOLHDL 4.3 09/09/2015 0942   VLDL 23 09/09/2015 0942   LDLCALC 54 10/17/2020 1630    CBC    Component Value Date/Time   WBC 10.0 04/12/2020 1118   WBC 5.8 03/04/2017 1244   RBC 4.77 04/12/2020 1118   RBC 4.40 03/04/2017 1244   HGB 15.1 04/12/2020 1118   HCT 44.6 04/12/2020 1118    PLT 307 04/12/2020 1118   MCV 94 04/12/2020 1118   MCH 31.7 04/12/2020 1118   MCH 30.9 03/04/2017 1244   MCHC 33.9 04/12/2020 1118   MCHC 35.3 03/04/2017 1244   RDW 11.9 04/12/2020 1118   LYMPHSABS 3.6 (H) 10/20/2019 0944   MONOABS 0.2 03/04/2017 1244   EOSABS 0.2 10/20/2019 0944   BASOSABS 0.1 10/20/2019 0944    ASSESSMENT AND PLAN: 1. Type 2 diabetes mellitus without complication, without long-term current use of insulin (HCC) Not at goal.  Patient has not been taking Januvia because she states it upsets her stomach.  She will continue metformin.  We have added low-dose of Amaryl instead.  Encouraged her to continue healthy eating habits and regular exercise. - POCT glucose (manual entry) - POCT glycosylated hemoglobin (Hb A1C)  2. Hypertension associated with type 2 diabetes mellitus ([place]) At goal.  Continue amlodipine and Cozaar  3. Hyperlipidemia associated with type 2 diabetes mellitus (HCC) Continue Lipitor.  4. Overweight (BMI 25.0-29.9) See #1 above.  5. Viral wart on finger - Ambulatory referral to Dermatology  6. Need for immunization against influenza - Flu Vaccine QUAD 61moIM (Fluarix, Fluzone & Alfiuria Quad PF)  7. Need for vaccination against Streptococcus pneumoniae Given Prevnar 20.  8. Need for hepatitis C screening test - HCV Ab w Reflex to Quant PCR  Patient to sign release for [place] get the Pap report from the health department.   Patient was given the opportunity to ask questions.  Patient verbalized  understanding of the plan and was able to repeat key elements of the plan.  [employer] Language interpreter used during this encounter. #[id number], Luanna Salk  Orders Placed This Encounter  Procedures   Flu Vaccine QUAD 21moIM (Fluarix, Fluzone & Alfiuria Quad PF)   HCV Ab w Reflex to Quant PCR   Ambulatory referral to Dermatology   POCT glucose (manual entry)   POCT glycosylated hemoglobin (Hb A1C)     Requested Prescriptions   Signed Prescriptions  Disp Refills   glimepiride (AMARYL) 1 MG tablet 30 tablet 4    Sig: Take 1 tablet (1 mg total) by mouth daily with breakfast.    Return in about 4 months (around 10/04/2021) for Sign release to get last PAP smear report from Health Department.  DKarle Plumber MD, FACP

## 2021-06-04 ENCOUNTER — Other Ambulatory Visit: Payer: Self-pay

## 2021-06-04 LAB — HCV INTERPRETATION

## 2021-06-04 LAB — HCV AB W REFLEX TO QUANT PCR: HCV Ab: <0.1 s/co ratio (ref 0.0–0.9)

## 2021-06-10 ENCOUNTER — Other Ambulatory Visit: Payer: Self-pay | Admitting: Internal Medicine

## 2021-06-10 ENCOUNTER — Other Ambulatory Visit: Payer: Self-pay

## 2021-06-10 DIAGNOSIS — Z1231 Encounter for screening mammogram for malignant neoplasm of breast: Secondary | ICD-10-CM

## 2021-06-13 ENCOUNTER — Other Ambulatory Visit: Payer: Self-pay

## 2021-06-30 ENCOUNTER — Other Ambulatory Visit: Payer: Self-pay

## 2021-07-15 ENCOUNTER — Other Ambulatory Visit: Payer: Self-pay

## 2021-07-17 ENCOUNTER — Other Ambulatory Visit: Payer: Self-pay

## 2021-07-22 ENCOUNTER — Ambulatory Visit: Payer: Self-pay

## 2021-07-22 NOTE — Telephone Encounter (Signed)
°  Chief Complaint: medication reaction with cholesterol and diabetes Symptoms: pt feels like medication is getting stuck in chest and feels SOB or hard to catch breath Frequency: 3-5 days Pertinent Negatives: Patient denies taking meds on empty stomach Disposition: [] ED /[] Urgent Care (no appt availability in office) / [] Appointment(In office/virtual)/ []  [facility]/ [] Home Care/ [x] Mobile unit [] Refused Recommended Disposition  Additional Notes: no available appts in office so was able to provide pt with location and address to mobile unit and advised her to call back if needed additional information. Pt verbalized understanding    Reason for Disposition  [1] Caller has NON-URGENT medicine question about med that PCP prescribed AND [2] triager unable to answer question  Answer Assessment - Initial Assessment Questions 1. NAME of MEDICATION: "What medicine are you calling about?"     Cholesterol and diabetes med 2. QUESTION: "What is your question?" (e.g., double dose of medicine, side effect)     After taking medication, feels like it gets stuff in chest and hard to catch breathe and feel SOB 3. PRESCRIBING HCP: "Who prescribed it?" Reason: if prescribed by specialist, call should be referred to that group.    Dr. 4. SYMPTOMS: "Do you have any symptoms?"     After taking medication, feels like it gets stuff in chest and hard to catch breathe and feel SOB  Protocols used: Medication Question Call-A-AH

## 2021-08-06 ENCOUNTER — Other Ambulatory Visit: Payer: Self-pay

## 2021-08-07 ENCOUNTER — Other Ambulatory Visit: Payer: Self-pay

## 2021-08-15 ENCOUNTER — Other Ambulatory Visit: Payer: Self-pay

## 2021-08-18 ENCOUNTER — Other Ambulatory Visit: Payer: Self-pay

## 2021-09-08 ENCOUNTER — Other Ambulatory Visit: Payer: Self-pay

## 2021-09-16 ENCOUNTER — Other Ambulatory Visit: Payer: Self-pay

## 2021-09-30 ENCOUNTER — Other Ambulatory Visit: Payer: Self-pay

## 2021-10-02 ENCOUNTER — Other Ambulatory Visit: Payer: Self-pay

## 2021-10-06 ENCOUNTER — Ambulatory Visit: Payer: Self-pay | Attending: Internal Medicine | Admitting: Internal Medicine

## 2021-10-06 DIAGNOSIS — R109 Unspecified abdominal pain: Secondary | ICD-10-CM

## 2021-10-06 NOTE — Progress Notes (Signed)
Patient ID: Connie Russell, female   DOB: [birth year], 51 y.o.   MRN: [id number] Virtual Visit via Telephone Note  I connected with Amoy Steeves Aguilar-Reyes on 10/06/2021 at 4:19 PM by telephone and verified that I am speaking with the correct person using two identifiers  Location: Patient: home Provider: office  Participants: Myself Patient [place] interpreter: [phone], Landry Mellow   I discussed the limitations, risks, security and privacy concerns of performing an evaluation and management service by telephone and the availability of in person appointments. I also discussed with the patient that there may be a patient responsible charge related to this service. The patient expressed understanding and agreed to proceed.   History of Present Illness: Patient with history of diabetes type 2, HTN, HL, constipation, vitamin D deficiency.   C/o having diarrhea last night that resolved Woke this a.m with pain in upper abdomen. Pain is intermittent. Lasting about 20 mins with sweating. Goes away then comes back. Vomited around 3 a.m this a.m but nothing since and no BM since she woke up this morning.  Endorses abdomen feeling swollen.  No fever Currently rates pain as 9/10.  Had to leave work today because of the pain.  She has not eaten all day because of the pain and fear that eating will make it worse.      Outpatient Encounter Medications as of 10/06/2021  Medication Sig   amLODipine (NORVASC) 5 MG tablet TAKE 1 TABLET (5 MG TOTAL) BY MOUTH DAILY.   Ascorbic Acid (VITAMIN C) 100 MG tablet Take 100 mg by mouth daily.   aspirin EC 81 MG tablet Take 1 tablet (81 mg total) by mouth daily.   atorvastatin (LIPITOR) 10 MG tablet TAKE 1 TABLET (10 MG TOTAL) BY MOUTH DAILY.   Blood Glucose Monitoring Suppl (TRUE METRIX METER) w/Device KIT Use as directed   glimepiride (AMARYL) 1 MG tablet Take 1 tablet (1 mg total) by mouth daily with breakfast.   glucose blood (TRUE METRIX BLOOD GLUCOSE TEST) test  strip Use as instructed   ibuprofen (ADVIL) 600 MG tablet Take 1 tablet (600 mg total) by mouth every 8 (eight) hours as needed for mild pain or moderate pain.   losartan (COZAAR) 100 MG tablet Take 1 tablet (118m) by mouth once daily.   metFORMIN (GLUCOPHAGE) 1000 MG tablet TAKE 1 TABLET (1,000 MG TOTAL) BY MOUTH 2 (TWO) TIMES DAILY WITH A MEAL.   Moringa Oleifera (MORINGA PO) Take 1 tablet by mouth daily.   TRUEplus Lancets 28G MISC Use as directed   Vitamin D, Cholecalciferol, 400 units CAPS Take 400 Units by mouth every morning.   No facility-administered encounter medications on file as of 10/06/2021.    Observations/Objective: Pt sounded distressed and uncomfortable  Assessment and Plan: 1. Acute abdominal pain -pt sounds as though she is in moderate distress from abdominal pain.  Advised to be seen in the emergency room   Follow Up Instructions: F/u post ER for chronic ds management   I discussed the assessment and treatment plan with the patient. The patient was provided an opportunity to ask questions and all were answered. The patient agreed with the plan and demonstrated an understanding of the instructions.   The patient was advised to call back or seek an in-person evaluation if the symptoms worsen or if the condition fails to improve as anticipated.  I  Spent 10 minutes on this telephone encounter  This note has been created with DSurveyor, quantity  Any transcriptional errors are unintentional.  Karle Plumber, MD

## 2021-10-10 ENCOUNTER — Other Ambulatory Visit: Payer: Self-pay

## 2021-10-13 ENCOUNTER — Other Ambulatory Visit: Payer: Self-pay

## 2021-11-02 ENCOUNTER — Other Ambulatory Visit: Payer: Self-pay | Admitting: Internal Medicine

## 2021-11-03 ENCOUNTER — Other Ambulatory Visit: Payer: Self-pay

## 2021-11-04 ENCOUNTER — Other Ambulatory Visit: Payer: Self-pay

## 2021-11-04 MED ORDER — GLIMEPIRIDE 1 MG PO TABS
1.0000 mg | ORAL_TABLET | Freq: Every day | ORAL | 0 refills | Status: DC
  Filled 2021-11-04: qty 30, 30d supply, fill #0

## 2021-11-06 ENCOUNTER — Other Ambulatory Visit: Payer: Self-pay

## 2021-11-10 ENCOUNTER — Other Ambulatory Visit: Payer: Self-pay

## 2021-11-12 ENCOUNTER — Other Ambulatory Visit: Payer: Self-pay

## 2021-12-07 ENCOUNTER — Other Ambulatory Visit: Payer: Self-pay | Admitting: Internal Medicine

## 2021-12-08 ENCOUNTER — Other Ambulatory Visit: Payer: Self-pay

## 2021-12-08 MED ORDER — GLIMEPIRIDE 1 MG PO TABS
1.0000 mg | ORAL_TABLET | Freq: Every day | ORAL | 0 refills | Status: DC
  Filled 2021-12-08: qty 30, 30d supply, fill #0

## 2021-12-16 ENCOUNTER — Other Ambulatory Visit: Payer: Self-pay

## 2022-01-04 ENCOUNTER — Other Ambulatory Visit: Payer: Self-pay | Admitting: Internal Medicine

## 2022-01-06 ENCOUNTER — Other Ambulatory Visit: Payer: Self-pay

## 2022-01-08 ENCOUNTER — Other Ambulatory Visit: Payer: Self-pay

## 2022-01-08 ENCOUNTER — Telehealth: Payer: Self-pay | Admitting: Internal Medicine

## 2022-01-08 DIAGNOSIS — E119 Type 2 diabetes mellitus without complications: Secondary | ICD-10-CM

## 2022-01-08 NOTE — Telephone Encounter (Signed)
Patient came in stating that she is out of her glimepiride 1 MG and asked if she can get refill until next appointment.

## 2022-01-12 ENCOUNTER — Other Ambulatory Visit: Payer: Self-pay

## 2022-01-12 MED ORDER — GLIMEPIRIDE 1 MG PO TABS
1.0000 mg | ORAL_TABLET | Freq: Every day | ORAL | 0 refills | Status: DC
  Filled 2022-01-12: qty 30, 30d supply, fill #0

## 2022-01-12 NOTE — Telephone Encounter (Signed)
Sent rx but must have OV for additional refills

## 2022-01-14 ENCOUNTER — Other Ambulatory Visit: Payer: Self-pay

## 2022-01-19 ENCOUNTER — Other Ambulatory Visit: Payer: Self-pay | Admitting: Internal Medicine

## 2022-01-19 DIAGNOSIS — E1169 Type 2 diabetes mellitus with other specified complication: Secondary | ICD-10-CM

## 2022-01-19 DIAGNOSIS — E1159 Type 2 diabetes mellitus with other circulatory complications: Secondary | ICD-10-CM

## 2022-01-20 ENCOUNTER — Other Ambulatory Visit: Payer: Self-pay

## 2022-01-20 MED ORDER — METFORMIN HCL 1000 MG PO TABS
ORAL_TABLET | Freq: Two times a day (BID) | ORAL | 0 refills | Status: DC
  Filled 2022-01-20: qty 60, 30d supply, fill #0

## 2022-01-20 MED ORDER — ATORVASTATIN CALCIUM 10 MG PO TABS
ORAL_TABLET | Freq: Every day | ORAL | 0 refills | Status: DC
  Filled 2022-01-20: qty 30, 30d supply, fill #0

## 2022-01-20 NOTE — Telephone Encounter (Signed)
Patient has appointment 02/05/22- courtesy RF grant even though Rx expired- 30 day Requested Prescriptions  Pending Prescriptions Disp Refills  . metFORMIN (GLUCOPHAGE) 1000 MG tablet 180 tablet 3    Sig: TAKE 1 TABLET (1,000 MG TOTAL) BY MOUTH 2 (TWO) TIMES DAILY WITH A MEAL.     Endocrinology:  Diabetes - Biguanides Failed - 01/19/2022 11:59 PM      Failed - Cr in normal range and within 360 days    Creat  Date Value Ref Range Status  03/18/2016 0.61 0.50 - 1.10 mg/dL Final   Creatinine, Ser  Date Value Ref Range Status  12/09/2020 0.53 (L) 0.57 - 1.00 mg/dL Final         Failed - HBA1C is between 0 and 7.9 and within 180 days    HbA1c, POC (controlled diabetic range)  Date Value Ref Range Status  06/03/2021 7.7 (A) 0.0 - 7.0 % Final         Failed - eGFR in normal range and within 360 days    GFR, Est African American  Date Value Ref Range Status  03/18/2016 >89 >=60 mL/min Final   GFR calc Af Amer  Date Value Ref Range Status  04/12/2020 128 >59 mL/min/1.73 Final    Comment:    **[facility] currently reports eGFR in compliance with the current**   recommendations of the [employer]. [facility] will   update reporting as new guidelines are published from the NKF-ASN   Task force.    GFR, Est Non African American  Date Value Ref Range Status  03/18/2016 >89 >=60 mL/min Final   GFR calc non Af Amer  Date Value Ref Range Status  04/12/2020 111 >59 mL/min/1.73 Final   eGFR  Date Value Ref Range Status  12/09/2020 114 >59 mL/min/1.73 Final         Failed - B12 Level in normal range and within 720 days    Vitamin B-12  Date Value Ref Range Status  01/15/2014 665 211 - 911 pg/mL Final         Failed - Valid encounter within last 6 months    Recent Outpatient Visits          3 months ago Acute abdominal pain   [place], Neoma Laming B, MD   7 months ago Type 2 diabetes mellitus without complication, without long-term  current use of insulin ([place])   [place] Ladell Pier, MD   1 year ago Screening for colon cancer   [place], Deborah B, MD   1 year ago Hypertension, essential   [place], Jarome Matin, RPH-CPP   1 year ago Hypertension, essential   [place], RPH-CPP      Future Appointments            In 2 weeks Argentina Donovan, PA-C [place]           Failed - CBC within normal limits and completed in the last 12 months    WBC  Date Value Ref Range Status  04/12/2020 10.0 3.4 - 10.8 x10E3/uL Final  03/04/2017 5.8 4.0 - 10.5 K/uL Final   RBC  Date Value Ref Range Status  04/12/2020 4.77 3.77 - 5.28 x10E6/uL Final  03/04/2017 4.40 3.87 - 5.11 MIL/uL Final   Hemoglobin  Date Value Ref  Range Status  04/12/2020 15.1 11.1 - 15.9 g/dL Final   Hematocrit  Date Value Ref Range Status  04/12/2020 44.6 34.0 - 46.6 % Final   MCHC  Date Value Ref Range Status  04/12/2020 33.9 31.5 - 35.7 g/dL Final  03/04/2017 35.3 30.0 - 36.0 g/dL Final   [facility]  Date Value Ref Range Status  04/12/2020 31.7 26.6 - 33.0 pg Final  03/04/2017 30.9 26.0 - 34.0 pg Final   MCV  Date Value Ref Range Status  04/12/2020 94 79 - 97 fL Final   No results found for: "PLTCOUNTKUC", "LABPLAT", "POCPLA" RDW  Date Value Ref Range Status  04/12/2020 11.9 11.7 - 15.4 % Final         . atorvastatin (LIPITOR) 10 MG tablet 90 tablet 3    Sig: TAKE 1 TABLET (10 MG TOTAL) BY MOUTH DAILY.     Cardiovascular:  Antilipid - Statins Failed - 01/19/2022 11:59 PM      Failed - Lipid Panel in normal range within the last 12 months    Cholesterol, Total  Date Value Ref Range Status  10/17/2020 134 100 - 199 mg/dL Final   LDL Chol Calc (NIH)  Date Value Ref Range Status  10/17/2020 54 0 - 99 mg/dL Final   HDL  Date  Value Ref Range Status  10/17/2020 49 >39 mg/dL Final   Triglycerides  Date Value Ref Range Status  10/17/2020 188 (H) 0 - 149 mg/dL Final         Passed - Patient is not pregnant      Passed - Valid encounter within last 12 months    Recent Outpatient Visits          3 months ago Acute abdominal pain   [place], Neoma Laming B, MD   7 months ago Type 2 diabetes mellitus without complication, without long-term current use of insulin ([place])   [place], Deborah B, MD   1 year ago Screening for colon cancer   [place], Deborah B, MD   1 year ago Hypertension, essential   [place], Stephen L, RPH-CPP   1 year ago Hypertension, essential   [place], RPH-CPP      Future Appointments            In 2 weeks [place] Solo, Dionne Bucy, PA-C [place]

## 2022-01-21 ENCOUNTER — Other Ambulatory Visit: Payer: Self-pay

## 2022-01-25 ENCOUNTER — Other Ambulatory Visit: Payer: Self-pay | Admitting: Internal Medicine

## 2022-01-25 DIAGNOSIS — I152 Hypertension secondary to endocrine disorders: Secondary | ICD-10-CM

## 2022-01-26 ENCOUNTER — Other Ambulatory Visit: Payer: Self-pay

## 2022-01-26 MED ORDER — AMLODIPINE BESYLATE 5 MG PO TABS
ORAL_TABLET | Freq: Every day | ORAL | 0 refills | Status: DC
  Filled 2022-01-26: qty 90, 90d supply, fill #0

## 2022-01-26 NOTE — Telephone Encounter (Signed)
Requested Prescriptions  Pending Prescriptions Disp Refills  . amLODipine (NORVASC) 5 MG tablet 90 tablet 0    Sig: TAKE 1 TABLET (5 MG TOTAL) BY MOUTH DAILY.     Cardiovascular: Calcium Channel Blockers 2 Failed - 01/25/2022 10:12 PM      Failed - Valid encounter within last 6 months    Recent Outpatient Visits          3 months ago Acute abdominal pain   [place] [facility] [place], Gavin Pound B, MD   7 months ago Type 2 diabetes mellitus without complication, without long-term current use of insulin (HCC)   [place] [facility] Marcine Matar, MD   1 year ago Screening for colon cancer   [facility] Marcine Matar, MD   1 year ago Hypertension, essential   [facility] Drucilla Chalet, RPH-CPP   1 year ago Hypertension, essential   [facility] Lois Huxley, Cornelius Moras, RPH-CPP      Future Appointments            In 1 week Anders Simmonds, PA-C [place] [facility]           Passed - Last BP in normal range    BP Readings from Last 1 Encounters:  06/03/21 130/80         Passed - Last Heart Rate in normal range    Pulse Readings from Last 1 Encounters:  06/03/21 90

## 2022-02-05 ENCOUNTER — Ambulatory Visit: Payer: 59 | Attending: Physician Assistant | Admitting: Physician Assistant

## 2022-02-05 ENCOUNTER — Other Ambulatory Visit: Payer: Self-pay

## 2022-02-05 ENCOUNTER — Encounter: Payer: Self-pay | Admitting: Physician Assistant

## 2022-02-05 VITALS — BP 129/83 | HR 60 | Wt 150.6 lb

## 2022-02-05 DIAGNOSIS — L299 Pruritus, unspecified: Secondary | ICD-10-CM | POA: Diagnosis not present

## 2022-02-05 DIAGNOSIS — E119 Type 2 diabetes mellitus without complications: Secondary | ICD-10-CM

## 2022-02-05 DIAGNOSIS — E1169 Type 2 diabetes mellitus with other specified complication: Secondary | ICD-10-CM | POA: Diagnosis not present

## 2022-02-05 DIAGNOSIS — Z1239 Encounter for other screening for malignant neoplasm of breast: Secondary | ICD-10-CM

## 2022-02-05 DIAGNOSIS — E785 Hyperlipidemia, unspecified: Secondary | ICD-10-CM

## 2022-02-05 DIAGNOSIS — Z789 Other specified health status: Secondary | ICD-10-CM

## 2022-02-05 DIAGNOSIS — E1159 Type 2 diabetes mellitus with other circulatory complications: Secondary | ICD-10-CM

## 2022-02-05 DIAGNOSIS — I152 Hypertension secondary to endocrine disorders: Secondary | ICD-10-CM

## 2022-02-05 LAB — POCT GLYCOSYLATED HEMOGLOBIN (HGB A1C): HbA1c, POC (controlled diabetic range): 7.9 % — AB (ref 0.0–7.0)

## 2022-02-05 LAB — GLUCOSE, POCT (MANUAL RESULT ENTRY): POC Glucose: 204 mg/dl — AB (ref 70–99)

## 2022-02-05 MED ORDER — CETIRIZINE HCL 10 MG PO TABS
10.0000 mg | ORAL_TABLET | Freq: Every day | ORAL | 11 refills | Status: DC
  Filled 2022-02-05: qty 30, 30d supply, fill #0

## 2022-02-05 MED ORDER — AMLODIPINE BESYLATE 5 MG PO TABS
5.0000 mg | ORAL_TABLET | Freq: Every day | ORAL | 1 refills | Status: DC
  Filled 2022-02-05: qty 90, fill #0
  Filled 2022-04-26: qty 30, 30d supply, fill #0
  Filled 2022-05-24: qty 30, 30d supply, fill #1
  Filled 2022-06-21: qty 30, 30d supply, fill #2
  Filled 2022-07-23: qty 30, 30d supply, fill #3
  Filled 2022-08-23: qty 30, 30d supply, fill #4
  Filled 2022-09-21: qty 30, 30d supply, fill #5

## 2022-02-05 MED ORDER — GLIMEPIRIDE 2 MG PO TABS
2.0000 mg | ORAL_TABLET | Freq: Every day | ORAL | 1 refills | Status: DC
  Filled 2022-02-05: qty 90, 90d supply, fill #0
  Filled 2022-05-13: qty 30, 30d supply, fill #1
  Filled 2022-06-01 – 2022-06-15 (×2): qty 30, 30d supply, fill #2
  Filled 2022-07-09: qty 30, 30d supply, fill #3

## 2022-02-05 MED ORDER — ATORVASTATIN CALCIUM 10 MG PO TABS
ORAL_TABLET | Freq: Every day | ORAL | 1 refills | Status: DC
  Filled 2022-02-05: qty 90, fill #0
  Filled 2022-02-16: qty 90, 90d supply, fill #0
  Filled 2022-06-01: qty 30, 30d supply, fill #1
  Filled 2022-07-09: qty 30, 30d supply, fill #2
  Filled 2022-08-13: qty 30, 30d supply, fill #3

## 2022-02-05 MED ORDER — METFORMIN HCL 1000 MG PO TABS
ORAL_TABLET | Freq: Two times a day (BID) | ORAL | 1 refills | Status: DC
  Filled 2022-02-05: qty 180, fill #0
  Filled 2022-02-16: qty 180, 90d supply, fill #0
  Filled 2022-05-24: qty 60, 30d supply, fill #1
  Filled 2022-06-21: qty 60, 30d supply, fill #2
  Filled 2022-07-23: qty 60, 30d supply, fill #3

## 2022-02-05 MED ORDER — LOSARTAN POTASSIUM 100 MG PO TABS
100.0000 mg | ORAL_TABLET | Freq: Every day | ORAL | 1 refills | Status: DC
  Filled 2022-02-05: qty 90, 90d supply, fill #0
  Filled 2022-05-18: qty 30, 30d supply, fill #1
  Filled 2022-06-15: qty 30, 30d supply, fill #2
  Filled 2022-07-22: qty 30, 30d supply, fill #3

## 2022-02-05 NOTE — Progress Notes (Signed)
Patient ID: Connie Russell, female   DOB: 1971-02-10, 51 y.o.   MRN: [id number]     Connie Russell, is a 51 y.o. female  MCN:[id number]  OQH:[id number]  DOB - 17-Apr-1971  Chief Complaint  Patient presents with   Medication Refill       Subjective:   Connie Russell is a 51 y.o. female here today for med RF.  She does not check blood sugars.  Denies any complaints.  No polyuria/polydipsia.    She got some bug bites doing yard work a couple weeks ago and they are continuing to itch.    No problems updated.  ALLERGIES: Allergies  Allergen Reactions   Linzess [Linaclotide] Diarrhea    DIARRHEA AT 145 MICROG    PAST MEDICAL HISTORY: Past Medical History:  Diagnosis Date   Diabetes ([place])    Diabetes mellitus without complication ([place])    Gastritis    GERD (gastroesophageal reflux disease)    Hyperlipidemia    Hypertension     MEDICATIONS AT HOME: Prior to Admission medications   Medication Sig Start Date End Date Taking? Authorizing Provider  cetirizine (ZYRTEC) 10 MG tablet Take 1 tablet (10 mg total) by mouth daily as needed for itching 02/05/22  Yes Freeman Caldron M, PA-C  glimepiride (AMARYL) 2 MG tablet Take 1 tablet (2 mg total) by mouth daily before breakfast. 02/05/22  Yes Areana Kosanke M, PA-C  amLODipine (NORVASC) 5 MG tablet TAKE 1 TABLET (5 MG TOTAL) BY MOUTH DAILY. 02/05/22 02/05/23  Argentina Donovan, PA-C  Ascorbic Acid (VITAMIN C) 100 MG tablet Take 100 mg by mouth daily. Patient not taking: Reported on 02/05/2022    [provider]  aspirin EC 81 MG tablet Take 1 tablet (81 mg total) by mouth daily. Patient not taking: Reported on 02/05/2022 10/19/19   Argentina Donovan, PA-C  atorvastatin (LIPITOR) 10 MG tablet TAKE 1 TABLET (10 MG TOTAL) BY MOUTH DAILY. 02/05/22   Argentina Donovan, PA-C  Blood Glucose Monitoring Suppl (TRUE METRIX METER) w/Device KIT Use as directed Patient not taking: Reported on 02/05/2022 01/12/19   Ladell Pier, MD  glucose blood (TRUE METRIX BLOOD GLUCOSE TEST) test strip Use as instructed Patient not taking: Reported on 02/05/2022 01/12/19   Ladell Pier, MD  ibuprofen (ADVIL) 600 MG tablet Take 1 tablet (600 mg total) by mouth every 8 (eight) hours as needed for mild pain or moderate pain. Patient not taking: Reported on 02/05/2022 04/17/20   Olean Ree, MD  losartan (COZAAR) 100 MG tablet Take 1 tablet (180m) by mouth once daily. 02/05/22 02/05/23  MArgentina Donovan PA-C  metFORMIN (GLUCOPHAGE) 1000 MG tablet TAKE 1 TABLET (1,000 MG TOTAL) BY MOUTH 2 (TWO) TIMES DAILY WITH A MEAL. 02/05/22   Bryer Cozzolino, ADionne Bucy PA-C  Moringa Oleifera (MORINGA PO) Take 1 tablet by mouth daily. Patient not taking: Reported on 02/05/2022    [provider]  TRUEplus Lancets 28G MISC Use as directed Patient not taking: Reported on 02/05/2022 01/12/19   JLadell Pier MD  Vitamin D, Cholecalciferol, 400 units CAPS Take 400 Units by mouth every morning. Patient not taking: Reported on 02/05/2022 01/01/17   JLadell Pier MD    ROS: Neg HEENT Neg resp Neg cardiac Neg GI Neg GU Neg MS Neg psych Neg neuro  Objective:   Vitals:   02/05/22 1549  BP: 129/83  Pulse: 60  SpO2: 97%  Weight: 150 lb 9.6 oz (68.3 kg)   Exam General  appearance : Awake, alert, not in any distress. Speech Clear. Not toxic looking HEENT: Atraumatic and Normocephalic Neck: Supple, no JVD. No cervical lymphadenopathy.  Chest: Good air entry bilaterally, CTAB.  No rales/rhonchi/wheezing CVS: S1 S2 regular, no murmurs.  Extremities: B/L Lower Ext shows no edema, both legs are warm to touch Neurology: Awake alert, and oriented X 3, CN II-XII intact, Non focal Skin: small bug bite appearing lesions on lower legs that appear to be drying up.  No secondary infection  Data Review Lab Results  Component Value Date   HGBA1C 7.9 (A) 02/05/2022   HGBA1C 7.7 (A) 06/03/2021   HGBA1C 6.3 10/17/2020    Assessment &  Plan   1. Type 2 diabetes mellitus without complication, without long-term current use of insulin (HCC) Not at goal-/worse than previously-will increase glimepiride to 2mg and continue other meds.  Work on diabetic diet - Glucose (CBG) - HgB A1c - glimepiride (AMARYL) 2 MG tablet; Take 1 tablet (2 mg total) by mouth daily before breakfast.  Dispense: 90 tablet; Refill: 1 - metFORMIN (GLUCOPHAGE) 1000 MG tablet; TAKE 1 TABLET (1,000 MG TOTAL) BY MOUTH 2 (TWO) TIMES DAILY WITH A MEAL.  Dispense: 180 tablet; Refill: 1 - losartan (COZAAR) 100 MG tablet; Take 1 tablet (100mg) by mouth once daily.  Dispense: 90 tablet; Refill: 1 - Comprehensive metabolic panel  2. Hypertension associated with type 2 diabetes mellitus (HCC) controlled - amLODipine (NORVASC) 5 MG tablet; TAKE 1 TABLET (5 MG TOTAL) BY MOUTH DAILY.  Dispense: 90 tablet; Refill: 1 - metFORMIN (GLUCOPHAGE) 1000 MG tablet; TAKE 1 TABLET (1,000 MG TOTAL) BY MOUTH 2 (TWO) TIMES DAILY WITH A MEAL.  Dispense: 180 tablet; Refill: 1 - losartan (COZAAR) 100 MG tablet; Take 1 tablet (100mg) by mouth once daily.  Dispense: 90 tablet; Refill: 1 - Comprehensive metabolic panel - CBC with Differential/Platelet  3. Hyperlipidemia associated with type 2 diabetes mellitus (HCC) - atorvastatin (LIPITOR) 10 MG tablet; TAKE 1 TABLET (10 MG TOTAL) BY MOUTH DAILY.  Dispense: 90 tablet; Refill: 1 - Lipid panel  4. Itching - cetirizine (ZYRTEC) 10 MG tablet; Take 1 tablet (10 mg total) by mouth daily as needed for itching  Dispense: 30 tablet; Refill: 11  5. Encounter for screening for malignant neoplasm of breast, unspecified screening modality - MM Digital Screening; Future  6. Language barrier [place] interpreting with [employer]N interpreters used and additional time performing visit was required.     Return in about 3 months (around 05/08/2022) for PCP for chronic conditions.  The patient was given clear instructions to go to ER or return to medical  center if symptoms don't improve, worsen or new problems develop. The patient verbalized understanding. The patient was told to call to get lab results if they haven't heard anything in the next week.      Angela McClung, PA-C [facility] [place], [place] [phone]   02/05/2022, 4:56 PM  

## 2022-02-06 ENCOUNTER — Other Ambulatory Visit: Payer: Self-pay

## 2022-02-06 LAB — COMPREHENSIVE METABOLIC PANEL
ALT: 24 IU/L (ref 0–32)
AST: 19 IU/L (ref 0–40)
Albumin/Globulin Ratio: 2 (ref 1.2–2.2)
Albumin: 4.8 g/dL (ref 3.8–4.8)
Alkaline Phosphatase: 82 IU/L (ref 44–121)
BUN/Creatinine Ratio: 15 (ref 9–23)
BUN: 10 mg/dL (ref 6–24)
Bilirubin Total: 0.3 mg/dL (ref 0.0–1.2)
CO2: 22 mmol/L (ref 20–29)
Calcium: 9.7 mg/dL (ref 8.7–10.2)
Chloride: 100 mmol/L (ref 96–106)
Creatinine, Ser: 0.65 mg/dL (ref 0.57–1.00)
Globulin, Total: 2.4 g/dL (ref 1.5–4.5)
Glucose: 220 mg/dL — ABNORMAL HIGH (ref 70–99)
Potassium: 4.1 mmol/L (ref 3.5–5.2)
Sodium: 137 mmol/L (ref 134–144)
Total Protein: 7.2 g/dL (ref 6.0–8.5)
eGFR: 108 mL/min/{1.73_m2} (ref >59)

## 2022-02-06 LAB — CBC WITH DIFFERENTIAL/PLATELET
Basophils Absolute: 0.1 10*3/uL (ref 0.0–0.2)
Basos: 1 %
EOS (ABSOLUTE): 0.2 10*3/uL (ref 0.0–0.4)
Eos: 2 %
Hematocrit: 39.6 % (ref 34.0–46.6)
Hemoglobin: 13.7 g/dL (ref 11.1–15.9)
Immature Grans (Abs): 0 10*3/uL (ref 0.0–0.1)
Immature Granulocytes: 0 %
Lymphocytes Absolute: 2.6 10*3/uL (ref 0.7–3.1)
Lymphs: 36 %
MCH: 31.1 pg (ref 26.6–33.0)
MCHC: 34.6 g/dL (ref 31.5–35.7)
MCV: 90 fL (ref 79–97)
Monocytes Absolute: 0.4 10*3/uL (ref 0.1–0.9)
Monocytes: 5 %
Neutrophils Absolute: 4 10*3/uL (ref 1.4–7.0)
Neutrophils: 56 %
Platelets: 347 10*3/uL (ref 150–450)
RBC: 4.41 x10E6/uL (ref 3.77–5.28)
RDW: 11.6 % — ABNORMAL LOW (ref 11.7–15.4)
WBC: 7.3 10*3/uL (ref 3.4–10.8)

## 2022-02-06 LAB — LIPID PANEL
Chol/HDL Ratio: 3.1 ratio (ref 0.0–4.4)
Cholesterol, Total: 133 mg/dL (ref 100–199)
HDL: 43 mg/dL (ref >39)
LDL Chol Calc (NIH): 58 mg/dL (ref 0–99)
Triglycerides: 198 mg/dL — ABNORMAL HIGH (ref 0–149)
VLDL Cholesterol Cal: 32 mg/dL (ref 5–40)

## 2022-02-17 ENCOUNTER — Other Ambulatory Visit: Payer: Self-pay

## 2022-02-20 ENCOUNTER — Other Ambulatory Visit: Payer: Self-pay

## 2022-04-27 ENCOUNTER — Other Ambulatory Visit: Payer: Self-pay

## 2022-04-30 ENCOUNTER — Ambulatory Visit (INDEPENDENT_AMBULATORY_CARE_PROVIDER_SITE_OTHER): Payer: Self-pay | Admitting: Family Medicine

## 2022-04-30 VITALS — BP 118/80 | HR 88 | Temp 98.4°F | Resp 99 | Wt 147.4 lb

## 2022-04-30 DIAGNOSIS — B079 Viral wart, unspecified: Secondary | ICD-10-CM

## 2022-04-30 NOTE — Patient Instructions (Addendum)
It was great to see you today! Here's what we talked about:  The area is likely a wart from a virus called HPV. Today, we used a very cold cotton swab to help remove the tissue. Please return in 2 weeks to assess improvement and see if you need another treatment.  Please let me know if you have any other questions.  Dr. Marcha Dutton

## 2022-04-30 NOTE — Progress Notes (Signed)
    SUBJECTIVE:   CHIEF COMPLAINT / HPI:   Wart on finger Has been on right thumb. Has been there for a long time. It is painful when she touches things. She tried some OTC liquid to try and "burn" it off. The medication did not help, though. Has noticed it has continued to grow. No other warts on the body. Does not think she has been in contact with anyone who has a wart like this. Would like it removed.  OBJECTIVE:   BP 118/80   Pulse 88   Temp 98.4 F (36.9 C)   Resp (!) 99   Wt 147 lb 6.4 oz (66.9 kg)   BMI 26.11 kg/m   General: Alert and oriented, in NAD Skin: Warm, dry. 1 cm hypertrophic, warty growth overlying DIP joint, TTP HEENT: NCAT, EOM grossly normal, midline nasal septum Cardiac: Regular rate Respiratory: Breathing and speaking comfortably on RA Abdominal: Nondistended Extremities: Moves all extremities grossly equally Neurological: No gross focal deficit Psychiatric: Appropriate mood and affect      ASSESSMENT/PLAN:   Diagnosis: Viral Wart Procedure: Cryotherapy Location: Right thumb at DIP joint   After discussion of the risks, benefits, and alternative therapies available, the patient elected to proceed. Informed consent obtained. The lesion on the right thumb was treated using a cotton swab dipped in liquid nitrogen for a total of 3 cycles. The patient tolerated the procedure well and there were no immediate complications. Patient was advised to use tylenol if pain persisted and to come back in 2 weeks to assess improvement and the need for further treatments.  Ethelene Hal, MD [place]

## 2022-05-12 ENCOUNTER — Ambulatory Visit: Payer: No Typology Code available for payment source | Attending: Internal Medicine | Admitting: Internal Medicine

## 2022-05-12 VITALS — BP 120/78 | HR 97 | Temp 98.2°F | Ht 63.0 in | Wt 149.0 lb

## 2022-05-12 DIAGNOSIS — Z23 Encounter for immunization: Secondary | ICD-10-CM

## 2022-05-12 DIAGNOSIS — E1159 Type 2 diabetes mellitus with other circulatory complications: Secondary | ICD-10-CM

## 2022-05-12 DIAGNOSIS — I152 Hypertension secondary to endocrine disorders: Secondary | ICD-10-CM

## 2022-05-12 DIAGNOSIS — Z1211 Encounter for screening for malignant neoplasm of colon: Secondary | ICD-10-CM

## 2022-05-12 DIAGNOSIS — E1169 Type 2 diabetes mellitus with other specified complication: Secondary | ICD-10-CM

## 2022-05-12 DIAGNOSIS — E785 Hyperlipidemia, unspecified: Secondary | ICD-10-CM

## 2022-05-12 DIAGNOSIS — E119 Type 2 diabetes mellitus without complications: Secondary | ICD-10-CM

## 2022-05-12 LAB — GLUCOSE, POCT (MANUAL RESULT ENTRY): POC Glucose: 167 mg/dl — AB (ref 70–99)

## 2022-05-12 LAB — POCT GLYCOSYLATED HEMOGLOBIN (HGB A1C): HbA1c, POC (controlled diabetic range): 7.2 % — AB (ref 0.0–7.0)

## 2022-05-12 NOTE — Progress Notes (Signed)
Patient ID: Connie Russell, female    DOB: 07-12-1971  MRN: [id number]  CC: Diabetes (Diabetes & HTN f/u No questions/ concerns. Velta Addison to flu vax)   Subjective: Connie Russell is a 51 y.o. female who presents for chronic ds management Her concerns today include:  Patient with history of diabetes type 2, HTN, HL, constipation, vitamin D deficiency.   DIABETES TYPE 2 Last A1C:   Results for orders placed or performed in visit on 05/12/22  POCT glucose (manual entry)  Result Value Ref Range   POC Glucose 167 (A) 70 - 99 mg/dl  POCT glycosylated hemoglobin (Hb A1C)  Result Value Ref Range   Hemoglobin A1C     HbA1c POC (<> result, manual entry)     HbA1c, POC (prediabetic range)     HbA1c, POC (controlled diabetic range) 7.2 (A) 0.0 - 7.0 %   A1C has improved from 7.9 in June Med Adherence:  [x]  Yes on Amaryl 2 mg daily and Metformin 1 gram BID Medication side effects:  []  Yes    [x]  No Home Monitoring?  [x]  Yes -checks BS once a day either before BF or bedtime Home glucose results range: gives range 107-120, Bedtime 140-160 Diet Adherence: [x]  Yes    []  No Exercise: [x]  Yes but not consistently    []  No Hypoglycemic episodes?: []  Yes    [x]  No Numbness of the feet? []  Yes    [x]  No Retinopathy hx? []  Yes    []  No Last eye exam: reports [place]  She does not recall the name of store Comments:   HTN:  compliant with Norvasc and Losartan.  Limits salt in foods.   No CP/SOB/LE edema  HL:  taking and tolerating Lipitor.  Last LDL was 58.  HM:  agrees to receiving flu shot.  Due for pap and colon CA screen.   Patient Active Problem List   Diagnosis Date Noted   Umbilical hernia without obstruction and without gangrene    Benign paroxysmal positional vertigo 06/04/2017   Diabetes mellitus type 2, controlled ([place]) 03/18/2016   Gastritis 09/16/2015   Dyspepsia 06/13/2015   Constipation 01/15/2014   GERD (gastroesophageal  reflux disease) 01/15/2014   Hypertension, essential 08/14/2013   Other and unspecified hyperlipidemia 08/14/2013   Preventative health care 07/14/2013     Current Outpatient Medications on File Prior to Visit  Medication Sig Dispense Refill   amLODipine (NORVASC) 5 MG tablet Take 1 tablet (5 mg total) by mouth daily. 90 tablet 1   Ascorbic Acid (VITAMIN C) 100 MG tablet Take 100 mg by mouth daily.     aspirin EC 81 MG tablet Take 1 tablet (81 mg total) by mouth daily. 30 tablet 11   atorvastatin (LIPITOR) 10 MG tablet TAKE 1 TABLET (10 MG TOTAL) BY MOUTH DAILY. 90 tablet 1   Blood Glucose Monitoring Suppl (TRUE METRIX METER) w/Device KIT Use as directed 1 kit 0   glimepiride (AMARYL) 2 MG tablet Take 1 tablet (2 mg total) by mouth daily before breakfast. 90 tablet 1   glucose blood (TRUE METRIX BLOOD GLUCOSE TEST) test strip Use as instructed 100 each 12   losartan (COZAAR) 100 MG tablet Take 1 tablet (167m) by mouth once daily. 90 tablet 1   metFORMIN (GLUCOPHAGE) 1000 MG tablet TAKE 1 TABLET (1,000 MG TOTAL) BY MOUTH 2 (TWO) TIMES DAILY WITH A MEAL. 180 tablet 1   Moringa Oleifera (MORINGA  PO) Take 1 tablet by mouth daily.     TRUEplus Lancets 28G MISC Use as directed 100 each 6   Vitamin D, Cholecalciferol, 400 units CAPS Take 400 Units by mouth every morning. 90 capsule 0   cetirizine (ZYRTEC) 10 MG tablet Take 1 tablet (10 mg total) by mouth daily as needed for itching (Patient not taking: Reported on 05/12/2022) 30 tablet 11   No current facility-administered medications on file prior to visit.    Allergies  Allergen Reactions   Linzess [Linaclotide] Diarrhea    DIARRHEA AT 145 MICROG    Social History   Socioeconomic History   Marital status: Single    Spouse name: Not on file   Number of children: Not on file   Years of education: Not on file   Highest education level: Not on file  Occupational History   Not on file  Tobacco Use   Smoking status: Never   Smokeless  tobacco: Never   Tobacco comments:    Never smoked  Vaping Use   Vaping Use: Never used  Substance and Sexual Activity   Alcohol use: No    Alcohol/week: 0.0 standard drinks of alcohol   Drug use: No   Sexual activity: Yes    Birth control/protection: Pill, None  Other Topics Concern   Not on file  Social History Narrative   ** Merged History Encounter **       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   Social Determinants of Health   Financial Resource Strain: Not on file  Food Insecurity: Not on file  Transportation Needs: Not on file  Physical Activity: Not on file  Stress: Not on file  Social Connections: Not on file  Intimate Partner Violence: Not on file    Family History  Problem Relation Age of Onset   Hypertension Mother    Arthritis Mother    Diabetes Maternal Grandmother    Colon cancer Neg Hx     Past Surgical History:  Procedure Laterality Date   ESOPHAGOGASTRODUODENOSCOPY N/A 06/24/2015   SLF: 1. Abdominal pain most likely due to constipation, GERD, and Gastritis. 2. MIld NON-erosive gastritis.   FOOT SURGERY Left 2000   Local   PLANTAR'S WART EXCISION     UMBILICAL HERNIA REPAIR N/A 04/17/2020   Procedure: HERNIA REPAIR UMBILICAL ADULT, Open WITH MESH;  Surgeon: Olean Ree, MD;  Location: [facility] ORS;  Service: General;  Laterality: N/A;    ROS: Review of Systems Negative except as stated above  PHYSICAL EXAM: BP 120/78 (BP Location: Left Arm, Patient Position: Sitting, Cuff Size: Normal)   Pulse 97   Temp 98.2 F (36.8 C) (Oral)   Ht 5' 3"  (1.6 m)   Wt 149 lb (67.6 kg)   SpO2 98%   BMI 26.39 kg/m   Wt Readings from Last 3 Encounters:  05/12/22 149 lb (67.6 kg)  04/30/22 147 lb 6.4 oz (66.9 kg)  02/05/22 150 lb 9.6 oz (68.3 kg)    Physical Exam  General appearance - alert, well appearing, and in no distress Mental status - normal mood, behavior, speech, dress, motor activity, and thought processes Neck - supple, no significant adenopathy Chest -  clear to auscultation, no wheezes, rales or rhonchi, symmetric air entry Heart - normal rate, regular rhythm, normal S1, S2, no murmurs, rubs, clicks or gallops Extremities - peripheral pulses normal, no pedal edema, no clubbing or cyanosis      Latest Ref Rng & Units 02/05/2022    4:26 PM  12/09/2020    8:50 AM 10/17/2020    4:30 PM  CMP  Glucose 70 - 99 mg/dL 220  109  115   BUN 6 - 24 mg/dL 10  8  11    Creatinine 0.57 - 1.00 mg/dL 0.65  0.53  0.52   Sodium 134 - 144 mmol/L 137  139  139   Potassium 3.5 - 5.2 mmol/L 4.1  4.3  4.5   Chloride 96 - 106 mmol/L 100  101  102   CO2 20 - 29 mmol/L 22  22  22    Calcium 8.7 - 10.2 mg/dL 9.7  9.4  9.8   Total Protein 6.0 - 8.5 g/dL 7.2  7.2  7.3   Total Bilirubin 0.0 - 1.2 mg/dL 0.3  0.6  0.3   Alkaline Phos 44 - 121 IU/L 82  59  70   AST 0 - 40 IU/L 19  15  14    ALT 0 - 32 IU/L 24  20  15     Lipid Panel     Component Value Date/Time   CHOL 133 02/05/2022 1626   TRIG 198 (H) 02/05/2022 1626   HDL 43 02/05/2022 1626   CHOLHDL 3.1 02/05/2022 1626   CHOLHDL 4.3 09/09/2015 0942   VLDL 23 09/09/2015 0942   LDLCALC 58 02/05/2022 1626    CBC    Component Value Date/Time   WBC 7.3 02/05/2022 1626   WBC 5.8 03/04/2017 1244   RBC 4.41 02/05/2022 1626   RBC 4.40 03/04/2017 1244   HGB 13.7 02/05/2022 1626   HCT 39.6 02/05/2022 1626   PLT 347 02/05/2022 1626   MCV 90 02/05/2022 1626   MCH 31.1 02/05/2022 1626   MCH 30.9 03/04/2017 1244   MCHC 34.6 02/05/2022 1626   MCHC 35.3 03/04/2017 1244   RDW 11.6 (L) 02/05/2022 1626   LYMPHSABS 2.6 02/05/2022 1626   MONOABS 0.2 03/04/2017 1244   EOSABS 0.2 02/05/2022 1626   BASOSABS 0.1 02/05/2022 1626    ASSESSMENT AND PLAN: 1. Type 2 diabetes mellitus without complication, without long-term current use of insulin ([place]) Close to goal and improved from last visit. She will continue current dose of metformin and Amaryl. Encouraged her to try to move more with goal of getting in about 150  minutes/week total of moderate intensity exercise. Encouraged her to continue healthy eating habits. - POCT glucose (manual entry) - POCT glycosylated hemoglobin (Hb A1C) - Microalbumin / creatinine urine ratio  2. Hypertension associated with type 2 diabetes mellitus ([place]) At goal.  Continue current dose of Norvasc and Cozaar.  3. Hyperlipidemia associated with type 2 diabetes mellitus ([place]) At goal.  Continue atorvastatin  4. Screening for colon cancer - Fecal occult blood, imunochemical([facility])  5. Need for immunization against influenza - Flu Vaccine QUAD 21moIM (Fluarix, Fluzone & Alfiuria Quad PF)    AMN Language interpreter used during this encounter. ##[id number] Bonds Patient was given the opportunity to ask questions.  Patient verbalized understanding of the plan and was able to repeat key elements of the plan.   This documentation was completed using DRadio producer  Any transcriptional errors are unintentional.  Orders Placed This Encounter  Procedures   Fecal occult blood, imunochemical([facility]/Sun[facility])   Flu Vaccine QUAD 617moM (Fluarix, Fluzone & Alfiuria Quad PF)   Microalbumin / creatinine urine ratio   POCT glucose (manual entry)   POCT glycosylated hemoglobin (Hb A1C)     Requested Prescriptions    No prescriptions requested or ordered  in this encounter    Return in about 6 weeks (around 06/23/2022) for PAP.  Karle Plumber, MD, FACP

## 2022-05-13 ENCOUNTER — Other Ambulatory Visit: Payer: Self-pay

## 2022-05-14 ENCOUNTER — Ambulatory Visit (HOSPITAL_BASED_OUTPATIENT_CLINIC_OR_DEPARTMENT_OTHER): Payer: Self-pay | Admitting: Student

## 2022-05-14 DIAGNOSIS — B079 Viral wart, unspecified: Secondary | ICD-10-CM

## 2022-05-14 LAB — MICROALBUMIN / CREATININE URINE RATIO
Creatinine, Urine: 68.7 mg/dL
Microalb/Creat Ratio: <4 mg/g creat (ref 0–29)
Microalbumin, Urine: <3 ug/mL

## 2022-05-14 NOTE — Patient Instructions (Signed)
It was great to see you! Thank you for allowing me to participate in your care!  We gave you cryotherapy for a wart on your thumb.   Our plans for today:  - A blister will form - Put Vaseline on once a day - Wait one month to see if lesion resolves, if not, make another appointment.  Take care and seek immediate care sooner if you develop any concerns.   Dr. Holley Bouche, MD [facility]   Cira Rue muy bien verte! Gracias por permitirme participar en su cuidado!  Le aplicamos crioterapia para una Wellsite geologist.  Nuestros planes para hoy: - Se formar una ampolla - Ponerse vaselina una vez al da. - Esperar un mes para ver si la lesin se resuelve, si no, pedir otra cita.  Tenga cuidado y busque atencin inmediata lo antes posible si tiene alguna inquietud.

## 2022-05-14 NOTE — Progress Notes (Signed)
  SUBJECTIVE:   CHIEF COMPLAINT / HPI:   Wart on thumb f/u that was treated with cryotherapy on 04/30/22. Notes that the wart looks the same and that it hurts. She appreciates more movement and less pain in thumb.   PERTINENT  PMH / PSH:    OBJECTIVE:  BP 115/73   Pulse 89   Wt 154 lb 6.4 oz (70 kg)   SpO2 99%   BMI 27.35 kg/m   General: NAD, pleasant, able to participate in exam Skin: warm and dry, 1 cm lesion with peeling and defined edges      ASSESSMENT/PLAN:  Wart on thumb Assessment & Plan: Diagnosis: Wart Procedure: Cryotherapy Location: Rt Thumb  After discussion of the risks, benefits, and alternative therapies available, the patient elected to proceed with 2nd round of cryotherapy. The patient's identity, procedure, and site were verified during a time out prior to proceeding procedure. The lesion on the thumb was treated using liquid nitrogen spray gun for 6 second per cycle, 3 cycles total. The patient tolerated the procedure well and there were no immediate complications.  Patient was provided aftercare handout and advised to return if lesion(s) did not fully resolved. -Patient to f/u in 1 month if lesion not resolved    Holley Bouche, MD 05/14/2022, 3:36 PM PGY-2, [place]

## 2022-05-14 NOTE — Assessment & Plan Note (Addendum)
Diagnosis: Wart Procedure: Cryotherapy Location: Rt Thumb  After discussion of the risks, benefits, and alternative therapies available, the patient elected to proceed with 2nd round of cryotherapy. The patient's identity, procedure, and site were verified during a time out prior to proceeding procedure. The lesion on the thumb was treated using liquid nitrogen spray gun for 6 second per cycle, 3 cycles total. The patient tolerated the procedure well and there were no immediate complications.  Patient was provided aftercare handout and advised to return if lesion(s) did not fully resolved. -Patient to f/u in 1 month if lesion not resolved

## 2022-05-15 ENCOUNTER — Other Ambulatory Visit: Payer: Self-pay

## 2022-05-15 LAB — FECAL OCCULT BLOOD, IMMUNOCHEMICAL: Fecal Occult Bld: NEGATIVE

## 2022-05-19 ENCOUNTER — Other Ambulatory Visit: Payer: Self-pay

## 2022-05-25 ENCOUNTER — Other Ambulatory Visit: Payer: Self-pay

## 2022-05-27 ENCOUNTER — Other Ambulatory Visit: Payer: Self-pay

## 2022-06-02 ENCOUNTER — Other Ambulatory Visit: Payer: Self-pay

## 2022-06-15 ENCOUNTER — Other Ambulatory Visit: Payer: Self-pay

## 2022-06-16 ENCOUNTER — Other Ambulatory Visit: Payer: Self-pay

## 2022-06-22 ENCOUNTER — Other Ambulatory Visit: Payer: Self-pay

## 2022-07-09 ENCOUNTER — Other Ambulatory Visit: Payer: Self-pay

## 2022-07-09 ENCOUNTER — Ambulatory Visit: Payer: Self-pay | Attending: Internal Medicine | Admitting: Internal Medicine

## 2022-07-09 ENCOUNTER — Other Ambulatory Visit (HOSPITAL_COMMUNITY)
Admission: RE | Admit: 2022-07-09 | Discharge: 2022-07-09 | Disposition: A | Discharge status: Home / Self Care | Payer: Self-pay | Source: Ambulatory Visit | Attending: Internal Medicine | Admitting: Internal Medicine

## 2022-07-09 VITALS — BP 132/84 | HR 86 | Temp 97.6°F | Wt 148.0 lb

## 2022-07-09 DIAGNOSIS — N926 Irregular menstruation, unspecified: Secondary | ICD-10-CM

## 2022-07-09 DIAGNOSIS — Z124 Encounter for screening for malignant neoplasm of cervix: Secondary | ICD-10-CM | POA: Insufficient documentation

## 2022-07-09 DIAGNOSIS — Z23 Encounter for immunization: Secondary | ICD-10-CM

## 2022-07-09 DIAGNOSIS — Z1231 Encounter for screening mammogram for malignant neoplasm of breast: Secondary | ICD-10-CM

## 2022-07-09 MED ORDER — ZOSTER VAC RECOMB ADJUVANTED 50 MCG/0.5ML IM SUSR
0.5000 mL | Freq: Once | INTRAMUSCULAR | 0 refills | Status: DC
  Filled 2022-07-09: qty 0.5, 1d supply, fill #0

## 2022-07-09 MED ORDER — ZOSTER VAC RECOMB ADJUVANTED 50 MCG/0.5ML IM SUSR
0.5000 mL | Freq: Once | INTRAMUSCULAR | 0 refills | Status: AC
  Filled 2022-07-09: qty 0.5, 1d supply, fill #0

## 2022-07-09 NOTE — Progress Notes (Signed)
Patient ID: Connie Russell, female    DOB: Dec 22, 1970  MRN: [id number]  CC: Diabetes (Dm f/u. /Interested in a mammogram & pap./Already received flu vax this season. )   Subjective: Connie Russell is a 51 y.o. female who presents for PAP.  Daughter Richrd Sox, is with her and pt insist that she interprets Her concerns today include:  Patient with history of diabetes type 2, HTN, HL, constipation, vitamin D deficiency.     GYN History:  Pt is G2P2 Any hx of abn paps?:once in [place] Menses regular or irregular?: irregular.  Had light spotting daily for 3 wks.  Bled for 2 days in September and August.   How long does menses last? Usually last 1 wk but over past several mths it last 2 days; but lasted 3 wks last mth.  No bleeding this mth.   Menstrual flow light or heavy?:  light Method of birth control?:  [facility] Any vaginal dischg at this time?:  yes, little itching at time Dysuria?: no Any hx of STI?:  Sexually active with how many partners:  one female Desires STI screen: yes Last MMG: 01/2020 Family hx of uterine, cervical or breast cancer?:     Patient Active Problem List   Diagnosis Date Noted   Wart on thumb 70/62/3762   Umbilical hernia without obstruction and without gangrene    Benign paroxysmal positional vertigo 06/04/2017   Diabetes mellitus type 2, controlled ([place]) 03/18/2016   Gastritis 09/16/2015   Dyspepsia 06/13/2015   Constipation 01/15/2014   GERD (gastroesophageal reflux disease) 01/15/2014   Hypertension, essential 08/14/2013   Other and unspecified hyperlipidemia 08/14/2013   Preventative health care 07/14/2013     Current Outpatient Medications on File Prior to Visit  Medication Sig Dispense Refill   amLODipine (NORVASC) 5 MG tablet Take 1 tablet (5 mg total) by mouth daily. 90 tablet 1   Ascorbic Acid (VITAMIN C) 100 MG tablet Take 100 mg by mouth daily.     aspirin EC 81 MG tablet Take 1 tablet (81 mg total)  by mouth daily. 30 tablet 11   atorvastatin (LIPITOR) 10 MG tablet TAKE 1 TABLET (10 MG TOTAL) BY MOUTH DAILY. 90 tablet 1   Blood Glucose Monitoring Suppl (TRUE METRIX METER) w/Device KIT Use as directed 1 kit 0   glimepiride (AMARYL) 2 MG tablet Take 1 tablet (2 mg total) by mouth daily before breakfast. 90 tablet 1   glucose blood (TRUE METRIX BLOOD GLUCOSE TEST) test strip Use as instructed 100 each 12   losartan (COZAAR) 100 MG tablet Take 1 tablet (161m) by mouth once daily. 90 tablet 1   metFORMIN (GLUCOPHAGE) 1000 MG tablet TAKE 1 TABLET (1,000 MG TOTAL) BY MOUTH 2 (TWO) TIMES DAILY WITH A MEAL. 180 tablet 1   Moringa Oleifera (MORINGA PO) Take 1 tablet by mouth daily.     TRUEplus Lancets 28G MISC Use as directed 100 each 6   Vitamin D, Cholecalciferol, 400 units CAPS Take 400 Units by mouth every morning. 90 capsule 0   No current facility-administered medications on file prior to visit.    Allergies  Allergen Reactions   Linzess [Linaclotide] Diarrhea    DIARRHEA AT 145 MICROG    Social History   Socioeconomic History   Marital status: Single    Spouse name: Not on file   Number of children: Not on file   Years of education: Not on file   Highest education level: Not  on file  Occupational History   Not on file  Tobacco Use   Smoking status: Never   Smokeless tobacco: Never   Tobacco comments:    Never smoked  Vaping Use   Vaping Use: Never used  Substance and Sexual Activity   Alcohol use: No    Alcohol/week: 0.0 standard drinks of alcohol   Drug use: No   Sexual activity: Yes    Birth control/protection: Pill, None  Other Topics Concern   Not on file  Social History Narrative   ** Merged History Encounter **       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   Social Determinants of Health   Financial Resource Strain: Not on file  Food Insecurity: Not on file  Transportation Needs: Not on file  Physical Activity: Not on file  Stress: Not on file  Social Connections:  Not on file  Intimate Partner Violence: Not on file    Family History  Problem Relation Age of Onset   Hypertension Mother    Arthritis Mother    Diabetes Maternal Grandmother    Colon cancer Neg Hx     Past Surgical History:  Procedure Laterality Date   ESOPHAGOGASTRODUODENOSCOPY N/A 06/24/2015   SLF: 1. Abdominal pain most likely due to constipation, GERD, and Gastritis. 2. MIld NON-erosive gastritis.   FOOT SURGERY Left 2000   Local   PLANTAR'S WART EXCISION     UMBILICAL HERNIA REPAIR N/A 04/17/2020   Procedure: HERNIA REPAIR UMBILICAL ADULT, Open WITH MESH;  Surgeon: Olean Ree, MD;  Location: [facility] ORS;  Service: General;  Laterality: N/A;    ROS: Review of Systems Negative except as stated above  PHYSICAL EXAM: BP 132/84 (BP Location: Left Arm, Patient Position: Sitting, Cuff Size: Normal)   Pulse 86   Temp 97.6 F (36.4 C) (Oral)   Wt 148 lb (67.1 kg)   SpO2 98%   BMI 26.22 kg/m   Physical Exam  General appearance - alert, well appearing, and in no distress Mental status - normal mood, behavior, speech, dress, motor activity, and thought processes Breasts - CMA Clarisa Alarcon present for breast and pelvic exam: breasts appear normal, no suspicious masses, no skin or nipple changes or axillary nodes Pelvic - normal external genitalia, vulva, vagina, uterus and adnexa.  Cervix appears slightly irregular in shape.  String from IUD noted outside cervical oz      Latest Ref Rng & Units 02/05/2022    4:26 PM 12/09/2020    8:50 AM 10/17/2020    4:30 PM  CMP  Glucose 70 - 99 mg/dL 220  109  115   BUN 6 - 24 mg/dL _0 Creatinine 0.57 - 1.00 mg/dL 0.65  0.53  0.52   Sodium 134 - 144 mmol/L 137  139  139   Potassium 3.5 - 5.2 mmol/L 4.1  4.3  4.5   Chloride 96 - 106 mmol/L 100  101  102   CO2 20 - 29 mmol/L _1 Calcium 8.7 - 10.2 mg/dL 9.7  9.4  9.8   Total Protein 6.0 - 8.5 g/dL 7.2  7.2  7.3   Total Bilirubin 0.0 - 1.2 mg/dL 0.3  0.6  0.3    Alkaline Phos 44 - 121 IU/L 82  59  70   AST 0 - 40 IU/L _2 ALT 0 - 32 IU/L _3 Lipid  Panel     Component Value Date/Time   CHOL 133 02/05/2022 1626   TRIG 198 (H) 02/05/2022 1626   HDL 43 02/05/2022 1626   CHOLHDL 3.1 02/05/2022 1626   CHOLHDL 4.3 09/09/2015 0942   VLDL 23 09/09/2015 0942   LDLCALC 58 02/05/2022 1626    CBC    Component Value Date/Time   WBC 7.3 02/05/2022 1626   WBC 5.8 03/04/2017 1244   RBC 4.41 02/05/2022 1626   RBC 4.40 03/04/2017 1244   HGB 13.7 02/05/2022 1626   HCT 39.6 02/05/2022 1626   PLT 347 02/05/2022 1626   MCV 90 02/05/2022 1626   MCH 31.1 02/05/2022 1626   MCH 30.9 03/04/2017 1244   MCHC 34.6 02/05/2022 1626   MCHC 35.3 03/04/2017 1244   RDW 11.6 (L) 02/05/2022 1626   LYMPHSABS 2.6 02/05/2022 1626   MONOABS 0.2 03/04/2017 1244   EOSABS 0.2 02/05/2022 1626   BASOSABS 0.1 02/05/2022 1626    ASSESSMENT AND PLAN:  1. Pap smear for cervical cancer screening - Cervicovaginal ancillary only - Cytology - PAP  2. Encounter for screening mammogram for malignant neoplasm of breast - MM Digital Screening; Future  3. Irregular menses Likely due to being perimenopausal.  Will check hormone levels and get pelvic US.  Told to apply for [facility] discount - FSH/LH - US Pelvic Complete With Transvaginal; Future  4. Need for shingles vaccine Given rxn to take to our pharmacy to get first shingles shot. - Zoster Vaccine Adjuvanted [facility]) injection; Inject 0.5 mLs into the muscle once for 1 dose.  Dispense: 0.5 mL; Refill: 0    Patient was given the opportunity to ask questions.  Patient verbalized understanding of the plan and was able to repeat key elements of the plan.   This documentation was completed using Radio producer.  Any transcriptional errors are unintentional.  No orders of the defined types were placed in this encounter.    Requested Prescriptions    No prescriptions requested  or ordered in this encounter    No follow-ups on file.  Karle Plumber, MD, FACP

## 2022-07-10 ENCOUNTER — Other Ambulatory Visit: Payer: Self-pay

## 2022-07-10 LAB — FSH/LH
FSH: 7.6 m[IU]/mL
LH: 11.8 m[IU]/mL

## 2022-07-13 LAB — CERVICOVAGINAL ANCILLARY ONLY
Bacterial Vaginitis (gardnerella): NEGATIVE
Candida Glabrata: NEGATIVE
Candida Vaginitis: NEGATIVE
Chlamydia: NEGATIVE
Comment: NEGATIVE
Comment: NEGATIVE
Comment: NEGATIVE
Comment: NEGATIVE
Comment: NEGATIVE
Comment: NORMAL
Neisseria Gonorrhea: NEGATIVE
Trichomonas: NEGATIVE

## 2022-07-15 LAB — CYTOLOGY - PAP
Comment: NEGATIVE
Diagnosis: NEGATIVE
High risk HPV: NEGATIVE

## 2022-07-17 ENCOUNTER — Ambulatory Visit (HOSPITAL_COMMUNITY)
Admission: RE | Admit: 2022-07-17 | Discharge: 2022-07-17 | Disposition: A | Discharge status: Home / Self Care | Payer: No Typology Code available for payment source | Source: Ambulatory Visit | Attending: Internal Medicine | Admitting: Internal Medicine

## 2022-07-17 DIAGNOSIS — N926 Irregular menstruation, unspecified: Secondary | ICD-10-CM

## 2022-07-18 ENCOUNTER — Other Ambulatory Visit: Payer: Self-pay | Admitting: Internal Medicine

## 2022-07-18 DIAGNOSIS — N926 Irregular menstruation, unspecified: Secondary | ICD-10-CM

## 2022-07-20 ENCOUNTER — Telehealth: Payer: Self-pay

## 2022-07-20 NOTE — Telephone Encounter (Signed)
Pt was called and is aware of results, DOB was confirmed.  ?

## 2022-07-20 NOTE — Telephone Encounter (Signed)
-----   Message from Marcine Matar, MD sent at 07/15/2022 10:56 AM EST ----- PAP is normal. Will be due again in 3-5 years.

## 2022-07-20 NOTE — Telephone Encounter (Signed)
Pt was called and is aware of results, DOB was confirmed.  Interpreter id #[id number] 

## 2022-07-20 NOTE — Telephone Encounter (Signed)
-----   Message from Marcine Matar, MD sent at 07/18/2022  9:13 AM EST ----- Let patient know that a pelvic ultrasound was normal.  I have referred her to a gynecologist for the irregular bleeding.

## 2022-07-22 ENCOUNTER — Other Ambulatory Visit: Payer: Self-pay

## 2022-07-23 ENCOUNTER — Other Ambulatory Visit: Payer: Self-pay

## 2022-07-28 ENCOUNTER — Other Ambulatory Visit: Payer: Self-pay | Admitting: Physician Assistant

## 2022-07-28 ENCOUNTER — Other Ambulatory Visit: Payer: Self-pay

## 2022-07-28 DIAGNOSIS — E119 Type 2 diabetes mellitus without complications: Secondary | ICD-10-CM

## 2022-07-28 MED ORDER — GLIMEPIRIDE 2 MG PO TABS
2.0000 mg | ORAL_TABLET | Freq: Every day | ORAL | 0 refills | Status: DC
  Filled 2022-07-28: qty 90, 90d supply, fill #0
  Filled 2022-08-13: qty 30, 30d supply, fill #0
  Filled 2022-09-13: qty 30, 30d supply, fill #1
  Filled 2022-10-13: qty 30, 30d supply, fill #2

## 2022-08-14 ENCOUNTER — Other Ambulatory Visit: Payer: Self-pay

## 2022-08-17 ENCOUNTER — Other Ambulatory Visit: Payer: Self-pay

## 2022-08-20 ENCOUNTER — Other Ambulatory Visit: Payer: Self-pay | Admitting: Physician Assistant

## 2022-08-20 DIAGNOSIS — E119 Type 2 diabetes mellitus without complications: Secondary | ICD-10-CM

## 2022-08-20 DIAGNOSIS — E1159 Type 2 diabetes mellitus with other circulatory complications: Secondary | ICD-10-CM

## 2022-08-21 ENCOUNTER — Other Ambulatory Visit: Payer: Self-pay

## 2022-08-21 MED ORDER — LOSARTAN POTASSIUM 100 MG PO TABS
100.0000 mg | ORAL_TABLET | Freq: Every day | ORAL | 0 refills | Status: DC
  Filled 2022-08-21: qty 90, 90d supply, fill #0

## 2022-08-21 NOTE — Telephone Encounter (Signed)
Requested Prescriptions  Pending Prescriptions Disp Refills   losartan (COZAAR) 100 MG tablet 90 tablet 0    Sig: Take 1 tablet (100mg ) by mouth once daily.     Cardiovascular:  Angiotensin Receptor Blockers Failed - 08/20/2022 10:26 PM      Failed - Cr in normal range and within 180 days    Creat  Date Value Ref Range Status  03/18/2016 0.61 0.50 - 1.10 mg/dL Final   Creatinine, Ser  Date Value Ref Range Status  02/05/2022 0.65 0.57 - 1.00 mg/dL Final         Failed - K in normal range and within 180 days    Potassium  Date Value Ref Range Status  02/05/2022 4.1 3.5 - 5.2 mmol/L Final         Passed - Patient is not pregnant      Passed - Last BP in normal range    BP Readings from Last 1 Encounters:  07/09/22 132/84         Passed - Valid encounter within last 6 months    Recent Outpatient Visits           1 month ago Pap smear for cervical cancer screening   [place], Neoma Laming B, MD   3 months ago Type 2 diabetes mellitus without complication, without long-term current use of insulin ([place])   [place], Neoma Laming B, MD   6 months ago Type 2 diabetes mellitus without complication, without long-term current use of insulin [facility])   [place], [place], [place]   10 months ago Acute abdominal pain   [place], Deborah B, MD   1 year ago Type 2 diabetes mellitus without complication, without long-term current use of insulin [facility])   [place], Deborah B, MD       Future Appointments             In 2 months Wynetta Emery Dalbert Batman, MD [place]

## 2022-08-24 ENCOUNTER — Other Ambulatory Visit: Payer: Self-pay

## 2022-08-27 ENCOUNTER — Other Ambulatory Visit: Payer: Self-pay

## 2022-09-04 ENCOUNTER — Ambulatory Visit
Admission: RE | Admit: 2022-09-04 | Discharge: 2022-09-04 | Disposition: A | Discharge status: Home / Self Care | Payer: Self-pay | Source: Ambulatory Visit | Attending: Internal Medicine | Admitting: Internal Medicine

## 2022-09-04 ENCOUNTER — Other Ambulatory Visit: Payer: Self-pay | Admitting: Internal Medicine

## 2022-09-04 ENCOUNTER — Other Ambulatory Visit: Payer: Self-pay

## 2022-09-04 DIAGNOSIS — E119 Type 2 diabetes mellitus without complications: Secondary | ICD-10-CM

## 2022-09-04 DIAGNOSIS — N644 Mastodynia: Secondary | ICD-10-CM

## 2022-09-04 DIAGNOSIS — Z1231 Encounter for screening mammogram for malignant neoplasm of breast: Secondary | ICD-10-CM

## 2022-09-04 DIAGNOSIS — I152 Hypertension secondary to endocrine disorders: Secondary | ICD-10-CM

## 2022-09-04 MED ORDER — METFORMIN HCL 1000 MG PO TABS
1000.0000 mg | ORAL_TABLET | Freq: Two times a day (BID) | ORAL | 1 refills | Status: DC
  Filled 2022-09-04: qty 180, 90d supply, fill #0

## 2022-09-04 NOTE — Telephone Encounter (Signed)
Requested Prescriptions  Pending Prescriptions Disp Refills   metFORMIN (GLUCOPHAGE) 1000 MG tablet 180 tablet 1    Sig: TAKE 1 TABLET (1,000 MG TOTAL) BY MOUTH 2 (TWO) TIMES DAILY WITH A MEAL.     Endocrinology:  Diabetes - Biguanides Failed - 09/04/2022  4:08 PM      Failed - B12 Level in normal range and within 720 days    Vitamin B-12  Date Value Ref Range Status  01/15/2014 665 211 - 911 pg/mL Final         Passed - Cr in normal range and within 360 days    Creat  Date Value Ref Range Status  03/18/2016 0.61 0.50 - 1.10 mg/dL Final   Creatinine, Ser  Date Value Ref Range Status  02/05/2022 0.65 0.57 - 1.00 mg/dL Final         Passed - HBA1C is between 0 and 7.9 and within 180 days    HbA1c, POC (controlled diabetic range)  Date Value Ref Range Status  05/12/2022 7.2 (A) 0.0 - 7.0 % Final         Passed - eGFR in normal range and within 360 days    GFR, Est African American  Date Value Ref Range Status  03/18/2016 >89 >=60 mL/min Final   GFR calc Af Amer  Date Value Ref Range Status  04/12/2020 128 >59 mL/min/1.73 Final    Comment:    **[facility] currently reports eGFR in compliance with the current**   recommendations of the [employer]. [facility] will   update reporting as new guidelines are published from the NKF-ASN   Task force.    GFR, Est Non African American  Date Value Ref Range Status  03/18/2016 >89 >=60 mL/min Final   GFR calc non Af Amer  Date Value Ref Range Status  04/12/2020 111 >59 mL/min/1.73 Final   eGFR  Date Value Ref Range Status  02/05/2022 108 >59 mL/min/1.73 Final         Passed - Valid encounter within last 6 months    Recent Outpatient Visits           1 month ago Pap smear for cervical cancer screening   [place] Karle Plumber B, MD   3 months ago Type 2 diabetes mellitus without complication, without long-term current use of insulin ([place])   [place] Karle Plumber B, MD   7 months ago Type 2 diabetes mellitus without complication, without long-term current use of insulin [facility])   [place], Levada Dy M, [place]   11 months ago Acute abdominal pain   [place] Karle Plumber B, MD   1 year ago Type 2 diabetes mellitus without complication, without long-term current use of insulin [facility])   [place], MD       Future Appointments             In 2 months Ladell Pier, MD [place] within normal limits and completed in the last 12 months    WBC  Date Value Ref Range Status  02/05/2022 7.3 3.4 - 10.8 x10E3/uL Final  03/04/2017 5.8 4.0 - 10.5 K/uL Final   RBC  Date Value Ref Range Status  02/05/2022 4.41 3.77 -  5.28 x10E6/uL Final  03/04/2017 4.40 3.87 - 5.11 MIL/uL Final   Hemoglobin  Date Value Ref Range Status  02/05/2022 13.7 11.1 - 15.9 g/dL Final   Hematocrit  Date Value Ref Range Status  02/05/2022 39.6 34.0 - 46.6 % Final   MCHC  Date Value Ref Range Status  02/05/2022 34.6 31.5 - 35.7 g/dL Final  03/04/2017 35.3 30.0 - 36.0 g/dL Final   [facility]  Date Value Ref Range Status  02/05/2022 31.1 26.6 - 33.0 pg Final  03/04/2017 30.9 26.0 - 34.0 pg Final   MCV  Date Value Ref Range Status  02/05/2022 90 79 - 97 fL Final   No results found for: "PLTCOUNTKUC", "LABPLAT", "POCPLA" RDW  Date Value Ref Range Status  02/05/2022 11.6 (L) 11.7 - 15.4 % Final

## 2022-09-07 ENCOUNTER — Other Ambulatory Visit: Payer: Self-pay

## 2022-09-14 ENCOUNTER — Other Ambulatory Visit: Payer: Self-pay

## 2022-09-17 ENCOUNTER — Ambulatory Visit: Payer: Self-pay

## 2022-09-17 ENCOUNTER — Ambulatory Visit
Admission: RE | Admit: 2022-09-17 | Discharge: 2022-09-17 | Disposition: A | Discharge status: Home / Self Care | Payer: PRIVATE HEALTH INSURANCE | Source: Ambulatory Visit | Attending: Internal Medicine | Admitting: Internal Medicine

## 2022-09-17 DIAGNOSIS — N644 Mastodynia: Secondary | ICD-10-CM

## 2022-09-23 ENCOUNTER — Other Ambulatory Visit: Payer: Self-pay | Admitting: Physician Assistant

## 2022-09-23 DIAGNOSIS — E1169 Type 2 diabetes mellitus with other specified complication: Secondary | ICD-10-CM

## 2022-09-24 ENCOUNTER — Other Ambulatory Visit: Payer: Self-pay

## 2022-09-24 MED ORDER — ATORVASTATIN CALCIUM 10 MG PO TABS
10.0000 mg | ORAL_TABLET | Freq: Every day | ORAL | 0 refills | Status: DC
  Filled 2022-09-24: qty 90, 90d supply, fill #0

## 2022-10-14 ENCOUNTER — Other Ambulatory Visit: Payer: Self-pay

## 2022-10-16 ENCOUNTER — Other Ambulatory Visit: Payer: Self-pay

## 2022-10-28 ENCOUNTER — Other Ambulatory Visit: Payer: Self-pay | Admitting: Physician Assistant

## 2022-10-28 DIAGNOSIS — I152 Hypertension secondary to endocrine disorders: Secondary | ICD-10-CM

## 2022-10-29 ENCOUNTER — Other Ambulatory Visit: Payer: Self-pay

## 2022-10-29 MED ORDER — AMLODIPINE BESYLATE 5 MG PO TABS
5.0000 mg | ORAL_TABLET | Freq: Every day | ORAL | 0 refills | Status: DC
  Filled 2022-10-29: qty 90, 90d supply, fill #0

## 2022-11-06 ENCOUNTER — Ambulatory Visit: Payer: PRIVATE HEALTH INSURANCE | Admitting: Internal Medicine

## 2022-11-08 ENCOUNTER — Other Ambulatory Visit: Payer: Self-pay | Admitting: Internal Medicine

## 2022-11-08 DIAGNOSIS — E119 Type 2 diabetes mellitus without complications: Secondary | ICD-10-CM

## 2022-11-09 ENCOUNTER — Other Ambulatory Visit: Payer: Self-pay

## 2022-11-09 MED ORDER — GLIMEPIRIDE 2 MG PO TABS
2.0000 mg | ORAL_TABLET | Freq: Every day | ORAL | 0 refills | Status: DC
  Filled 2022-11-09: qty 90, 90d supply, fill #0

## 2022-11-09 NOTE — Telephone Encounter (Signed)
Requested Prescriptions  Pending Prescriptions Disp Refills   glimepiride (AMARYL) 2 MG tablet 90 tablet 0    Sig: Take 1 tablet (2 mg total) by mouth daily before breakfast.     Endocrinology:  Diabetes - Sulfonylureas Passed - 11/08/2022  9:44 PM      Passed - HBA1C is between 0 and 7.9 and within 180 days    HbA1c, POC (controlled diabetic range)  Date Value Ref Range Status  05/12/2022 7.2 (A) 0.0 - 7.0 % Final         Passed - Cr in normal range and within 360 days    Creat  Date Value Ref Range Status  03/18/2016 0.61 0.50 - 1.10 mg/dL Final   Creatinine, Ser  Date Value Ref Range Status  02/05/2022 0.65 0.57 - 1.00 mg/dL Final         Passed - Valid encounter within last 6 months    Recent Outpatient Visits           4 months ago Pap smear for cervical cancer screening   [place] Karle Plumber B, MD   6 months ago Type 2 diabetes mellitus without complication, without long-term current use of insulin [facility])   [place] Karle Plumber B, MD   9 months ago Type 2 diabetes mellitus without complication, without long-term current use of insulin [facility])   [place], [place], [place]   1 year ago Acute abdominal pain   [place] Karle Plumber B, MD   1 year ago Type 2 diabetes mellitus without complication, without long-term current use of insulin [facility])   [place], MD       Future Appointments             [place] Ladell Pier, MD [place]

## 2022-11-10 ENCOUNTER — Other Ambulatory Visit: Payer: Self-pay

## 2022-11-10 ENCOUNTER — Ambulatory Visit: Payer: PRIVATE HEALTH INSURANCE | Attending: Internal Medicine | Admitting: Internal Medicine

## 2022-11-10 ENCOUNTER — Encounter: Payer: Self-pay | Admitting: Internal Medicine

## 2022-11-10 VITALS — BP 108/72 | HR 88 | Temp 97.8°F | Ht 63.0 in | Wt 150.0 lb

## 2022-11-10 DIAGNOSIS — R079 Chest pain, unspecified: Secondary | ICD-10-CM | POA: Diagnosis not present

## 2022-11-10 DIAGNOSIS — E1169 Type 2 diabetes mellitus with other specified complication: Secondary | ICD-10-CM | POA: Insufficient documentation

## 2022-11-10 DIAGNOSIS — E785 Hyperlipidemia, unspecified: Secondary | ICD-10-CM | POA: Diagnosis not present

## 2022-11-10 DIAGNOSIS — E1159 Type 2 diabetes mellitus with other circulatory complications: Secondary | ICD-10-CM | POA: Diagnosis not present

## 2022-11-10 DIAGNOSIS — E119 Type 2 diabetes mellitus without complications: Secondary | ICD-10-CM

## 2022-11-10 DIAGNOSIS — K219 Gastro-esophageal reflux disease without esophagitis: Secondary | ICD-10-CM

## 2022-11-10 DIAGNOSIS — Z23 Encounter for immunization: Secondary | ICD-10-CM | POA: Diagnosis not present

## 2022-11-10 DIAGNOSIS — I152 Hypertension secondary to endocrine disorders: Secondary | ICD-10-CM

## 2022-11-10 LAB — POCT GLYCOSYLATED HEMOGLOBIN (HGB A1C): HbA1c, POC (controlled diabetic range): 7.8 % — AB (ref 0.0–7.0)

## 2022-11-10 LAB — GLUCOSE, POCT (MANUAL RESULT ENTRY): POC Glucose: 206 mg/dl — AB (ref 70–99)

## 2022-11-10 MED ORDER — GLIMEPIRIDE 2 MG PO TABS
3.0000 mg | ORAL_TABLET | Freq: Every day | ORAL | 1 refills | Status: DC
  Filled 2022-11-10 (×2): qty 135, 90d supply, fill #0
  Filled 2023-02-17: qty 135, 90d supply, fill #1

## 2022-11-10 MED ORDER — METFORMIN HCL 1000 MG PO TABS
1000.0000 mg | ORAL_TABLET | Freq: Two times a day (BID) | ORAL | 1 refills | Status: DC
  Filled 2022-11-10 – 2022-12-08 (×2): qty 180, 90d supply, fill #0
  Filled 2023-01-20 – 2023-03-12 (×2): qty 180, 90d supply, fill #1

## 2022-11-10 MED ORDER — ATORVASTATIN CALCIUM 10 MG PO TABS
10.0000 mg | ORAL_TABLET | Freq: Every day | ORAL | 1 refills | Status: DC
  Filled 2022-11-10 – 2022-12-08 (×2): qty 90, 90d supply, fill #0
  Filled 2023-03-12: qty 90, 90d supply, fill #1

## 2022-11-10 MED ORDER — LOSARTAN POTASSIUM 100 MG PO TABS
100.0000 mg | ORAL_TABLET | Freq: Every day | ORAL | 1 refills | Status: DC
  Filled 2022-11-10: qty 90, 90d supply, fill #0
  Filled 2023-02-23: qty 90, 90d supply, fill #1

## 2022-11-10 MED ORDER — AMLODIPINE BESYLATE 5 MG PO TABS
5.0000 mg | ORAL_TABLET | Freq: Every day | ORAL | 1 refills | Status: DC
  Filled 2022-11-10 – 2023-01-20 (×2): qty 90, 90d supply, fill #0
  Filled 2023-04-27: qty 90, 90d supply, fill #1

## 2022-11-10 MED ORDER — OMEPRAZOLE 20 MG PO CPDR
20.0000 mg | DELAYED_RELEASE_CAPSULE | Freq: Every day | ORAL | 0 refills | Status: DC
  Filled 2022-11-10: qty 30, 30d supply, fill #0
  Filled 2022-12-08: qty 30, 30d supply, fill #1
  Filled 2023-01-20 – 2023-01-28 (×2): qty 30, 30d supply, fill #2

## 2022-11-10 NOTE — Progress Notes (Signed)
Patient ID: Connie Russell, female    DOB: [birth year]  MRN: [id number]  CC: Diabetes (DM f/u. /Intermittent SOB, chest discomfort (feels that something stuck in chest) Xunsure of how long/Already received flu vax. )   Subjective: Connie Russell is a 52 y.o. female who presents for chronic disease management. Her concerns today include:  Patient with history of diabetes type 2, HTN, HL, constipation, vitamin D deficiency.    [employer] Language interpreter used during this encounter. #Allison [id number]   DM:  Results for orders placed or performed in visit on 11/10/22  POCT glucose (manual entry)  Result Value Ref Range   POC Glucose 206 (A) 70 - 99 mg/dl  POCT glycosylated hemoglobin (Hb A1C)  Result Value Ref Range   Hemoglobin A1C     HbA1c POC (<> result, manual entry)     HbA1c, POC (prediabetic range)     HbA1c, POC (controlled diabetic range) 7.8 (A) 0.0 - 7.0 %  A1C 7.8 A1C increased from last vsit when it was 7.2.   Reports compliance with taking metformin 1 g twice a day and Amaryl 2 mg daily. Out of Amaryl for about 3-4 days. Checks BS occasionally No sodas or juice; usually has fruits for BF Works then gets home and has to take care of mom so not getting out to exercise much  HTN: Reports compliance with Norvasc 5 mg daily and Cozaar 100 mg daily. Reports intermittent SOB and CP x 1 mth, pressure like that can last few minutes to hr. CP located in center of chest b/w breasts, very uncomfortable.  Can occur with exertion or rest.  No radiation down arms.  No problems swallowing but sometimes feel like something stuck in chest especially in mornings after taking pills.  Endorses heartburn at times but this is different.    HL: Taking and tolerating atorvastatin 10 mg daily.  HM:  due for 2nd shingrix vaccine and Tdapt Patient Active Problem List   Diagnosis Date Noted   Hyperlipidemia associated with type 2 diabetes mellitus 11/10/2022   Wart on thumb  [id number]   Umbilical hernia without obstruction and without gangrene    Benign paroxysmal positional vertigo 06/04/2017   Diabetes mellitus type 2, controlled 03/18/2016   Gastritis 09/16/2015   Dyspepsia 06/13/2015   Constipation 01/15/2014   GERD (gastroesophageal reflux disease) 01/15/2014   Hypertension, essential 08/14/2013   Other and unspecified hyperlipidemia 08/14/2013   Preventative health care 07/14/2013     Current Outpatient Medications on File Prior to Visit  Medication Sig Dispense Refill   Ascorbic Acid (VITAMIN C) 100 MG tablet Take 100 mg by mouth daily.     aspirin EC 81 MG tablet Take 1 tablet (81 mg total) by mouth daily. 30 tablet 11   Blood Glucose Monitoring Suppl (TRUE METRIX METER) w/Device KIT Use as directed 1 kit 0   glucose blood (TRUE METRIX BLOOD GLUCOSE TEST) test strip Use as instructed 100 each 12   Moringa Oleifera (MORINGA PO) Take 1 tablet by mouth daily.     TRUEplus Lancets 28G MISC Use as directed 100 each 6   Vitamin D, Cholecalciferol, 400 units CAPS Take 400 Units by mouth every morning. 90 capsule 0   No current facility-administered medications on file prior to visit.    Allergies  Allergen Reactions   Linzess [Linaclotide] Diarrhea    DIARRHEA AT 145 MICROG    Social History   Socioeconomic History   Marital status: Single  Spouse name: Not on file   Number of children: Not on file   Years of education: Not on file   Highest education level: Not on file  Occupational History   Not on file  Tobacco Use   Smoking status: Never   Smokeless tobacco: Never   Tobacco comments:    Never smoked  Vaping Use   Vaping Use: Never used  Substance and Sexual Activity   Alcohol use: No    Alcohol/week: 0.0 standard drinks of alcohol   Drug use: No   Sexual activity: Yes    Birth control/protection: Pill, None  Other Topics Concern   Not on file  Social History Narrative   ** Merged History Encounter **       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   Social Determinants of Health   Financial Resource Strain: Not on file  Food Insecurity: Not on file  Transportation Needs: Not on file  Physical Activity: Not on file  Stress: Not on file  Social Connections: Not on file  Intimate Partner Violence: Not on file    Family History  Problem Relation Age of Onset   Hypertension Mother    Arthritis Mother    Diabetes Maternal Grandmother    Colon cancer Neg Hx     Past Surgical History:  Procedure Laterality Date   ESOPHAGOGASTRODUODENOSCOPY N/A 06/24/2015   SLF: 1. Abdominal pain most likely due to constipation, GERD, and Gastritis. 2. MIld NON-erosive gastritis.   FOOT SURGERY Left 2000   Local   PLANTAR'S WART EXCISION     UMBILICAL HERNIA REPAIR N/A 04/17/2020   Procedure: HERNIA REPAIR UMBILICAL ADULT, Open WITH MESH;  Surgeon: Olean Ree, MD;  Location: [facility];  Service: General;  Laterality: N/A;    ROS: Review of Systems Negative except as stated above  PHYSICAL EXAM: BP 108/72 (BP Location: Left Arm, Patient Position: Sitting, Cuff Size: Normal)   Pulse 88   Temp 97.8 F (36.6 C) (Oral)   Ht 5\' 3"  (1.6 m)   Wt 150 lb (68 kg)   SpO2 98%   BMI 26.57 kg/m   Wt Readings from Last 3 Encounters:  11/10/22 150 lb (68 kg)  07/09/22 148 lb (67.1 kg)  05/14/22 154 lb 6.4 oz (70 kg)    Physical Exam  General appearance - alert, well appearing, and in no distress Mental status - normal mood, behavior, speech, dress, motor activity, and thought processes Neck - supple, no significant adenopathy Chest - clear to auscultation, no wheezes, rales or rhonchi, symmetric air entry Heart - normal rate, regular rhythm, normal S1, S2, no murmurs, rubs, clicks or gallops Extremities - peripheral pulses normal, no pedal edema, no clubbing or cyanosis Diabetic Foot Exam - Simple   Simple Foot Form Diabetic Foot exam was performed with the following findings: Yes 11/10/2022  2:55 PM  Visual Inspection No  deformities, no ulcerations, no other skin breakdown bilaterally: Yes Sensation Testing Intact to touch and monofilament testing bilaterally: Yes Pulse Check Posterior Tibialis and Dorsalis pulse intact bilaterally: Yes Comments         Latest Ref Rng & Units 02/05/2022    4:26 PM 12/09/2020    8:50 AM 10/17/2020    4:30 PM  CMP  Glucose 70 - 99 mg/dL 220  109  115   BUN 6 - 24 mg/dL 10  8  11    Creatinine 0.57 - 1.00 mg/dL 0.65  0.53  0.52   Sodium 134 - 144 mmol/L  137  139  139   Potassium 3.5 - 5.2 mmol/L 4.1  4.3  4.5   Chloride 96 - 106 mmol/L 100  101  102   CO2 20 - 29 mmol/L 22  22  22    Calcium 8.7 - 10.2 mg/dL 9.7  9.4  9.8   Total Protein 6.0 - 8.5 g/dL 7.2  7.2  7.3   Total Bilirubin 0.0 - 1.2 mg/dL 0.3  0.6  0.3   Alkaline Phos 44 - 121 IU/L 82  59  70   AST 0 - 40 IU/L 19  15  14    ALT 0 - 32 IU/L 24  20  15     Lipid Panel     Component Value Date/Time   CHOL 133 02/05/2022 1626   TRIG 198 (H) 02/05/2022 1626   HDL 43 02/05/2022 1626   CHOLHDL 3.1 02/05/2022 1626   CHOLHDL 4.3 09/09/2015 0942   VLDL 23 09/09/2015 0942   LDLCALC 58 02/05/2022 1626    CBC    Component Value Date/Time   WBC 7.3 02/05/2022 1626   WBC 5.8 03/04/2017 1244   RBC 4.41 02/05/2022 1626   RBC 4.40 03/04/2017 1244   HGB 13.7 02/05/2022 1626   HCT 39.6 02/05/2022 1626   PLT 347 02/05/2022 1626   MCV 90 02/05/2022 1626   MCH 31.1 02/05/2022 1626   MCH 30.9 03/04/2017 1244   MCHC 34.6 02/05/2022 1626   MCHC 35.3 03/04/2017 1244   RDW 11.6 (L) 02/05/2022 1626   LYMPHSABS 2.6 02/05/2022 1626   MONOABS 0.2 03/04/2017 1244   EOSABS 0.2 02/05/2022 1626   BASOSABS 0.1 02/05/2022 1626    ASSESSMENT AND PLAN: 1. Type 2 diabetes mellitus without complication, without long-term current use of insulin Not at goal. Encourage healthy eating habits. Went over ways she can exercise indoors like marching in place for 30 minutes several days a week. Continue metformin. Increase Amaryl  to 3 mg daily. - POCT glucose (manual entry) - POCT glycosylated hemoglobin (Hb A1C) - losartan (COZAAR) 100 MG tablet; Take 1 tablet (100mg ) by mouth once daily.  Dispense: 90 tablet; Refill: 1 - metFORMIN (GLUCOPHAGE) 1000 MG tablet; Take 1 tablet (1,000 mg total) by mouth 2 (two) times daily with a meal.  Dispense: 180 tablet; Refill: 1 - glimepiride (AMARYL) 2 MG tablet; Take 1.5 tablets (3 mg total) by mouth daily before breakfast.  Dispense: 135 tablet; Refill: 1  2. Hyperlipidemia associated with type 2 diabetes mellitus Continue atorvastatin.  Last LDL was 58 which is at goal. - atorvastatin (LIPITOR) 10 MG tablet; Take 1 tablet (10 mg total) by mouth daily.  Dispense: 90 tablet; Refill: 1  3. Hypertension associated with type 2 diabetes mellitus At goal.  Continue amlodipine and Cozaar. - amLODipine (NORVASC) 5 MG tablet; Take 1 tablet (5 mg total) by mouth daily.  Dispense: 90 tablet; Refill: 1 - losartan (COZAAR) 100 MG tablet; Take 1 tablet (100mg ) by mouth once daily.  Dispense: 90 tablet; Refill: 1 - metFORMIN (GLUCOPHAGE) 1000 MG tablet; Take 1 tablet (1,000 mg total) by mouth 2 (two) times daily with a meal.  Dispense: 180 tablet; Refill: 1  4. Chest pain in adult Sounds suspicious for possible angina.  However she also has some component of acid reflux.  Will refer to cardiology. - Ambulatory referral to Cardiology  5. Gastroesophageal reflux disease without esophagitis GERD precautions discussed.  Advised to avoid certain foods like spicy foods, tomato-based foods, juices and excessive caffeine.  Advised to eat his last meal at least 2 to 3 hours before laying down at nights and to sleep with his head slightly elevated.    - omeprazole (PRILOSEC) 20 MG capsule; Take 1 capsule (20 mg total) by mouth daily.  Dispense: 90 capsule; Refill: 0  6. Need for shingles vaccine Given second Shingrix vaccine today.    Patient was given the opportunity to ask questions.  Patient  verbalized understanding of the plan and was able to repeat key elements of the plan.   This documentation was completed using Radio producer.  Any transcriptional errors are unintentional.  Orders Placed This Encounter  Procedures   Varicella-zoster vaccine IM   Ambulatory referral to Cardiology   POCT glucose (manual entry)   POCT glycosylated hemoglobin (Hb A1C)     Requested Prescriptions   Signed Prescriptions Disp Refills   amLODipine (NORVASC) 5 MG tablet 90 tablet 1    Sig: Take 1 tablet (5 mg total) by mouth daily.   atorvastatin (LIPITOR) 10 MG tablet 90 tablet 1    Sig: Take 1 tablet (10 mg total) by mouth daily.   losartan (COZAAR) 100 MG tablet 90 tablet 1    Sig: Take 1 tablet (100mg ) by mouth once daily.   metFORMIN (GLUCOPHAGE) 1000 MG tablet 180 tablet 1    Sig: Take 1 tablet (1,000 mg total) by mouth 2 (two) times daily with a meal.   omeprazole (PRILOSEC) 20 MG capsule 90 capsule 0    Sig: Take 1 capsule (20 mg total) by mouth daily.   glimepiride (AMARYL) 2 MG tablet 135 tablet 1    Sig: Take 1.5 tablets (3 mg total) by mouth daily before breakfast.    Return in about 4 months (around 03/12/2023).  Karle Plumber, MD, FACP

## 2022-11-10 NOTE — Patient Instructions (Signed)
Enfermedad de reflujo gastroesof?gico en los adultos ?Gastroesophageal Reflux Disease, Adult ? ?El reflujo gastroesof?gico (RGE) ocurre cuando el ?cido del est?mago sube por el tubo que conecta la boca con el est?mago (es?fago). Normalmente, la comida baja por el es?fago y se mantiene en el est?mago, donde se la digiere. Cuando una persona tiene RGE, los alimentos y el ?cido estomacal suelen volver al es?fago. ?Usted puede tener una enfermedad llamada enfermedad de reflujo gastroesof?gico (ERGE) si el reflujo: ?Sucede a menudo. ?Causa s?ntomas frecuentes o muy intensos. ?Causa problemas tales como da?o en el es?fago. ?Cuando esto ocurre, el es?fago duele y se hincha. Con el tiempo, la ERGE puede ocasionar peque?os agujeros (?lceras) en el revestimiento del es?fago. ??Cu?les son las causas? ?Esta afecci?n se debe a un problema en el m?sculo que se encuentra entre el es?fago y el est?mago. Cuando este m?sculo est? d?bil o no es normal, no se cierra correctamente para impedir que los alimentos y el ?cido regresen del est?mago. ?El m?sculo puede debilitarse debido a lo siguiente: ?El consumo de tabaco. ?Embarazo. ?Tener cierto tipo de hernia (hernia de hiato). ?Consumo de alcohol. ?Ciertos alimentos y bebidas, como caf?, chocolate, cebollas y menta. ??Qu? incrementa el riesgo? ?Tener sobrepeso. ?Tener una enfermedad que afecta el tejido conjuntivo. ?Tomar antiinflamatorios no esteroideos (AINE), como el ibuprofeno. ??Cu?les son los signos o s?ntomas? ?Acidez estomacal. ?Dificultad o dolor al tragar. ?Sensaci?n de tener un bulto en la garganta. ?Sabor amargo en la boca. ?Mal aliento. ?Tener una gran cantidad de saliva. ?Est?mago inflamado o con malestar. ?Eructos. ?Dolor en el pecho. El dolor de pecho puede deberse a distintas afecciones. Aseg?rese de consultar a su m?dico si tiene dolor en el pecho. ?Dificultad para respirar o sibilancias. ?Una tos a largo plazo o tos nocturna. ?Desgaste de la superficie de los dientes  (esmalte dental). ?P?rdida de peso. ??C?mo se trata? ?Realizar cambios en la dieta. ?Tomar medicamentos. ?Someterse a una cirug?a. ?El tratamiento depender? de la gravedad de los s?ntomas. ?Siga estas instrucciones en su casa: ?Comida y bebida ? ?Siga una dieta como se lo haya indicado el m?dico. Es posible que deba evitar alimentos y bebidas, por ejemplo: ?Caf? y t? negro, con o sin cafe?na. ?Bebidas que contengan alcohol. ?Bebidas energ?ticas y deportivas. ?Bebidas gaseosas (carbonatadas) y refrescos. ?Chocolate y cacao. ?Menta y esencia de menta. ?Ajo y cebolla. ?R?bano picante. ?Alimentos ?cidos y condimentados. Estos incluyen todos los tipos de pimientos, chile en polvo, curry en polvo, vinagre, salsas picantes y salsa barbacoa. ?C?tricos y sus jugos, por ejemplo, naranjas, limones y limas. ?Alimentos que contengan tomate. Estos incluyen salsa roja, chile, salsa picante y pizza con salsa de tomate. ?Alimentos fritos y grasos. Estos incluyen donas, papas fritas, papitas fritas de bolsa y aderezos con alto contenido de grasa. ?Carnes con alto contenido de grasa. Estas incluye los perros calientes, chuletas o costillas, embutidos, jam?n y tocino. ?Productos l?cteos ricos en grasas, como leche entera, manteca y queso crema. ?Consuma peque?as cantidades de comida con m?s frecuencia. Evite consumir porciones abundantes. ?Evite beber grandes cantidades de l?quidos con las comidas. ?Evite comer 2 o 3 horas antes de acostarse. ?Evite recostarse inmediatamente despu?s de comer. ?No haga ejercicios enseguida despu?s de comer. ?Estilo de vida ? ?No fume ni consuma ning?n producto que contenga nicotina o tabaco. Si necesita ayuda para dejar de consumir estos productos, consulte al m?dico. ?Intente reducir el nivel de estr?s. Si necesita ayuda para hacer esto, consulte al m?dico. ?Si tiene sobrepeso, baje una cantidad de peso saludable para usted. Consulte a   su m?dico para bajar de peso de manera segura. ?Instrucciones  generales ?Est? atento a cualquier cambio en los s?ntomas. ?Tome los medicamentos de venta libre y los recetados solamente como se lo haya indicado el m?dico. ?No tome aspirina, ibuprofeno ni otros antiinflamatorios no esteroideos (AINE) a menos que el m?dico lo autorice. ?Use ropa holgada. No use nada apretado alrededor de la cintura. ?Levante (eleve) la cabecera de la cama aproximadamente 6 pulgadas (15 cm). Para hacerlo, es posible que tenga que utilizar una cu?a. ?Evite inclinarse si al hacerlo empeoran los s?ntomas. ?Cumpla con todas las visitas de seguimiento. ?Comun?quese con un m?dico si: ?Aparecen nuevos s?ntomas. ?Adelgaza y no sabe por qu?. ?Tiene problemas para tragar o le duele cuando traga. ?Tiene sibilancias o tos persistente. ?Tiene la voz ronca. ?Los s?ntomas no mejoran con el tratamiento. ?Solicite ayuda de inmediato si: ?Siente dolor repentino en los brazos, el cuello, la mand?bula, los dientes o la espalda. ?De repente se siente transpirado, mareado o aturdido. ?Siente falta de aire o dolor en el pecho. ?Vomita y el v?mito es de color verde, amarillo o negro, o tiene un aspecto similar a la sangre o a los posos de caf?. ?Se desmaya. ?Las heces (deposiciones) son rojas, sanguinolentas o negras. ?No puede tragar, beber o comer. ?Estos s?ntomas pueden representar un problema grave que constituye una emergencia. No espere a ver si los s?ntomas desaparecen. Solicite atenci?n m?dica de inmediato. Comun?quese con el servicio de emergencias de su localidad (911 en los [place]). No conduzca por sus propios medios hasta el hospital. ?Resumen ?Si una persona tiene enfermedad de reflujo gastroesof?gico (ERGE), los alimentos y el ?cido estomacal suben al es?fago y causan s?ntomas o problemas tales como da?o en el es?fago. ?El tratamiento depender? de la gravedad de los s?ntomas. ?Siga una dieta como se lo haya indicado el m?dico. ?Use todos los medicamentos solamente como se lo haya indicado el  m?dico. ?Esta informaci?n no tiene como fin reemplazar el consejo del m?dico. Aseg?rese de hacerle al m?dico cualquier pregunta que tenga. ?Document Revised: 03/08/2020 Document Reviewed: 03/08/2020 ?Elsevier Patient Education ? 2023 [employer] ? ?

## 2022-11-11 ENCOUNTER — Other Ambulatory Visit: Payer: Self-pay

## 2022-11-16 ENCOUNTER — Encounter: Payer: PRIVATE HEALTH INSURANCE | Admitting: Obstetrics and Gynecology

## 2022-12-08 ENCOUNTER — Other Ambulatory Visit: Payer: Self-pay

## 2022-12-22 ENCOUNTER — Encounter: Payer: Self-pay | Admitting: Cardiology

## 2022-12-22 ENCOUNTER — Ambulatory Visit: Payer: PRIVATE HEALTH INSURANCE | Attending: Cardiology | Admitting: Cardiology

## 2022-12-22 VITALS — BP 120/70 | HR 87 | Ht 62.0 in | Wt 146.4 lb

## 2022-12-22 DIAGNOSIS — R079 Chest pain, unspecified: Secondary | ICD-10-CM | POA: Diagnosis not present

## 2022-12-22 DIAGNOSIS — Z01812 Encounter for preprocedural laboratory examination: Secondary | ICD-10-CM | POA: Diagnosis not present

## 2022-12-22 MED ORDER — METOPROLOL TARTRATE 100 MG PO TABS
100.0000 mg | ORAL_TABLET | ORAL | 0 refills | Status: DC
  Filled 2022-12-22: qty 1, 1d supply, fill #0

## 2022-12-22 NOTE — Progress Notes (Signed)
Cardiology Office Note:    Date:  12/22/2022   ID:  Connie Russell, DOB April 07, 1971, MRN [id number]  PCP:  Marcine Matar, MD   [facility] Providers Cardiologist:  None     Referring MD: Marcine Matar, MD    History of Present Illness:    Connie Russell is a 52 y.o. female here for the evaluation of chest pain at the request of Dr. Jonah Blue.  She has comorbidities of diabetes hyperlipidemia hypertension.  Also has GERD.  She has had chest discomfort that has been suspicious for possible angina.  Reports intermittent shortness of breath with chest discomfort for over a month that can be a pressure-like sensation lasting maybe a few minutes duration to an hour.  Can be located in the center of the chest, left arm. Palpitations.   Can be very uncomfortable.  Can be with either exertion or rest.  No radiation of symptoms.  Sometimes he may feel like something gets stuck in her chest especially in the morning after taking pills, but can happen in the afternoon..  She does have heartburn but this feels like a different type of discomfort. Wakes up at night like can't breath.   No TOB.  No early hx with CAD. Mother had heart surgery 20 years ago, pacemaker.   Past Medical History:  Diagnosis Date   Diabetes (HCC)    Diabetes mellitus without complication (HCC)    Gastritis    GERD (gastroesophageal reflux disease)    Hyperlipidemia    Hypertension     Past Surgical History:  Procedure Laterality Date   ESOPHAGOGASTRODUODENOSCOPY N/A 06/24/2015   SLF: 1. Abdominal pain most likely due to constipation, GERD, and Gastritis. 2. MIld NON-erosive gastritis.   FOOT SURGERY Left 2000   Local   PLANTAR'S WART EXCISION     UMBILICAL HERNIA REPAIR N/A 04/17/2020   Procedure: HERNIA REPAIR UMBILICAL ADULT, Open WITH MESH;  Surgeon: Henrene Dodge, MD;  Location: [facility] ORS;  Service: General;  Laterality: N/A;    Current Medications: Current Meds   Medication Sig   amLODipine (NORVASC) 5 MG tablet Take 1 tablet (5 mg total) by mouth daily.   Ascorbic Acid (VITAMIN C) 100 MG tablet Take 100 mg by mouth daily.   aspirin EC 81 MG tablet Take 1 tablet (81 mg total) by mouth daily.   atorvastatin (LIPITOR) 10 MG tablet Take 1 tablet (10 mg total) by mouth daily.   Blood Glucose Monitoring Suppl (TRUE METRIX METER) w/Device KIT Use as directed   cyanocobalamin (VITAMIN B12) 500 MCG tablet Take 500 mcg by mouth daily.   glimepiride (AMARYL) 2 MG tablet Take 1.5 tablets (3 mg total) by mouth daily before breakfast.   glucose blood (TRUE METRIX BLOOD GLUCOSE TEST) test strip Use as instructed   losartan (COZAAR) 100 MG tablet Take 1 tablet (100mg ) by mouth once daily.   magnesium gluconate (MAGONATE) 500 MG tablet Take 500 mg by mouth daily.   metFORMIN (GLUCOPHAGE) 1000 MG tablet Take 1 tablet (1,000 mg total) by mouth 2 (two) times daily with a meal.   metoprolol tartrate (LOPRESSOR) 100 MG tablet Take 1 tablet (100 mg total) by mouth as directed. Take 1 tablet 2 hours before your CT scan   Moringa Oleifera (MORINGA PO) Take 1 tablet by mouth daily.   omeprazole (PRILOSEC) 20 MG capsule Take 1 capsule (20 mg total) by mouth daily.   TRUEplus Lancets 28G MISC Use as directed   Vitamin  D, Cholecalciferol, 400 units CAPS Take 400 Units by mouth every morning.     Allergies:   Linzess [linaclotide]   Social History   Socioeconomic History   Marital status: Single    Spouse name: Not on file   Number of children: Not on file   Years of education: Not on file   Highest education level: Not on file  Occupational History   Not on file  Tobacco Use   Smoking status: Never   Smokeless tobacco: Never   Tobacco comments:    Never smoked  Vaping Use   Vaping Use: Never used  Substance and Sexual Activity   Alcohol use: No    Alcohol/week: 0.0 standard drinks of alcohol   Drug use: No   Sexual activity: Yes    Birth control/protection:  Pill, None  Other Topics Concern   Not on file  Social History Narrative   ** Merged History Encounter **       WORK FOR CLE[employer] IN [place].   Social Determinants of Health   Financial Resource Strain: Not on file  Food Insecurity: Not on file  Transportation Needs: Not on file  Physical Activity: Not on file  Stress: Not on file  Social Connections: Not on file     Family History: The patient's family history includes Arthritis in her mother; Diabetes in her maternal grandmother; Hypertension in her mother. There is no history of Colon cancer.  ROS:   Please see the history of present illness.    Denies any fevers chills nausea vomiting syncope bleeding all other systems reviewed and are negative.  EKGs/Labs/Other Studies Reviewed:    The following studies were reviewed today: Prior office note  EKG:  The ekg ordered today demonstrates NSR 87, IRBBB.  Recent Labs: 02/05/2022: ALT 24; BUN 10; Creatinine, Ser 0.65; Hemoglobin 13.7; Platelets 347; Potassium 4.1; Sodium 137  Recent Lipid Panel    Component Value Date/Time   CHOL 133 02/05/2022 1626   TRIG 198 (H) 02/05/2022 1626   HDL 43 02/05/2022 1626   CHOLHDL 3.1 02/05/2022 1626   CHOLHDL 4.3 09/09/2015 0942   VLDL 23 09/09/2015 0942   LDLCALC 58 02/05/2022 1626     Risk Assessment/Calculations:               Physical Exam:    VS:  BP 120/70   Pulse 87   Ht 5\' 2"  (1.575 m)   Wt 146 lb 6.4 oz (66.4 kg)   SpO2 96%   BMI 26.78 kg/m     Wt Readings from Last 3 Encounters:  12/22/22 146 lb 6.4 oz (66.4 kg)  11/10/22 150 lb (68 kg)  07/09/22 148 lb (67.1 kg)     GEN:  Well nourished, well developed in no acute distress HEENT: Normal NECK: No JVD; No carotid bruits LYMPHATICS: No lymphadenopathy CARDIAC: RRR, no murmurs, rubs, gallops RESPIRATORY:  Clear to auscultation without rales, wheezing or rhonchi  ABDOMEN: Soft, non-tender, non-distended MUSCULOSKELETAL:  No edema; No deformity  SKIN: Warm  and dry NEUROLOGIC:  Alert and oriented x 3 PSYCHIATRIC:  Normal affect   ASSESSMENT:    1. Pre-procedure lab exam   2. Chest pain of uncertain etiology    PLAN:    In order of problems listed above:  Chest pain with diabetes, hypertension hyperlipidemia -With her symptoms and several cardiac risk factors, we will go ahead and check a coronary CT scan.  Lab work.  We want to ensure that she does not  have any significant stenosis.  Risks and benefits explained to patient. -Atorvastatin 10 mg continue, LDL 58.  On metformin.  Hold for contrast.  Prior hemoglobin A1c 7.8.  Continue with amlodipine as well as losartan.         Medication Adjustments/Labs and Tests Ordered: Current medicines are reviewed at length with the patient today.  Concerns regarding medicines are outlined above.  Orders Placed This Encounter  Procedures   CT CORONARY MORPH W/CTA COR W/SCORE W/CA W/CM &/OR WO/CM   Basic metabolic panel   EKG 12-Lead   Meds ordered this encounter  Medications   metoprolol tartrate (LOPRESSOR) 100 MG tablet    Sig: Take 1 tablet (100 mg total) by mouth as directed. Take 1 tablet 2 hours before your CT scan    Dispense:  1 tablet    Refill:  0    Patient Instructions  Medication Instructions:  The current medical regimen is effective;  continue present plan and medications.  *If you need a refill on your cardiac medications before your next appointment, please call your pharmacy*   Lab Work: Please have blood work today (BMP)  If you have labs (blood work) drawn today and your tests are completely normal, you will receive your results only by: MyChart Message (if you have MyChart) OR A paper copy in the mail If you have any lab test that is abnormal or we need to change your treatment, we will call you to review the results.   Testing/Procedures:   Your cardiac CT will be scheduled at:   [facility] [address] [place], [place]  [address] [phone]  Please arrive at the [facility] and Children's Entrance (Entrance C2) of [facility] 30 minutes prior to test start time. You can use the FREE valet parking offered at entrance C (encouraged to control the heart rate for the test)  Proceed to the [facility] Radiology Department (first floor) to check-in and test prep.  All radiology patients and guests should use entrance C2 at [facility], accessed from [facility], even though the hospital's physical address listed is [address]    Please follow these instructions carefully (unless otherwise directed):  On the Night Before the Test: Be sure to Drink plenty of water. Do not consume any caffeinated/decaffeinated beverages or chocolate 12 hours prior to your test. Do not take any antihistamines 12 hours prior to your test.  On the Day of the Test: Drink plenty of water until 1 hour prior to the test. Do not eat any food 1 hour prior to test. You may take your regular medications prior to the test.  Take metoprolol (Lopressor) two hours prior to test. If you take Furosemide/Hydrochlorothiazide/Spironolactone, please HOLD on the morning of the test. FEMALES- please wear underwire-free bra if available, avoid dresses & tight clothing  After the Test: Drink plenty of water. After receiving IV contrast, you may experience a mild flushed feeling. This is normal. On occasion, you may experience a mild rash up to 24 hours after the test. This is not dangerous. If this occurs, you can take Benadryl 25 mg and increase your fluid intake. If you experience trouble breathing, this can be serious. If it is severe call 911 IMMEDIATELY. If it is mild, please call our office. If you take any of these medications: Glipizide/Metformin, Avandament, Glucavance, please do not take 48 hours after completing test unless otherwise instructed.  We will call to schedule your test  2-4 weeks out  understanding that some insurance companies will need an authorization prior to the service being performed.   For non-scheduling related questions, please contact the cardiac imaging nurse navigator should you have any questions/concerns: [place]well [place], Cardiac Imaging Nurse Navigator Larey Brick, Cardiac Imaging Nurse Navigator [facility] Direct Office Dial: [phone]   For scheduling needs, including cancellations and rescheduling, please call [place], [phone].   Follow-Up: At [facility], you and your health needs are our priority.  As part of our continuing mission to provide you with exceptional heart care, we have created designated Provider Care Teams.  These Care Teams include your primary Cardiologist (physician) and Advanced Practice Providers (APPs -  Physician Assistants and Nurse Practitioners) who all work together to provide you with the care you need, when you need it.  We recommend signing up for the patient portal called "MyChart".  Sign up information is provided on this After Visit Summary.  MyChart is used to connect with patients for Virtual Visits (Telemedicine).  Patients are able to view lab/test results, encounter notes, upcoming appointments, etc.  Non-urgent messages can be sent to your provider as well.   To learn more about what you can do with MyChart, go to [email or web address].    Your next appointment:   Follow up will be based on the results of the above testing.    Signed, Donato Schultz, MD  12/22/2022 5:27 PM     [facility]

## 2022-12-22 NOTE — Patient Instructions (Signed)
Medication Instructions:  The current medical regimen is effective;  continue present plan and medications.  *If you need a refill on your cardiac medications before your next appointment, please call your pharmacy*   Lab Work: Please have blood work today (BMP)  If you have labs (blood work) drawn today and your tests are completely normal, you will receive your results only by: MyChart Message (if you have MyChart) OR A paper copy in the mail If you have any lab test that is abnormal or we need to change your treatment, we will call you to review the results.   Testing/Procedures:   Your cardiac CT will be scheduled at:   [facility] [address] [place], [place] [address] [phone]  Please arrive at the Women's and Children's Entrance (Entrance C2) of [facility] 30 minutes prior to test start time. You can use the FREE valet parking offered at entrance C (encouraged to control the heart rate for the test)  Proceed to the [facility] Radiology Department (first floor) to check-in and test prep.  All radiology patients and guests should use entrance C2 at [facility], accessed from [address], even though the hospital's physical address listed is [address].     Please follow these instructions carefully (unless otherwise directed):  On the Night Before the Test: Be sure to Drink plenty of water. Do not consume any caffeinated/decaffeinated beverages or chocolate 12 hours prior to your test. Do not take any antihistamines 12 hours prior to your test.  On the Day of the Test: Drink plenty of water until 1 hour prior to the test. Do not eat any food 1 hour prior to test. You may take your regular medications prior to the test.  Take metoprolol (Lopressor) two hours prior to test. If you take Furosemide/Hydrochlorothiazide/Spironolactone, please HOLD on the morning of the test. FEMALES- please wear underwire-free  bra if available, avoid dresses & tight clothing  After the Test: Drink plenty of water. After receiving IV contrast, you may experience a mild flushed feeling. This is normal. On occasion, you may experience a mild rash up to 24 hours after the test. This is not dangerous. If this occurs, you can take Benadryl 25 mg and increase your fluid intake. If you experience trouble breathing, this can be serious. If it is severe call 911 IMMEDIATELY. If it is mild, please call our office. If you take any of these medications: Glipizide/Metformin, Avandament, Glucavance, please do not take 48 hours after completing test unless otherwise instructed.  We will call to schedule your test 2-4 weeks out understanding that some insurance companies will need an authorization prior to the service being performed.   For non-scheduling related questions, please contact the cardiac imaging nurse navigator should you have any questions/concerns: Connie Russell, Cardiac Imaging Nurse Navigator Connie Russell, Cardiac Imaging Nurse Navigator [facility] Direct Office Dial: [phone]   For scheduling needs, including cancellations and rescheduling, please call [place], [phone].  Follow-Up: At [facility], you and your health needs are our priority.  As part of our continuing mission to provide you with exceptional heart care, we have created designated Provider Care Teams.  These Care Teams include your primary Cardiologist (physician) and Advanced Practice Providers (APPs -  Physician Assistants and Nurse Practitioners) who all work together to provide you with the care you need, when you need it.  We recommend signing up for the patient portal   called "MyChart".  Sign up information is provided on this After Visit Summary.  MyChart is used to connect with patients for Virtual Visits (Telemedicine).  Patients are able to view lab/test results, encounter notes, upcoming  appointments, etc.  Non-urgent messages can be sent to your provider as well.   To learn more about what you can do with MyChart, go to [email or web address].    Your next appointment:   Follow up will be based on the results of the above testing. 

## 2022-12-23 ENCOUNTER — Other Ambulatory Visit: Payer: Self-pay

## 2022-12-23 LAB — BASIC METABOLIC PANEL
BUN/Creatinine Ratio: 24 — ABNORMAL HIGH (ref 9–23)
BUN: 14 mg/dL (ref 6–24)
CO2: 23 mmol/L (ref 20–29)
Calcium: 10.1 mg/dL (ref 8.7–10.2)
Chloride: 99 mmol/L (ref 96–106)
Creatinine, Ser: 0.58 mg/dL (ref 0.57–1.00)
Glucose: 168 mg/dL — ABNORMAL HIGH (ref 70–99)
Potassium: 4.1 mmol/L (ref 3.5–5.2)
Sodium: 138 mmol/L (ref 134–144)
eGFR: 110 mL/min/{1.73_m2} (ref >59)

## 2023-01-08 ENCOUNTER — Encounter: Payer: Self-pay | Admitting: *Deleted

## 2023-01-20 ENCOUNTER — Other Ambulatory Visit: Payer: Self-pay | Admitting: Internal Medicine

## 2023-01-20 ENCOUNTER — Other Ambulatory Visit: Payer: Self-pay

## 2023-01-20 DIAGNOSIS — IMO0001 Reserved for inherently not codable concepts without codable children: Secondary | ICD-10-CM

## 2023-01-20 DIAGNOSIS — E119 Type 2 diabetes mellitus without complications: Secondary | ICD-10-CM

## 2023-01-20 MED ORDER — TRUE METRIX BLOOD GLUCOSE TEST VI STRP
ORAL_STRIP | 1 refills | Status: AC
Start: 2023-01-20 — End: ?
  Filled 2023-01-20: qty 100, fill #0

## 2023-01-21 ENCOUNTER — Other Ambulatory Visit: Payer: Self-pay

## 2023-01-26 ENCOUNTER — Other Ambulatory Visit: Payer: Self-pay

## 2023-01-28 ENCOUNTER — Other Ambulatory Visit: Payer: Self-pay

## 2023-02-18 ENCOUNTER — Other Ambulatory Visit: Payer: Self-pay | Admitting: Internal Medicine

## 2023-02-18 ENCOUNTER — Other Ambulatory Visit: Payer: Self-pay

## 2023-02-18 DIAGNOSIS — K219 Gastro-esophageal reflux disease without esophagitis: Secondary | ICD-10-CM

## 2023-02-18 MED ORDER — OMEPRAZOLE 20 MG PO CPDR
20.0000 mg | DELAYED_RELEASE_CAPSULE | Freq: Every day | ORAL | 0 refills | Status: DC
Start: 2023-02-18 — End: 2023-06-15
  Filled 2023-02-18: qty 90, 90d supply, fill #0
  Filled 2023-02-23: qty 30, 30d supply, fill #0
  Filled 2023-04-11 – 2023-04-16 (×2): qty 30, 30d supply, fill #1
  Filled 2023-05-12: qty 30, 30d supply, fill #2

## 2023-02-19 ENCOUNTER — Other Ambulatory Visit: Payer: Self-pay

## 2023-02-23 ENCOUNTER — Other Ambulatory Visit: Payer: Self-pay

## 2023-02-23 ENCOUNTER — Other Ambulatory Visit: Payer: Self-pay | Admitting: Cardiology

## 2023-02-25 ENCOUNTER — Other Ambulatory Visit: Payer: Self-pay

## 2023-03-11 ENCOUNTER — Other Ambulatory Visit: Payer: Self-pay

## 2023-03-12 ENCOUNTER — Ambulatory Visit: Payer: PRIVATE HEALTH INSURANCE | Attending: Internal Medicine | Admitting: Internal Medicine

## 2023-03-12 ENCOUNTER — Encounter: Payer: Self-pay | Admitting: Internal Medicine

## 2023-03-12 ENCOUNTER — Other Ambulatory Visit: Payer: Self-pay

## 2023-03-12 VITALS — BP 115/74 | HR 86 | Temp 98.2°F | Ht 62.0 in | Wt 148.0 lb

## 2023-03-12 DIAGNOSIS — I152 Hypertension secondary to endocrine disorders: Secondary | ICD-10-CM

## 2023-03-12 DIAGNOSIS — E1169 Type 2 diabetes mellitus with other specified complication: Secondary | ICD-10-CM

## 2023-03-12 DIAGNOSIS — E782 Mixed hyperlipidemia: Secondary | ICD-10-CM | POA: Diagnosis not present

## 2023-03-12 DIAGNOSIS — E1159 Type 2 diabetes mellitus with other circulatory complications: Secondary | ICD-10-CM

## 2023-03-12 DIAGNOSIS — R079 Chest pain, unspecified: Secondary | ICD-10-CM | POA: Diagnosis not present

## 2023-03-12 DIAGNOSIS — Z7984 Long term (current) use of oral hypoglycemic drugs: Secondary | ICD-10-CM

## 2023-03-12 LAB — GLUCOSE, POCT (MANUAL RESULT ENTRY): POC Glucose: 120 mg/dl — AB (ref 70–99)

## 2023-03-12 LAB — POCT GLYCOSYLATED HEMOGLOBIN (HGB A1C): HbA1c, POC (controlled diabetic range): 8 % — AB (ref 0.0–7.0)

## 2023-03-12 MED ORDER — METFORMIN HCL 1000 MG PO TABS
1000.0000 mg | ORAL_TABLET | Freq: Two times a day (BID) | ORAL | 1 refills | Status: DC
Start: 2023-03-12 — End: 2023-11-19
  Filled 2023-06-14: qty 180, 90d supply, fill #0
  Filled 2023-09-14: qty 180, 90d supply, fill #1

## 2023-03-12 MED ORDER — ATORVASTATIN CALCIUM 10 MG PO TABS
10.0000 mg | ORAL_TABLET | Freq: Every day | ORAL | 1 refills | Status: DC
Start: 2023-03-12 — End: 2023-11-19
  Filled 2023-06-14: qty 90, 90d supply, fill #0
  Filled 2023-09-14: qty 90, 90d supply, fill #1

## 2023-03-12 MED ORDER — GLIMEPIRIDE 2 MG PO TABS
4.0000 mg | ORAL_TABLET | Freq: Every day | ORAL | 1 refills | Status: DC
  Filled 2023-03-12 – 2023-04-27 (×2): qty 180, 90d supply, fill #0
  Filled 2023-07-28: qty 180, 90d supply, fill #1

## 2023-03-12 MED ORDER — LOSARTAN POTASSIUM 100 MG PO TABS
100.0000 mg | ORAL_TABLET | Freq: Every day | ORAL | 1 refills | Status: DC
Start: 2023-03-12 — End: 2023-11-19
  Filled 2023-03-12 – 2023-05-17 (×2): qty 90, 90d supply, fill #0
  Filled 2023-09-14: qty 90, 90d supply, fill #1

## 2023-03-12 NOTE — Progress Notes (Signed)
Patient ID: Connie Russell, female    DOB: 12-07-1970  MRN: [id number]  CC: Diabetes (DM f/u. Med refills. /Discuss glimepiride)   Subjective: Connie Russell is a 52 y.o. female who presents for chronic ds management Her concerns today include:  Patient with history of diabetes type 2, HTN, HL, constipation, vitamin D deficiency.    AMN Language interpreter used during this encounter. #[id number] Russell   DM: Results for orders placed or performed in visit on 03/12/23  POCT glycosylated hemoglobin (Hb A1C)  Result Value Ref Range   Hemoglobin A1C     HbA1c POC (<> result, manual entry)     HbA1c, POC (prediabetic range)     HbA1c, POC (controlled diabetic range) 8.0 (A) 0.0 - 7.0 %  POCT glucose (manual entry)  Result Value Ref Range   POC Glucose 120 (A) 70 - 99 mg/dl  Should be on Metformin 1 gram BID and Amaryl 3 mg daily.  Reports compliance with meds. Does not have Amaryl bottle with her today.  States she already put in for RF at pharmacy for Amaryl. Reports eating habits have been poor over past 2 wks because had family visiting from [place] Had eye exam done 1 mth ago at [facility] in [place].  HTN: Reports compliance with Norvasc 5 mg daily and Cozaar 100 mg daily. Checks BP occasionally; reports good #s Saw cardiology, Dr. Anne Fu, for CP since last visit.  Coronary CT ordered but not done.  Pt states she was told she will be called for appt but she was never called.  Still gets chest pains intermittently with exertion.  HL: Taking and tolerating atorvastatin.  Patient Active Problem List   Diagnosis Date Noted   Hyperlipidemia associated with type 2 diabetes mellitus (HCC) 11/10/2022   Wart on thumb 05/14/2022   Umbilical hernia without obstruction and without gangrene    Benign paroxysmal positional vertigo 06/04/2017   Diabetes mellitus type 2, controlled (HCC) 03/18/2016   Gastritis 09/16/2015   Dyspepsia 06/13/2015   Constipation  01/15/2014   GERD (gastroesophageal reflux disease) 01/15/2014   Hypertension, essential 08/14/2013   Other and unspecified hyperlipidemia 08/14/2013   Preventative health care 07/14/2013     Current Outpatient Medications on File Prior to Visit  Medication Sig Dispense Refill   amLODipine (NORVASC) 5 MG tablet Take 1 tablet (5 mg total) by mouth daily. 90 tablet 1   Ascorbic Acid (VITAMIN C) 100 MG tablet Take 100 mg by mouth daily.     aspirin EC 81 MG tablet Take 1 tablet (81 mg total) by mouth daily. 30 tablet 11   atorvastatin (LIPITOR) 10 MG tablet Take 1 tablet (10 mg total) by mouth daily. 90 tablet 1   Blood Glucose Monitoring Suppl (TRUE METRIX METER) w/Device KIT Use as directed 1 kit 0   glucose blood (TRUE METRIX BLOOD GLUCOSE TEST) test strip Use as instructed 100 each 1   magnesium gluconate (MAGONATE) 500 MG tablet Take 500 mg by mouth daily.     metFORMIN (GLUCOPHAGE) 1000 MG tablet Take 1 tablet (1,000 mg total) by mouth 2 (two) times daily with a meal. 180 tablet 1   Moringa Oleifera (MORINGA PO) Take 1 tablet by mouth daily.     omeprazole (PRILOSEC) 20 MG capsule Take 1 capsule (20 mg total) by mouth daily. 90 capsule 0   TRUEplus Lancets 28G MISC Use as directed 100 each 6   Vitamin D, Cholecalciferol, 400 units CAPS Take 400 Units by  mouth every morning. 90 capsule 0   cyanocobalamin (VITAMIN B12) 500 MCG tablet Take 500 mcg by mouth daily. (Patient not taking: Reported on 03/12/2023)     glimepiride (AMARYL) 2 MG tablet Take 1.5 tablets (3 mg total) by mouth daily before breakfast. (Patient not taking: Reported on 03/12/2023) 135 tablet 1   losartan (COZAAR) 100 MG tablet Take 1 tablet (100mg ) by mouth once daily. (Patient not taking: Reported on 03/12/2023) 90 tablet 1   metoprolol tartrate (LOPRESSOR) 100 MG tablet Take 1 tablet (100 mg total) by mouth as directed. Take 1 tablet 2 hours before your CT scan (Patient not taking: Reported on 03/12/2023) 1 tablet 0   No  current facility-administered medications on file prior to visit.    Allergies  Allergen Reactions   Linzess [Linaclotide] Diarrhea    DIARRHEA AT 145 MICROG    Social History   Socioeconomic History   Marital status: Single    Spouse name: Not on file   Number of children: Not on file   Years of education: Not on file   Highest education level: Not on file  Occupational History   Not on file  Tobacco Use   Smoking status: Never   Smokeless tobacco: Never   Tobacco comments:    Never smoked  Vaping Use   Vaping status: Never Used  Substance and Sexual Activity   Alcohol use: No    Alcohol/week: 0.0 standard drinks of alcohol   Drug use: No   Sexual activity: Yes    Birth control/protection: Pill, None  Other Topics Concern   Not on file  Social History Narrative   ** Merged History Encounter **       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   Social Determinants of Health   Financial Resource Strain: Not on file  Food Insecurity: Not on file  Transportation Needs: Not on file  Physical Activity: Not on file  Stress: Not on file  Social Connections: Not on file  Intimate Partner Violence: Not on file    Family History  Problem Relation Age of Onset   Hypertension Mother    Arthritis Mother    Diabetes Maternal Grandmother    Colon cancer Neg Hx     Past Surgical History:  Procedure Laterality Date   ESOPHAGOGASTRODUODENOSCOPY N/A 06/24/2015   SLF: 1. Abdominal pain most likely due to constipation, GERD, and Gastritis. 2. MIld NON-erosive gastritis.   FOOT SURGERY Left 2000   Local   PLANTAR'S WART EXCISION     UMBILICAL HERNIA REPAIR N/A 04/17/2020   Procedure: HERNIA REPAIR UMBILICAL ADULT, Open WITH MESH;  Surgeon: Henrene Dodge, MD;  Location: [facility] ORS;  Service: General;  Laterality: N/A;    ROS: Review of Systems Negative except as stated above  PHYSICAL EXAM: BP 115/74 (BP Location: Left Arm, Patient Position: Sitting, Cuff Size: Normal)   Pulse 86    Temp 98.2 F (36.8 C) (Oral)   Ht 5\' 2"  (1.575 m)   Wt 148 lb (67.1 kg)   SpO2 96%   BMI 27.07 kg/m   Physical Exam  General appearance - alert, well appearing, and in no distress Mental status - normal mood, behavior, speech, dress, motor activity, and thought processes Neck - supple, no significant adenopathy Chest - clear to auscultation, no wheezes, rales or rhonchi, symmetric air entry Heart - normal rate, regular rhythm, normal S1, S2, no murmurs, rubs, clicks or gallops Extremities - peripheral pulses normal, no pedal edema, no clubbing or cyanosis  Latest Ref Rng & Units 12/22/2022    4:51 PM 02/05/2022    4:26 PM 12/09/2020    8:50 AM  CMP  Glucose 70 - 99 mg/dL 846  962  952   BUN 6 - 24 mg/dL 14  10  8    Creatinine 0.57 - 1.00 mg/dL 8.41  3.24  4.01   Sodium 134 - 144 mmol/L 138  137  139   Potassium 3.5 - 5.2 mmol/L 4.1  4.1  4.3   Chloride 96 - 106 mmol/L 99  100  101   CO2 20 - 29 mmol/L 23  22  22    Calcium 8.7 - 10.2 mg/dL 02.7  9.7  9.4   Total Protein 6.0 - 8.5 g/dL  7.2  7.2   Total Bilirubin 0.0 - 1.2 mg/dL  0.3  0.6   Alkaline Phos 44 - 121 IU/L  82  59   AST 0 - 40 IU/L  19  15   ALT 0 - 32 IU/L  24  20    Lipid Panel     Component Value Date/Time   CHOL 133 02/05/2022 1626   TRIG 198 (H) 02/05/2022 1626   HDL 43 02/05/2022 1626   CHOLHDL 3.1 02/05/2022 1626   CHOLHDL 4.3 09/09/2015 0942   VLDL 23 09/09/2015 0942   LDLCALC 58 02/05/2022 1626    CBC    Component Value Date/Time   WBC 7.3 02/05/2022 1626   WBC 5.8 03/04/2017 1244   RBC 4.41 02/05/2022 1626   RBC 4.40 03/04/2017 1244   HGB 13.7 02/05/2022 1626   HCT 39.6 02/05/2022 1626   PLT 347 02/05/2022 1626   MCV 90 02/05/2022 1626   MCH 31.1 02/05/2022 1626   MCH 30.9 03/04/2017 1244   MCHC 34.6 02/05/2022 1626   MCHC 35.3 03/04/2017 1244   RDW 11.6 (L) 02/05/2022 1626   LYMPHSABS 2.6 02/05/2022 1626   MONOABS 0.2 03/04/2017 1244   EOSABS 0.2 02/05/2022 1626   BASOSABS 0.1  02/05/2022 1626    ASSESSMENT AND PLAN:  1. DM type 2 with diabetic mixed hyperlipidemia (HCC) Not at goal.  She attributes this to dietary indiscretions over the past 2 weeks.  Discussed and encourage healthy eating habits. Increase Amaryl to 4 mg daily.  Continue metformin 1 g twice a day. - POCT glycosylated hemoglobin (Hb A1C) - POCT glucose (manual entry) - metFORMIN (GLUCOPHAGE) 1000 MG tablet; Take 1 tablet (1,000 mg total) by mouth 2 (two) times daily with a meal.  Dispense: 180 tablet; Refill: 1 - atorvastatin (LIPITOR) 10 MG tablet; Take 1 tablet (10 mg total) by mouth daily.  Dispense: 90 tablet; Refill: 1 - losartan (COZAAR) 100 MG tablet; Take 1 tablet (100mg ) by mouth once daily.  Dispense: 90 tablet; Refill: 1 - CBC; Future - Comprehensive metabolic panel; Future - Lipid panel; Future - glimepiride (AMARYL) 2 MG tablet; Take 2 tablets (4 mg total) by mouth daily before breakfast.  Dispense: 180 tablet; Refill: 1  2. Hypertension associated with type 2 diabetes mellitus (HCC) Continue losartan 100 mg daily and amlodipine 5 mg daily. - metFORMIN (GLUCOPHAGE) 1000 MG tablet; Take 1 tablet (1,000 mg total) by mouth 2 (two) times daily with a meal.  Dispense: 180 tablet; Refill: 1 - losartan (COZAAR) 100 MG tablet; Take 1 tablet (100mg ) by mouth once daily.  Dispense: 90 tablet; Refill: 1  3. Chest pain in adult Message sent to Dr. Anne Fu to let him know that patient was never called  for coronary CT.   Patient was given the opportunity to ask questions.  Patient verbalized understanding of the plan and was able to repeat key elements of the plan.   This documentation was completed using Paediatric nurse.  Any transcriptional errors are unintentional.  Orders Placed This Encounter  Procedures   POCT glycosylated hemoglobin (Hb A1C)   POCT glucose (manual entry)     Requested Prescriptions   Pending Prescriptions Disp Refills   metFORMIN  (GLUCOPHAGE) 1000 MG tablet 180 tablet 1    Sig: Take 1 tablet (1,000 mg total) by mouth 2 (two) times daily with a meal.   atorvastatin (LIPITOR) 10 MG tablet 90 tablet 1    Sig: Take 1 tablet (10 mg total) by mouth daily.   losartan (COZAAR) 100 MG tablet 90 tablet 1    Sig: Take 1 tablet (100mg ) by mouth once daily.    No follow-ups on file.  Jonah Blue, MD, FACP

## 2023-03-17 ENCOUNTER — Telehealth: Payer: Self-pay | Admitting: Internal Medicine

## 2023-03-17 NOTE — Telephone Encounter (Signed)
-----   Message from Nurse Daisy Blossom sent at 03/16/2023  9:56 AM EDT ----- Regarding: RE: Coronary CT Thank you [place].  Dr. Laural Benes she's been scheduled for 03/19/23 to come in for her CCTA.  Thank you, Rockwell Alexandria RN Navigator Cardiac Imaging [facility] [phone] Office  [phone] Cell ----- Message ----- From: Lorrin Jackson Sent: 03/16/2023   9:39 AM EDT To: Lennie Odor, RN; Sharin Grave, RN Subject: RE: Coronary CT                                Good Morning,  I have been calling her to  schedule her and leaving messages. I just finally scheduled her. She said she do work a lot and can't be on her phone [place] Dec she asked for a message to be left when she Is contacted with the details.  Thanks, [place] ----- Message ----- From: Sharin Grave, RN Sent: 03/16/2023   8:39 AM EDT To: Lennie Odor, RN; Sharin Grave, RN; # Subject: Annell Greening: Coronary CT                                 ----- Message ----- From: Jake Bathe, MD Sent: 03/13/2023   7:19 AM EDT To: Sharin Grave, RN Subject: FW: Coronary CT                                 ----- Message ----- From: Marcine Matar, MD Sent: 03/12/2023   5:12 PM EDT To: Jake Bathe, MD Subject: Coronary CT                                    Good afternoon.  I am the PCP for this patient.  You saw her in May for chest pains and had ordered coronary CT.  Patient states she was told she would be called with an appointment for the test but never did receive a call.  I was not sure who to direct this to.

## 2023-03-18 ENCOUNTER — Ambulatory Visit: Payer: PRIVATE HEALTH INSURANCE | Attending: Internal Medicine

## 2023-03-19 ENCOUNTER — Ambulatory Visit (HOSPITAL_COMMUNITY): Admission: RE | Admit: 2023-03-19 | Payer: PRIVATE HEALTH INSURANCE | Source: Ambulatory Visit

## 2023-03-22 ENCOUNTER — Other Ambulatory Visit: Payer: Self-pay | Admitting: *Deleted

## 2023-03-22 DIAGNOSIS — R079 Chest pain, unspecified: Secondary | ICD-10-CM

## 2023-03-23 ENCOUNTER — Ambulatory Visit: Payer: PRIVATE HEALTH INSURANCE | Attending: Internal Medicine

## 2023-03-23 ENCOUNTER — Telehealth: Payer: Self-pay

## 2023-03-23 ENCOUNTER — Other Ambulatory Visit: Payer: Self-pay

## 2023-03-23 NOTE — Telephone Encounter (Signed)
Patient came in person today and stated that she was unable to have her CCTA imaging appointment done due to insurance being OON and unable to obtain a prior authorization. Please advise.

## 2023-03-24 NOTE — Telephone Encounter (Signed)
Called & spoke to the patient. Verified name & DOB. Informed of name and number of cardiologist. Patient expressed verbal understanding and will contact cardiology for further information. No further questions at this time.

## 2023-04-15 ENCOUNTER — Other Ambulatory Visit: Payer: Self-pay

## 2023-04-16 ENCOUNTER — Other Ambulatory Visit: Payer: Self-pay

## 2023-04-28 ENCOUNTER — Other Ambulatory Visit: Payer: Self-pay

## 2023-04-29 ENCOUNTER — Other Ambulatory Visit: Payer: Self-pay

## 2023-05-17 ENCOUNTER — Other Ambulatory Visit: Payer: Self-pay

## 2023-06-15 ENCOUNTER — Other Ambulatory Visit: Payer: Self-pay | Admitting: Internal Medicine

## 2023-06-15 ENCOUNTER — Other Ambulatory Visit: Payer: Self-pay

## 2023-06-15 DIAGNOSIS — K219 Gastro-esophageal reflux disease without esophagitis: Secondary | ICD-10-CM

## 2023-06-16 ENCOUNTER — Other Ambulatory Visit: Payer: Self-pay

## 2023-06-16 MED ORDER — OMEPRAZOLE 20 MG PO CPDR
20.0000 mg | DELAYED_RELEASE_CAPSULE | Freq: Every day | ORAL | 0 refills | Status: DC
  Filled 2023-06-16: qty 30, 30d supply, fill #0
  Filled 2023-07-15: qty 30, 30d supply, fill #1
  Filled 2023-08-16 (×2): qty 30, 30d supply, fill #2

## 2023-06-17 ENCOUNTER — Other Ambulatory Visit: Payer: Self-pay

## 2023-07-15 ENCOUNTER — Other Ambulatory Visit: Payer: Self-pay

## 2023-07-15 ENCOUNTER — Encounter: Payer: Self-pay | Admitting: Internal Medicine

## 2023-07-15 ENCOUNTER — Ambulatory Visit: Payer: No Typology Code available for payment source | Attending: Internal Medicine | Admitting: Internal Medicine

## 2023-07-15 VITALS — BP 116/75 | HR 80 | Ht 62.0 in | Wt 144.0 lb

## 2023-07-15 DIAGNOSIS — Z23 Encounter for immunization: Secondary | ICD-10-CM | POA: Diagnosis not present

## 2023-07-15 DIAGNOSIS — E782 Mixed hyperlipidemia: Secondary | ICD-10-CM

## 2023-07-15 DIAGNOSIS — I152 Hypertension secondary to endocrine disorders: Secondary | ICD-10-CM

## 2023-07-15 DIAGNOSIS — E119 Type 2 diabetes mellitus without complications: Secondary | ICD-10-CM

## 2023-07-15 DIAGNOSIS — Z1211 Encounter for screening for malignant neoplasm of colon: Secondary | ICD-10-CM

## 2023-07-15 DIAGNOSIS — E1159 Type 2 diabetes mellitus with other circulatory complications: Secondary | ICD-10-CM | POA: Diagnosis not present

## 2023-07-15 DIAGNOSIS — Z7984 Long term (current) use of oral hypoglycemic drugs: Secondary | ICD-10-CM

## 2023-07-15 DIAGNOSIS — E1169 Type 2 diabetes mellitus with other specified complication: Secondary | ICD-10-CM | POA: Diagnosis not present

## 2023-07-15 DIAGNOSIS — M79672 Pain in left foot: Secondary | ICD-10-CM | POA: Diagnosis not present

## 2023-07-15 LAB — GLUCOSE, POCT (MANUAL RESULT ENTRY): POC Glucose: 139 mg/dL — AB (ref 70–99)

## 2023-07-15 LAB — POCT GLYCOSYLATED HEMOGLOBIN (HGB A1C): HbA1c, POC (controlled diabetic range): 7.4 % — AB (ref 0.0–7.0)

## 2023-07-15 NOTE — Progress Notes (Signed)
Patient ID: RANA BUHAY, female    DOB: 04/13/1971  MRN: [id number]  CC: Diabetes (DM f/u./Swelling on bottom of L foot X2 weeks, painful X2 weeks/Yes to flu vax. )   Subjective: Cosma Aguilar-Reyes is a 52 y.o. female who presents for chronic ds management. Her concerns today include:  Patient with history of diabetes type 2, HTN, HL, constipation, vitamin D deficiency.    AMN Language interpreter used during this encounter. Paty [id number]  HM:  yes to flu shot and Tdapt.  Had eye exam 03/2023 at [facility] in [place]. Due for FIT test  DM: Results for orders placed or performed in visit on 07/15/23  POCT glycosylated hemoglobin (Hb A1C)  Result Value Ref Range   Hemoglobin A1C     HbA1c POC (<> result, manual entry)     HbA1c, POC (prediabetic range)     HbA1c, POC (controlled diabetic range) 7.4 (A) 0.0 - 7.0 %  POCT glucose (manual entry)  Result Value Ref Range   POC Glucose 139 (A) 70 - 99 mg/dl  O1H 4 mths ago was 8. Reports compliance with Metformin 1 gram BID and Amaryl 4 mg daily Needs new DM testing supplies; machine no longer works Does well with eating habits Does an on-line exercise program 3x/wk.   HTN:  Reports compliance with Norvasc 5 mg daily and Cozaar 100 mg daily. Limits salt in foods.  No CP/SOB HL:  taking and tolerating Lipitor 10 mg  C/o swelling on ball of LT foot x 2 wks.  No known injury to foot. Wears plastic cloggs at work but has worn them for a long time. Purchase a soft insole to wear in work shoes.  Socking foot in warm water with salt.  Helps some. Patient Active Problem List   Diagnosis Date Noted   Hyperlipidemia associated with type 2 diabetes mellitus (HCC) 11/10/2022   Wart on thumb 05/14/2022   Umbilical hernia without obstruction and without gangrene    Benign paroxysmal positional vertigo 06/04/2017   Diabetes mellitus type 2, controlled (HCC) 03/18/2016   Gastritis 09/16/2015   Dyspepsia 06/13/2015   Constipation  01/15/2014   GERD (gastroesophageal reflux disease) 01/15/2014   Hypertension, essential 08/14/2013   Other and unspecified hyperlipidemia 08/14/2013   Preventative health care 07/14/2013     Current Outpatient Medications on File Prior to Visit  Medication Sig Dispense Refill   amLODipine (NORVASC) 5 MG tablet Take 1 tablet (5 mg total) by mouth daily. 90 tablet 1   Ascorbic Acid (VITAMIN C) 100 MG tablet Take 100 mg by mouth daily.     aspirin EC 81 MG tablet Take 1 tablet (81 mg total) by mouth daily. 30 tablet 11   atorvastatin (LIPITOR) 10 MG tablet Take 1 tablet (10 mg total) by mouth daily. 90 tablet 1   Blood Glucose Monitoring Suppl (TRUE METRIX METER) w/Device KIT Use as directed 1 kit 0   cyanocobalamin (VITAMIN B12) 500 MCG tablet Take 500 mcg by mouth daily.     glimepiride (AMARYL) 2 MG tablet Take 2 tablets (4 mg total) by mouth daily before breakfast. 180 tablet 1   glucose blood (TRUE METRIX BLOOD GLUCOSE TEST) test strip Use as instructed 100 each 1   losartan (COZAAR) 100 MG tablet Take 1 tablet (100mg ) by mouth once daily. 90 tablet 1   magnesium gluconate (MAGONATE) 500 MG tablet Take 500 mg by mouth daily.     metFORMIN (GLUCOPHAGE) 1000 MG tablet Take 1 tablet (  1,000 mg total) by mouth 2 (two) times daily with a meal. 180 tablet 1   metoprolol tartrate (LOPRESSOR) 100 MG tablet Take 1 tablet (100 mg total) by mouth as directed. Take 1 tablet 2 hours before your CT scan 1 tablet 0   Moringa Oleifera (MORINGA PO) Take 1 tablet by mouth daily.     omeprazole (PRILOSEC) 20 MG capsule Take 1 capsule (20 mg total) by mouth daily. 90 capsule 0   TRUEplus Lancets 28G MISC Use as directed 100 each 6   Vitamin D, Cholecalciferol, 400 units CAPS Take 400 Units by mouth every morning. 90 capsule 0   No current facility-administered medications on file prior to visit.    Allergies  Allergen Reactions   Linzess [Linaclotide] Diarrhea    DIARRHEA AT 145 MICROG    Social  History   Socioeconomic History   Marital status: Single    Spouse name: Not on file   Number of children: Not on file   Years of education: Not on file   Highest education level: Not on file  Occupational History   Not on file  Tobacco Use   Smoking status: Never   Smokeless tobacco: Never   Tobacco comments:    Never smoked  Vaping Use   Vaping status: Never Used  Substance and Sexual Activity   Alcohol use: No    Alcohol/week: 0.0 standard drinks of alcohol   Drug use: No   Sexual activity: Yes    Birth control/protection: Pill, None  Other Topics Concern   Not on file  Social History Narrative   ** Merged History Encounter **       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   Social Determinants of Health   Financial Resource Strain: Not on file  Food Insecurity: Food Insecurity Present (07/15/2023)   Hunger Vital Sign    Worried About Running Out of Food in the Last Year: Never true    Ran Out of Food in the Last Year: Sometimes true  Transportation Needs: No Transportation Needs (07/15/2023)   PRAPARE - Administrator, Civil Service (Medical): No    Lack of Transportation (Non-Medical): No  Physical Activity: Not on file  Stress: Not on file  Social Connections: Not on file  Intimate Partner Violence: Not At Risk (07/15/2023)   Humiliation, Afraid, Rape, and Kick questionnaire    Fear of Current or Ex-Partner: No    Emotionally Abused: No    Physically Abused: No    Sexually Abused: No    Family History  Problem Relation Age of Onset   Hypertension Mother    Arthritis Mother    Diabetes Maternal Grandmother    Colon cancer Neg Hx     Past Surgical History:  Procedure Laterality Date   ESOPHAGOGASTRODUODENOSCOPY N/A 06/24/2015   SLF: 1. Abdominal pain most likely due to constipation, GERD, and Gastritis. 2. MIld NON-erosive gastritis.   FOOT SURGERY Left 2000   Local   PLANTAR'S WART EXCISION     UMBILICAL HERNIA REPAIR N/A 04/17/2020   Procedure: HERNIA  REPAIR UMBILICAL ADULT, Open WITH MESH;  Surgeon: Henrene Dodge, MD;  Location: [facility] ORS;  Service: General;  Laterality: N/A;    ROS: Review of Systems Negative except as stated above  PHYSICAL EXAM: BP 116/75 (BP Location: Left Arm, Patient Position: Sitting, Cuff Size: Normal)   Pulse 80   Ht 5\' 2"  (1.575 m)   Wt 144 lb (65.3 kg)   SpO2 98%  BMI 26.34 kg/m   Wt Readings from Last 3 Encounters:  07/15/23 144 lb (65.3 kg)  03/12/23 148 lb (67.1 kg)  12/22/22 146 lb 6.4 oz (66.4 kg)    Physical Exam  General appearance - alert, well appearing, and in no distress Mental status - normal mood, behavior, speech, dress, motor activity, and thought processes Neck - supple, no significant adenopathy Chest - clear to auscultation, no wheezes, rales or rhonchi, symmetric air entry Heart - normal rate, regular rhythm, normal S1, S2, no murmurs, rubs, clicks or gallops Extremities - peripheral pulses normal, no pedal edema, no clubbing or cyanosis MSK: Mild swelling of the ball of the left foot.  Feels to be questionably a cyst that is about 3 cm in size.  No fluctuance or erythema     Latest Ref Rng & Units 03/23/2023   10:32 AM 12/22/2022    4:51 PM 02/05/2022    4:26 PM  CMP  Glucose 70 - 99 mg/dL 696  295  284   BUN 6 - 24 mg/dL 13  14  10    Creatinine 0.57 - 1.00 mg/dL 1.32  4.40  1.02   Sodium 134 - 144 mmol/L 135  138  137   Potassium 3.5 - 5.2 mmol/L 4.6  4.1  4.1   Chloride 96 - 106 mmol/L 98  99  100   CO2 20 - 29 mmol/L 25  23  22    Calcium 8.7 - 10.2 mg/dL 9.4  72.5  9.7   Total Protein 6.0 - 8.5 g/dL 6.8   7.2   Total Bilirubin 0.0 - 1.2 mg/dL 0.5   0.3   Alkaline Phos 44 - 121 IU/L 72   82   AST 0 - 40 IU/L 16   19   ALT 0 - 32 IU/L 20   24    Lipid Panel     Component Value Date/Time   CHOL 144 03/23/2023 1032   TRIG 233 (H) 03/23/2023 1032   HDL 47 03/23/2023 1032   CHOLHDL 3.1 03/23/2023 1032   CHOLHDL 4.3 09/09/2015 0942   VLDL 23 09/09/2015 0942    LDLCALC 60 03/23/2023 1032    CBC    Component Value Date/Time   WBC 7.2 03/23/2023 1032   WBC 5.8 03/04/2017 1244   RBC 4.52 03/23/2023 1032   RBC 4.40 03/04/2017 1244   HGB 14.2 03/23/2023 1032   HCT 40.4 03/23/2023 1032   PLT 333 03/23/2023 1032   MCV 89 03/23/2023 1032   MCH 31.4 03/23/2023 1032   MCH 30.9 03/04/2017 1244   MCHC 35.1 03/23/2023 1032   MCHC 35.3 03/04/2017 1244   RDW 11.7 03/23/2023 1032   LYMPHSABS 2.6 02/05/2022 1626   MONOABS 0.2 03/04/2017 1244   EOSABS 0.2 02/05/2022 1626   BASOSABS 0.1 02/05/2022 1626    ASSESSMENT AND PLAN: 1. DM type 2 with diabetic mixed hyperlipidemia (HCC) A1c is improved and closer to goal.  Patient wants to continue current dose of metformin and Amaryl without increasing dose or adding additional medication.  She feels she can get her A1c below 7 by next visit.  Encouraged her to continue healthy eating habits and regular exercise - POCT glycosylated hemoglobin (Hb A1C) - POCT glucose (manual entry) - Microalbumin / creatinine urine ratio  2. Diabetes mellitus treated with oral medication (HCC)  3. Hypertension associated with type 2 diabetes mellitus (HCC) At goal.  Continue Cozaar 100 mg daily and amlodipine 5 mg daily  4. Left  foot pain - Ambulatory referral to Podiatry  5. Screening for colon cancer - Fecal occult blood, imunochemical([facility])  6. Need for immunization against influenza Given today  7. Need for Tdap vaccination Given today     Patient was given the opportunity to ask questions.  Patient verbalized understanding of the plan and was able to repeat key elements of the plan.   This documentation was completed using Paediatric nurse.  Any transcriptional errors are unintentional.  Orders Placed This Encounter  Procedures   POCT glycosylated hemoglobin (Hb A1C)   POCT glucose (manual entry)     Requested Prescriptions    No prescriptions requested or ordered in  this encounter    No follow-ups on file.  Jonah Blue, MD, FACP

## 2023-07-16 ENCOUNTER — Other Ambulatory Visit: Payer: Self-pay

## 2023-07-17 LAB — MICROALBUMIN / CREATININE URINE RATIO
Creatinine, Urine: 13.3 mg/dL
Microalb/Creat Ratio: <23 mg/g{creat} (ref 0–29)
Microalbumin, Urine: <3 ug/mL

## 2023-07-17 LAB — FECAL OCCULT BLOOD, IMMUNOCHEMICAL: Fecal Occult Bld: NEGATIVE

## 2023-07-26 ENCOUNTER — Ambulatory Visit (INDEPENDENT_AMBULATORY_CARE_PROVIDER_SITE_OTHER): Payer: No Typology Code available for payment source | Admitting: Podiatry

## 2023-07-26 ENCOUNTER — Other Ambulatory Visit: Payer: Self-pay

## 2023-07-26 ENCOUNTER — Ambulatory Visit (INDEPENDENT_AMBULATORY_CARE_PROVIDER_SITE_OTHER): Payer: No Typology Code available for payment source

## 2023-07-26 DIAGNOSIS — M79672 Pain in left foot: Secondary | ICD-10-CM | POA: Diagnosis not present

## 2023-07-26 DIAGNOSIS — G5762 Lesion of plantar nerve, left lower limb: Secondary | ICD-10-CM | POA: Diagnosis not present

## 2023-07-26 MED ORDER — METHYLPREDNISOLONE 4 MG PO TBPK
ORAL_TABLET | ORAL | 0 refills | Status: DC
Start: 2023-07-26 — End: 2023-11-19
  Filled 2023-07-26: qty 21, 6d supply, fill #0

## 2023-07-26 NOTE — Progress Notes (Signed)
  Subjective:  Patient ID: Connie Russell, female    DOB: [birth year],   MRN: [id number]  No chief complaint on file.   52 y.o. female presents for concern for ball of the foot pain that has been ongoing for about a week. Relates she was wearing some heels and the pain started and has not stopped since. She has tried salt water soaks and vicks capo rub but is not improving.  Relates burning and tingling in their feet. Patient is diabetic and last A1c was  Lab Results  Component Value Date   HGBA1C 7.4 (A) 07/15/2023   .   PCP:  Marcine Matar, MD     . Denies any other pedal complaints. Denies n/v/f/c.   Past Medical History:  Diagnosis Date   Diabetes (HCC)    Diabetes mellitus without complication (HCC)    Gastritis    GERD (gastroesophageal reflux disease)    Hyperlipidemia    Hypertension     Objective:  Physical Exam: Vascular: DP/PT pulses 2/4 bilateral. CFT <3 seconds. Normal hair growth on digits. No edema.  Skin. No lacerations or abrasions bilateral feet.  Musculoskeletal: MMT 5/5 bilateral lower extremities in DF, PF, Inversion and Eversion. Deceased ROM in DF of ankle joint. Tender to third and some fourth interspace pain. Some pain with metatarsal squeeze and mild palpable mulder's click . Neurological: Sensation intact to light touch.   Assessment:   1. Morton's metatarsalgia, neuralgia, or neuroma, left      Plan:  Patient was evaluated and treated and all questions answered. Discussed neuroma and treatment options with patient.  Radiographs reviewed and discussed with patient. No acute fractures or dislocations.  Injection offered today. Patient deferred  Discussed padding and offloading today.  Prescription for medrol dose pack provided.  Discussed if pain does not improve may consider  MRI for further surgical planning.  Patient to return in 6 weeks or sooner if concerns arise.     Louann Sjogren, DPM

## 2023-07-27 ENCOUNTER — Other Ambulatory Visit: Payer: Self-pay

## 2023-07-27 ENCOUNTER — Other Ambulatory Visit: Payer: Self-pay | Admitting: Internal Medicine

## 2023-07-27 DIAGNOSIS — E1159 Type 2 diabetes mellitus with other circulatory complications: Secondary | ICD-10-CM

## 2023-07-27 MED ORDER — AMLODIPINE BESYLATE 5 MG PO TABS
5.0000 mg | ORAL_TABLET | Freq: Every day | ORAL | 1 refills | Status: DC
  Filled 2023-07-27: qty 90, 90d supply, fill #0
  Filled 2023-10-20: qty 90, 90d supply, fill #1

## 2023-07-28 ENCOUNTER — Other Ambulatory Visit: Payer: Self-pay

## 2023-08-16 ENCOUNTER — Other Ambulatory Visit: Payer: Self-pay

## 2023-08-17 ENCOUNTER — Ambulatory Visit: Payer: Self-pay | Admitting: *Deleted

## 2023-08-17 ENCOUNTER — Other Ambulatory Visit (HOSPITAL_BASED_OUTPATIENT_CLINIC_OR_DEPARTMENT_OTHER): Payer: Self-pay

## 2023-08-17 NOTE — Telephone Encounter (Signed)
 Interpreter Andres ID # [phone] Chief Complaint: nausea, after taking metformin  1000 mg 2 times daily.  Symptoms: nausea after taking metformin  and patient reports she does eat and takes with meals. C/o dizziness room spinning at times, urge to vomit but does not. Feels like she has lost weight but did not report how many lbs lost.  Frequency: Jul 25, 2023 Pertinent Negatives: Patient denies other sx Disposition: [] ED /[] Urgent Care (no appt availability in office) / [] Appointment(In office/virtual)/ []  [place] [facility]/ [] Home Care/ [] Refused Recommended Disposition /[] Ong Mobile Bus/ [x]  Follow-up with PCP Additional Notes:   Please advise if appt needed. Please advise if another medication can be prescribed instead of metformin  or any recommendations how to tolerate medication. Please advise. Patient is still trying to take medication.    Summary: nausea   Pt stated she has been experiencing nausea and vomiting since December. Stated has not vomited recently but believes it may be due to her medication metFORMIN  (GLUCOPHAGE ) 1000 MG tablet.  Seeking clinical advice. Please call pt with Spanish Interpreter.               Called patient via interpreter Neville ID # [phone] to review sx of N/V and possible issues with metformin . During introduction, call was disconnected.  Reason for Disposition  Taking prescription medication that could cause nausea (e.g., narcotics/opiates, antibiotics, OCPs, many others)  Answer Assessment - Initial Assessment Questions 1. NAUSEA SEVERITY: How bad is the nausea? (e.g., mild, moderate, severe; dehydration, weight loss)   - MILD: loss of appetite without change in eating habits   - MODERATE: decreased oral intake without significant weight loss, dehydration, or malnutrition   - SEVERE: inadequate caloric or fluid intake, significant weight loss, symptoms of dehydration     Nausea after taking medication taking medication metformin  1000  mg 2. ONSET: When did the nausea begin?     Dec. 15  3. VOMITING: Any vomiting? If Yes, ask: How many times today?     na 4. RECURRENT SYMPTOM: Have you had nausea before? If Yes, ask: When was the last time? What happened that time?     Na  5. CAUSE: What do you think is causing the nausea?     Medication metformin . Reports dizziness room spinning, urge to vomit 6. PREGNANCY: Is there any chance you are pregnant? (e.g., unprotected intercourse, missed birth control pill, broken condom)     na  Protocols used: Nausea-A-AH

## 2023-08-17 NOTE — Telephone Encounter (Signed)
 Call placed with  interpreter # [phone].   Call placed to patient unable to reach message left on VM.

## 2023-08-18 NOTE — Telephone Encounter (Signed)
 Spoke with patient Engineer, technical sales # [phone]) patient reports she continues to having nausea and vomit after taking metformin. Patient voiced she is eating with medications as directed. Appointment for 08/18/2022

## 2023-08-19 ENCOUNTER — Ambulatory Visit: Payer: No Typology Code available for payment source | Attending: Internal Medicine | Admitting: Internal Medicine

## 2023-08-19 ENCOUNTER — Other Ambulatory Visit: Payer: Self-pay

## 2023-08-19 ENCOUNTER — Encounter: Payer: Self-pay | Admitting: Internal Medicine

## 2023-08-19 VITALS — BP 125/76 | HR 86 | Temp 97.8°F | Ht 62.0 in | Wt 144.0 lb

## 2023-08-19 DIAGNOSIS — R42 Dizziness and giddiness: Secondary | ICD-10-CM

## 2023-08-19 MED ORDER — MECLIZINE HCL 12.5 MG PO TABS
12.5000 mg | ORAL_TABLET | Freq: Every day | ORAL | 0 refills | Status: DC
  Filled 2023-08-19: qty 15, 15d supply, fill #0

## 2023-08-19 NOTE — Progress Notes (Signed)
 Patient ID: Connie Russell, female    DOB: [birth year]  MRN: [id number]  CC: Dizziness (Dizziness, nausea, dizziness episode on 07/25/23 for a few days & 08/13/2023 /Trouble standing at work due to dizziness /)   Subjective: Connie Russell is a 53 y.o. female who presents for UC visit Her concerns today include:  Patient with history of diabetes type 2, HTN, HL, constipation, vitamin D  deficiency.   [employer] Language interpreter used during this encounter. #[id number], [name]  Discussed the use of AI scribe software for clinical note transcription with the patient, who gave verbal consent to proceed.  History of Present Illness   The patient presents with recurrent episodes of dizziness and vomiting. The first episode began on December 15th and lasted for a few days. The symptoms recurred on January 1st, with dizziness and vomiting persisting throughout the day and night. The vomiting resolved within a day, but the dizziness continued.  Felt dizzy when she sits down and when she gets up from a seated position.  The patient reports feeling better today, with no dizziness upon waking. She denies any specific triggers for the symptoms, such as certain foods or activities. She also denies any associated diarrhea.  She has not had any associated upper respiratory symptoms.  No ringing in the ears.  The patient has been maintaining hydration with water  and Gatorade.       Patient Active Problem List   Diagnosis Date Noted   Hyperlipidemia associated with type 2 diabetes mellitus (HCC) 11/10/2022   Wart on thumb 05/14/2022   Umbilical hernia without obstruction and without gangrene    Benign paroxysmal positional vertigo 06/04/2017   Diabetes mellitus type 2, controlled (HCC) 03/18/2016   Gastritis 09/16/2015   Dyspepsia 06/13/2015   Constipation 01/15/2014   GERD (gastroesophageal reflux disease) 01/15/2014   Hypertension, essential 08/14/2013   Other and unspecified hyperlipidemia  08/14/2013   Preventative health care 07/14/2013     Current Outpatient Medications on File Prior to Visit  Medication Sig Dispense Refill   amLODipine  (NORVASC ) 5 MG tablet Take 1 tablet (5 mg total) by mouth daily. 90 tablet 1   Ascorbic Acid (VITAMIN C) 100 MG tablet Take 100 mg by mouth daily.     aspirin  EC 81 MG tablet Take 1 tablet (81 mg total) by mouth daily. 30 tablet 11   atorvastatin  (LIPITOR) 10 MG tablet Take 1 tablet (10 mg total) by mouth daily. 90 tablet 1   Blood Glucose Monitoring Suppl (TRUE METRIX METER) w/Device KIT Use as directed 1 kit 0   cyanocobalamin (VITAMIN B12) 500 MCG tablet Take 500 mcg by mouth daily.     glimepiride  (AMARYL ) 2 MG tablet Take 2 tablets (4 mg total) by mouth daily before breakfast. 180 tablet 1   glucose blood (TRUE METRIX BLOOD GLUCOSE TEST) test strip Use as instructed 100 each 1   losartan  (COZAAR ) 100 MG tablet Take 1 tablet (100mg ) by mouth once daily. 90 tablet 1   magnesium gluconate (MAGONATE) 500 MG tablet Take 500 mg by mouth daily.     metFORMIN  (GLUCOPHAGE ) 1000 MG tablet Take 1 tablet (1,000 mg total) by mouth 2 (two) times daily with a meal. 180 tablet 1   Moringa Oleifera (MORINGA PO) Take 1 tablet by mouth daily.     omeprazole  (PRILOSEC) 20 MG capsule Take 1 capsule (20 mg total) by mouth daily. 90 capsule 0   TRUEplus Lancets 28G MISC Use as directed 100 each 6   methylPREDNISolone  (  MEDROL  DOSEPAK) 4 MG TBPK tablet Take as directed (Patient not taking: Reported on 08/19/2023) 21 tablet 0   Vitamin D , Cholecalciferol , 400 units CAPS Take 400 Units by mouth every morning. (Patient not taking: Reported on 08/19/2023) 90 capsule 0   No current facility-administered medications on file prior to visit.    Allergies  Allergen Reactions   Linzess  [Linaclotide ] Diarrhea    DIARRHEA AT 145 MICROG    Social History   Socioeconomic History   Marital status: Single    Spouse name: Not on file   Number of children: Not on file    Years of education: Not on file   Highest education level: Not on file  Occupational History   Not on file  Tobacco Use   Smoking status: Never   Smokeless tobacco: Never   Tobacco comments:    Never smoked  Vaping Use   Vaping status: Never Used  Substance and Sexual Activity   Alcohol use: No    Alcohol/week: 0.0 standard drinks of alcohol   Drug use: No   Sexual activity: Yes    Birth control/protection: Pill, None  Other Topics Concern   Not on file  Social History Narrative   ** Merged History Encounter **       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   Social Drivers of Health   Financial Resource Strain: High Risk (07/15/2023)   Overall Financial Resource Strain (CARDIA)    Difficulty of Paying Living Expenses: Hard  Food Insecurity: Food Insecurity Present (07/15/2023)   Hunger Vital Sign    Worried About Running Out of Food in the Last Year: Never true    Ran Out of Food in the Last Year: Sometimes true  Transportation Needs: No Transportation Needs (07/15/2023)   PRAPARE - Administrator, Civil Service (Medical): No    Lack of Transportation (Non-Medical): No  Physical Activity: Insufficiently Active (07/15/2023)   Exercise Vital Sign    Days of Exercise per Week: 3 days    Minutes of Exercise per Session: 30 min  Stress: No Stress Concern Present (07/15/2023)   [employer] of Occupational Health - Occupational Stress Questionnaire    Feeling of Stress : Not at all  Social Connections: Moderately Integrated (07/15/2023)   Social Connection and Isolation Panel [NHANES]    Frequency of Communication with Friends and Family: Once a week    Frequency of Social Gatherings with Friends and Family: Once a week    Attends Religious Services: More than 4 times per year    Active Member of [employer] or Organizations: No    Attends Engineer, Structural: More than 4 times per year    Marital Status: Married  Catering Manager Violence: Not At Risk (07/15/2023)    Humiliation, Afraid, Rape, and Kick questionnaire    Fear of Current or Ex-Partner: No    Emotionally Abused: No    Physically Abused: No    Sexually Abused: No    Family History  Problem Relation Age of Onset   Hypertension Mother    Arthritis Mother    Diabetes Maternal Grandmother    Colon cancer Neg Hx     Past Surgical History:  Procedure Laterality Date   ESOPHAGOGASTRODUODENOSCOPY N/A 06/24/2015   SLF: 1. Abdominal pain most likely due to constipation, GERD, and Gastritis. 2. MIld NON-erosive gastritis.   FOOT SURGERY Left 2000   Local   PLANTAR'S WART EXCISION     UMBILICAL HERNIA REPAIR N/A 04/17/2020  Procedure: HERNIA REPAIR UMBILICAL ADULT, Open WITH MESH;  Surgeon: Desiderio Schanz, MD;  Location: [facility] ORS;  Service: General;  Laterality: N/A;    ROS: Review of Systems Negative except as stated above  PHYSICAL EXAM: BP 125/76 (BP Location: Left Arm, Patient Position: Sitting, Cuff Size: Normal)   Pulse 86   Temp 97.8 F (36.6 C) (Oral)   Ht 5' 2 (1.575 m)   Wt 144 lb (65.3 kg)   SpO2 100%   BMI 26.34 kg/m   Physical Exam Sitting: BP 124/79, P88 Standing: BP 127/83, P88 General appearance - alert, well appearing, and in no distress Mental status - normal mood, behavior, speech, dress, motor activity, and thought processes Eyes -Pink conjunctiva.   Mouth -oral mucosa is moist. Neck - supple, no significant adenopathy Chest - clear to auscultation, no wheezes, rales or rhonchi, symmetric air entry Heart - normal rate, regular rhythm, normal S1, S2, no murmurs, rubs, clicks or gallops Neurological - cranial nerves II through XII intact, motor and sensory grossly normal bilaterally, Romberg sign negative, normal gait and station      Latest Ref Rng & Units 03/23/2023   10:32 AM 12/22/2022    4:51 PM 02/05/2022    4:26 PM  CMP  Glucose 70 - 99 mg/dL 800  831  779   BUN 6 - 24 mg/dL 13  14  10    Creatinine 0.57 - 1.00 mg/dL 9.30  9.41  9.34   Sodium 134 -  144 mmol/L 135  138  137   Potassium 3.5 - 5.2 mmol/L 4.6  4.1  4.1   Chloride 96 - 106 mmol/L 98  99  100   CO2 20 - 29 mmol/L 25  23  22    Calcium  8.7 - 10.2 mg/dL 9.4  89.8  9.7   Total Protein 6.0 - 8.5 g/dL 6.8   7.2   Total Bilirubin 0.0 - 1.2 mg/dL 0.5   0.3   Alkaline Phos 44 - 121 IU/L 72   82   AST 0 - 40 IU/L 16   19   ALT 0 - 32 IU/L 20   24    Lipid Panel     Component Value Date/Time   CHOL 144 03/23/2023 1032   TRIG 233 (H) 03/23/2023 1032   HDL 47 03/23/2023 1032   CHOLHDL 3.1 03/23/2023 1032   CHOLHDL 4.3 09/09/2015 0942   VLDL 23 09/09/2015 0942   LDLCALC 60 03/23/2023 1032    CBC    Component Value Date/Time   WBC 7.2 03/23/2023 1032   WBC 5.8 03/04/2017 1244   RBC 4.52 03/23/2023 1032   RBC 4.40 03/04/2017 1244   HGB 14.2 03/23/2023 1032   HCT 40.4 03/23/2023 1032   PLT 333 03/23/2023 1032   MCV 89 03/23/2023 1032   MCH 31.4 03/23/2023 1032   MCH 30.9 03/04/2017 1244   MCHC 35.1 03/23/2023 1032   MCHC 35.3 03/04/2017 1244   RDW 11.7 03/23/2023 1032   LYMPHSABS 2.6 02/05/2022 1626   MONOABS 0.2 03/04/2017 1244   EOSABS 0.2 02/05/2022 1626   BASOSABS 0.1 02/05/2022 1626    ASSESSMENT AND PLAN:  1. Dizziness (Primary) Could have been due to dehydration versus acute labyrinthitis.  Neurologic exam today is nonfocal and orthostatic check with blood pressure and pulse negative.. Advised to stay hydrated by drinking 4 to 8 glasses of water  daily. Trial of meclizine  as needed if dizziness returns. Go slow with position changes. - meclizine  (ANTIVERT ) 12.5 MG  tablet; Take 1 tablet (12.5 mg total) by mouth daily.  Dispense: 15 tablet; Refill: 0 - CBC       Patient was given the opportunity to ask questions.  Patient verbalized understanding of the plan and was able to repeat key elements of the plan.   This documentation was completed using Paediatric nurse.  Any transcriptional errors are unintentional.  Orders Placed This  Encounter  Procedures   CBC     Requested Prescriptions   Signed Prescriptions Disp Refills   meclizine  (ANTIVERT ) 12.5 MG tablet 15 tablet 0    Sig: Take 1 tablet (12.5 mg total) by mouth daily.    No follow-ups on file.  Barnie Louder, MD, FACP

## 2023-08-20 LAB — CBC
Hematocrit: 42.4 % (ref 34.0–46.6)
Hemoglobin: 14.4 g/dL (ref 11.1–15.9)
MCH: 30.6 pg (ref 26.6–33.0)
MCHC: 34 g/dL (ref 31.5–35.7)
MCV: 90 fL (ref 79–97)
Platelets: 312 10*3/uL (ref 150–450)
RBC: 4.71 x10E6/uL (ref 3.77–5.28)
RDW: 11.8 % (ref 11.7–15.4)
WBC: 6.4 10*3/uL (ref 3.4–10.8)

## 2023-08-24 ENCOUNTER — Other Ambulatory Visit: Payer: Self-pay

## 2023-09-06 ENCOUNTER — Ambulatory Visit (INDEPENDENT_AMBULATORY_CARE_PROVIDER_SITE_OTHER): Payer: No Typology Code available for payment source | Admitting: Podiatry

## 2023-09-06 DIAGNOSIS — G5762 Lesion of plantar nerve, left lower limb: Secondary | ICD-10-CM

## 2023-09-06 MED ORDER — DEXAMETHASONE SODIUM PHOSPHATE 120 MG/30ML IJ SOLN
4.0000 mg | Freq: Once | INTRAMUSCULAR | Status: AC
  Administered 2023-09-06: 4 mg via INTRA_ARTICULAR

## 2023-09-06 MED ORDER — TRIAMCINOLONE ACETONIDE 10 MG/ML IJ SUSP
2.5000 mg | Freq: Once | INTRAMUSCULAR | Status: AC
  Administered 2023-09-06: 2.5 mg via INTRA_ARTICULAR

## 2023-09-06 NOTE — Progress Notes (Signed)
  Subjective:  Patient ID: Connie Russell, female    DOB: [birth year],   MRN: [id number]  No chief complaint on file.   53 y.o. female presents for follow-up of left neuorma/metatarsalgia. Relates no improvement in her pain. Pads made the pain worse and no improvement on medrol dose pack. Here with interpreter.   Relates burning and tingling in their feet. Patient is diabetic and last A1c was  Lab Results  Component Value Date   HGBA1C 7.4 (A) 07/15/2023   .   PCP:  Marcine Matar, MD     . Denies any other pedal complaints. Denies n/v/f/c.   Past Medical History:  Diagnosis Date   Diabetes (HCC)    Diabetes mellitus without complication (HCC)    Gastritis    GERD (gastroesophageal reflux disease)    Hyperlipidemia    Hypertension     Objective:  Physical Exam: Vascular: DP/PT pulses 2/4 bilateral. CFT <3 seconds. Normal hair growth on digits. No edema.  Skin. No lacerations or abrasions bilateral feet.  Musculoskeletal: MMT 5/5 bilateral lower extremities in DF, PF, Inversion and Eversion. Deceased ROM in DF of ankle joint. Tender to third and some fourth interspace pain Mostly tender to the second interspace today. . Some pain with metatarsal squeeze and mild palpable mulder's click . Neurological: Sensation intact to light touch.   Assessment:   1. Morton's metatarsalgia, neuralgia, or neuroma, left       Plan:  Patient was evaluated and treated and all questions answered. Discussed neuroma and treatment options with patient.  Radiographs reviewed and discussed with patient. No acute fractures or dislocations.  Injection offered today. Procedure below.  Discussed padding and offloading today.  Discussed if pain does not improve may consider  MRI for further surgical planning.  Patient to return in 6 weeks or sooner if concerns arise.   Procedure: Injection Tendon/Ligament Discussed alternatives, risks, complications and verbal consent was obtained.   Location: Left second interspace . Skin Prep: Alcohol. Injectate: 1cc 0.5% marcaine plain, 1 cc dexamethasone 0.5 cc kenalog  Disposition: Patient tolerated procedure well. Injection site dressed with a band-aid.  Post-injection care was discussed and return precautions discussed.    Louann Sjogren, DPM

## 2023-09-14 ENCOUNTER — Other Ambulatory Visit: Payer: Self-pay | Admitting: Internal Medicine

## 2023-09-14 DIAGNOSIS — K219 Gastro-esophageal reflux disease without esophagitis: Secondary | ICD-10-CM

## 2023-09-15 ENCOUNTER — Other Ambulatory Visit: Payer: Self-pay

## 2023-09-15 MED ORDER — OMEPRAZOLE 20 MG PO CPDR
20.0000 mg | DELAYED_RELEASE_CAPSULE | Freq: Every day | ORAL | 0 refills | Status: DC
  Filled 2023-09-15: qty 30, 30d supply, fill #0
  Filled 2023-10-20: qty 30, 30d supply, fill #1
  Filled 2023-11-19: qty 30, 30d supply, fill #2

## 2023-09-16 ENCOUNTER — Other Ambulatory Visit: Payer: Self-pay

## 2023-10-18 ENCOUNTER — Ambulatory Visit (INDEPENDENT_AMBULATORY_CARE_PROVIDER_SITE_OTHER): Payer: No Typology Code available for payment source | Admitting: Podiatry

## 2023-10-18 DIAGNOSIS — G5762 Lesion of plantar nerve, left lower limb: Secondary | ICD-10-CM | POA: Diagnosis not present

## 2023-10-18 NOTE — Progress Notes (Signed)
  Subjective:  Patient ID: Connie Russell, female    DOB: Nov 22, 1970,   MRN: [id number]  No chief complaint on file.   53 y.o. female presents for follow-up of left neuorma/metatarsalgia. Relates the injection did help and is about 80% better Here with interpreter.   Relates burning and tingling in their feet. Patient is diabetic and last A1c was  Lab Results  Component Value Date   HGBA1C 7.4 (A) 07/15/2023   .   PCP:  Marcine Matar, MD     . Denies any other pedal complaints. Denies n/v/f/c.   Past Medical History:  Diagnosis Date   Diabetes (HCC)    Diabetes mellitus without complication (HCC)    Gastritis    GERD (gastroesophageal reflux disease)    Hyperlipidemia    Hypertension     Objective:  Physical Exam: Vascular: DP/PT pulses 2/4 bilateral. CFT <3 seconds. Normal hair growth on digits. No edema.  Skin. No lacerations or abrasions bilateral feet.  Musculoskeletal: MMT 5/5 bilateral lower extremities in DF, PF, Inversion and Eversion. Deceased ROM in DF of ankle joint. Minimally tender to third and some fourth interspace pain Mostly tender to the second interspace today. . Some pain with metatarsal squeeze and mild palpable mulder's click . Neurological: Sensation intact to light touch.   Assessment:   1. Morton's metatarsalgia, neuralgia, or neuroma, left        Plan:  Patient was evaluated and treated and all questions answered. Discussed neuroma and treatment options with patient.  Radiographs reviewed and discussed with patient. No acute fractures or dislocations.  Continue padding.  Should continue to improve Discussed if pain does not improve may consider  MRI for further surgical planning.  Patient to return as needed     Louann Sjogren, DPM

## 2023-10-20 ENCOUNTER — Other Ambulatory Visit: Payer: Self-pay | Admitting: Internal Medicine

## 2023-10-20 DIAGNOSIS — E1169 Type 2 diabetes mellitus with other specified complication: Secondary | ICD-10-CM

## 2023-10-21 ENCOUNTER — Other Ambulatory Visit: Payer: Self-pay

## 2023-10-21 MED ORDER — GLIMEPIRIDE 2 MG PO TABS
4.0000 mg | ORAL_TABLET | Freq: Every day | ORAL | 0 refills | Status: DC
  Filled 2023-10-21 – 2023-11-01 (×3): qty 180, 90d supply, fill #0

## 2023-10-21 NOTE — Telephone Encounter (Signed)
 Requested Prescriptions  Pending Prescriptions Disp Refills   glimepiride (AMARYL) 2 MG tablet 180 tablet 0    Sig: Take 2 tablets (4 mg total) by mouth daily before breakfast.     Endocrinology:  Diabetes - Sulfonylureas Passed - 10/21/2023 11:46 AM      Passed - HBA1C is between 0 and 7.9 and within 180 days    HbA1c, POC (controlled diabetic range)  Date Value Ref Range Status  07/15/2023 7.4 (A) 0.0 - 7.0 % Final         Passed - Cr in normal range and within 360 days    Creat  Date Value Ref Range Status  03/18/2016 0.61 0.50 - 1.10 mg/dL Final   Creatinine, Ser  Date Value Ref Range Status  03/23/2023 0.69 0.57 - 1.00 mg/dL Final         Passed - Valid encounter within last 6 months    Recent Outpatient Visits           2 months ago Dizziness   [facility] Of [place]. [facility] Jonah Blue B, MD   3 months ago DM type 2 with diabetic mixed hyperlipidemia [facility])   [facility] Of [place]. [facility] Jonah Blue B, MD   7 months ago DM type 2 with diabetic mixed hyperlipidemia [facility])   [facility] - A Dept Of [place]. [facility] Jonah Blue B, MD   11 months ago Type 2 diabetes mellitus without complication, without long-term current use of insulin (HCC)   [facility] Dept Of [place]. [facility] Marcine Matar, MD   1 year ago Pap smear for cervical cancer screening   [facility] - A Dept Of [place]. [facility] Marcine Matar, MD       Future Appointments             In 4 weeks Marcine Matar, MD [facility] [place] - A Dept Of [place]a [facility]. [facility]

## 2023-10-22 ENCOUNTER — Other Ambulatory Visit: Payer: Self-pay

## 2023-10-29 ENCOUNTER — Other Ambulatory Visit: Payer: Self-pay

## 2023-11-01 ENCOUNTER — Other Ambulatory Visit: Payer: Self-pay

## 2023-11-19 ENCOUNTER — Other Ambulatory Visit: Payer: Self-pay

## 2023-11-19 ENCOUNTER — Ambulatory Visit: Payer: No Typology Code available for payment source | Attending: Internal Medicine | Admitting: Internal Medicine

## 2023-11-19 ENCOUNTER — Encounter: Payer: Self-pay | Admitting: Internal Medicine

## 2023-11-19 VITALS — BP 122/77 | HR 84 | Temp 98.0°F | Ht 62.0 in | Wt 145.0 lb

## 2023-11-19 DIAGNOSIS — E1169 Type 2 diabetes mellitus with other specified complication: Secondary | ICD-10-CM

## 2023-11-19 DIAGNOSIS — Z7984 Long term (current) use of oral hypoglycemic drugs: Secondary | ICD-10-CM | POA: Diagnosis not present

## 2023-11-19 DIAGNOSIS — B079 Viral wart, unspecified: Secondary | ICD-10-CM

## 2023-11-19 DIAGNOSIS — I10 Essential (primary) hypertension: Secondary | ICD-10-CM | POA: Diagnosis not present

## 2023-11-19 DIAGNOSIS — E782 Mixed hyperlipidemia: Secondary | ICD-10-CM | POA: Diagnosis not present

## 2023-11-19 DIAGNOSIS — L989 Disorder of the skin and subcutaneous tissue, unspecified: Secondary | ICD-10-CM

## 2023-11-19 DIAGNOSIS — K219 Gastro-esophageal reflux disease without esophagitis: Secondary | ICD-10-CM

## 2023-11-19 DIAGNOSIS — E119 Type 2 diabetes mellitus without complications: Secondary | ICD-10-CM | POA: Diagnosis not present

## 2023-11-19 DIAGNOSIS — E1159 Type 2 diabetes mellitus with other circulatory complications: Secondary | ICD-10-CM

## 2023-11-19 LAB — POCT GLYCOSYLATED HEMOGLOBIN (HGB A1C): HbA1c, POC (controlled diabetic range): 6.7 % (ref 0.0–7.0)

## 2023-11-19 LAB — GLUCOSE, POCT (MANUAL RESULT ENTRY): POC Glucose: 77 mg/dL (ref 70–99)

## 2023-11-19 MED ORDER — ATORVASTATIN CALCIUM 10 MG PO TABS
10.0000 mg | ORAL_TABLET | Freq: Every day | ORAL | 1 refills | Status: DC
  Filled 2023-11-19 – 2023-12-15 (×2): qty 90, 90d supply, fill #0
  Filled 2024-03-15: qty 90, 90d supply, fill #1

## 2023-11-19 MED ORDER — OMEPRAZOLE 20 MG PO CPDR
20.0000 mg | DELAYED_RELEASE_CAPSULE | Freq: Every day | ORAL | 1 refills | Status: DC
  Filled 2023-12-15: qty 30, 30d supply, fill #0
  Filled 2024-01-20: qty 30, 30d supply, fill #1
  Filled 2024-02-21: qty 30, 30d supply, fill #2
  Filled 2024-03-23: qty 30, 30d supply, fill #3
  Filled 2024-04-27: qty 30, 30d supply, fill #4
  Filled 2024-05-25: qty 30, 30d supply, fill #5

## 2023-11-19 MED ORDER — GLIMEPIRIDE 2 MG PO TABS
4.0000 mg | ORAL_TABLET | Freq: Every day | ORAL | 1 refills | Status: DC
Start: 2023-11-19 — End: 2024-03-31
  Filled 2023-11-19 – 2024-01-20 (×2): qty 180, 90d supply, fill #0
  Filled 2024-01-25: qty 60, 30d supply, fill #0

## 2023-11-19 MED ORDER — LOSARTAN POTASSIUM 100 MG PO TABS
100.0000 mg | ORAL_TABLET | Freq: Every day | ORAL | 1 refills | Status: DC
  Filled 2023-11-19 – 2023-12-15 (×2): qty 90, 90d supply, fill #0
  Filled 2024-03-15: qty 90, 90d supply, fill #1

## 2023-11-19 MED ORDER — METFORMIN HCL 1000 MG PO TABS
1000.0000 mg | ORAL_TABLET | Freq: Two times a day (BID) | ORAL | 1 refills | Status: DC
  Filled 2023-11-19 – 2023-12-15 (×3): qty 180, 90d supply, fill #0
  Filled 2024-03-15: qty 180, 90d supply, fill #1

## 2023-11-19 MED ORDER — AMLODIPINE BESYLATE 5 MG PO TABS
5.0000 mg | ORAL_TABLET | Freq: Every day | ORAL | 1 refills | Status: DC
  Filled 2023-11-19 – 2024-01-20 (×2): qty 90, 90d supply, fill #0

## 2023-11-19 NOTE — Progress Notes (Signed)
 Patient ID: Connie Russell, female    DOB: 08-07-1971  MRN: [id number]  CC: Diabetes (DM f/u. Med refill. /Wart on thumb on R hand X2 yrs )   Subjective: Connie Russell is a 53 y.o. female who presents for chronic ds management. Her concerns today include:  Patient with history of diabetes type 2, HTN, HL, constipation, vitamin D deficiency.    [employer] Language interpreter used during this encounter. #Connie Russell [phone]  Discussed the use of AI scribe software for clinical note transcription with the patient, who gave verbal consent to proceed.  History of Present Illness   The patient, with a history of diabetes and hypertension, presents for a routine follow-up.   DM: Results for orders placed or performed in visit on 11/19/23  POCT glucose (manual entry)   Collection Time: 11/19/23  4:14 PM  Result Value Ref Range   POC Glucose 77 70 - 99 mg/dl  POCT glycosylated hemoglobin (Hb A1C)   Collection Time: 11/19/23  4:21 PM  Result Value Ref Range   Hemoglobin A1C     HbA1c POC (<> result, manual entry)     HbA1c, POC (prediabetic range)     HbA1c, POC (controlled diabetic range) 6.7 0.0 - 7.0 %   Her most recent A1c is 6.7, which is an improvement from 7.4 four months ago. She confirms adherence to her current regimen of metformin 1000mg  twice daily and glimepiride 4mg  once daily. She checks her blood sugars three to four times a week, with readings ranging from 105 to 145. She reports good dietary habits and daily walking for exercise. Last eye exam was done in June of last year at [facility].  HTN:  Her blood pressure at this visit is 122/77, and she confirms adherence to her current regimen of losartan and amlodipine. She also takes atorvastatin for cholesterol management.  The patient has a wart on herRT  thumb, which has been previously treated with cauterization twice last year but has recurred. She has tried over-the-counter wart compounds, but these have caused  irritation. She also reports having had a wart on her RT big toe, which was surgically removed and has not recurred.  She also reports the recent appearance of dark spots on her skin, mainly on her legs, which have been present for approximately six months. She denies any associated itching or pain.       Patient Active Problem List   Diagnosis Date Noted   Hyperlipidemia associated with type 2 diabetes mellitus (HCC) 11/10/2022   Wart on thumb 05/14/2022   Umbilical hernia without obstruction and without gangrene    Benign paroxysmal positional vertigo 06/04/2017   Diabetes mellitus type 2, controlled (HCC) 03/18/2016   Gastritis 09/16/2015   Dyspepsia 06/13/2015   Constipation 01/15/2014   GERD (gastroesophageal reflux disease) 01/15/2014   Hypertension, essential 08/14/2013   Other and unspecified hyperlipidemia 08/14/2013   Preventative health care 07/14/2013     Current Outpatient Medications on File Prior to Visit  Medication Sig Dispense Refill   Ascorbic Acid (VITAMIN C) 100 MG tablet Take 100 mg by mouth daily.     aspirin EC 81 MG tablet Take 1 tablet (81 mg total) by mouth daily. 30 tablet 11   Blood Glucose Monitoring Suppl (TRUE METRIX METER) w/Device KIT Use as directed 1 kit 0   cyanocobalamin (VITAMIN B12) 500 MCG tablet Take 500 mcg by mouth daily.     glucose blood (TRUE METRIX BLOOD GLUCOSE TEST) test strip Use  as instructed 100 each 1   magnesium gluconate (MAGONATE) 500 MG tablet Take 500 mg by mouth daily.     meclizine (ANTIVERT) 12.5 MG tablet Take 1 tablet (12.5 mg total) by mouth daily. 15 tablet 0   Moringa Oleifera (MORINGA PO) Take 1 tablet by mouth daily.     TRUEplus Lancets 28G MISC Use as directed 100 each 6   Vitamin D, Cholecalciferol, 400 units CAPS Take 400 Units by mouth every morning. 90 capsule 0   No current facility-administered medications on file prior to visit.    Allergies  Allergen Reactions   Linzess [Linaclotide] Diarrhea     DIARRHEA AT 145 MICROG    Social History   Socioeconomic History   Marital status: Single    Spouse name: Not on file   Number of children: Not on file   Years of education: Not on file   Highest education level: Not on file  Occupational History   Not on file  Tobacco Use   Smoking status: Never   Smokeless tobacco: Never   Tobacco comments:    Never smoked  Vaping Use   Vaping status: Never Used  Substance and Sexual Activity   Alcohol use: No    Alcohol/week: 0.0 standard drinks of alcohol   Drug use: No   Sexual activity: Yes    Birth control/protection: Pill, None  Other Topics Concern   Not on file  Social History Narrative   ** Merged History Encounter **       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   Social Drivers of Health   Financial Resource Strain: High Risk (07/15/2023)   Overall Financial Resource Strain (CARDIA)    Difficulty of Paying Living Expenses: Hard  Food Insecurity: Food Insecurity Present (07/15/2023)   Hunger Vital Sign    Worried About Running Out of Food in the Last Year: Never true    Ran Out of Food in the Last Year: Sometimes true  Transportation Needs: No Transportation Needs (07/15/2023)   PRAPARE - Administrator, Civil Service (Medical): No    Lack of Transportation (Non-Medical): No  Physical Activity: Insufficiently Active (07/15/2023)   Exercise Vital Sign    Days of Exercise per Week: 3 days    Minutes of Exercise per Session: 30 min  Stress: No Stress Concern Present (07/15/2023)   [employer] of Occupational Health - Occupational Stress Questionnaire    Feeling of Stress : Not at all  Social Connections: Moderately Integrated (07/15/2023)   Social Connection and Isolation Panel [NHANES]    Frequency of Communication with Friends and Family: Once a week    Frequency of Social Gatherings with Friends and Family: Once a week    Attends Religious Services: More than 4 times per year    Active Member of Clubs or  Organizations: No    Attends Engineer, structural: More than 4 times per year    Marital Status: Married  Catering manager Violence: Not At Risk (07/15/2023)   Humiliation, Afraid, Rape, and Kick questionnaire    Fear of Current or Ex-Partner: No    Emotionally Abused: No    Physically Abused: No    Sexually Abused: No    Family History  Problem Relation Age of Onset   Hypertension Mother    Arthritis Mother    Diabetes Maternal Grandmother    Colon cancer Neg Hx     Past Surgical History:  Procedure Laterality Date   ESOPHAGOGASTRODUODENOSCOPY N/A 06/24/2015  SLF: 1. Abdominal pain most likely due to constipation, GERD, and Gastritis. 2. MIld NON-erosive gastritis.   FOOT SURGERY Left 2000   Local   PLANTAR'S WART EXCISION     UMBILICAL HERNIA REPAIR N/A 04/17/2020   Procedure: HERNIA REPAIR UMBILICAL ADULT, Open WITH MESH;  Surgeon: Henrene Dodge, MD;  Location: [facility] ORS;  Service: General;  Laterality: N/A;    ROS: Review of Systems Negative except as stated above  PHYSICAL EXAM: BP 122/77 (BP Location: Left Arm, Patient Position: Sitting, Cuff Size: Normal)   Pulse 84   Temp 98 F (36.7 C) (Oral)   Ht 5\' 2"  (1.575 m)   Wt 145 lb (65.8 kg)   SpO2 96%   BMI 26.52 kg/m   Wt Readings from Last 3 Encounters:  11/19/23 145 lb (65.8 kg)  08/19/23 144 lb (65.3 kg)  07/15/23 144 lb (65.3 kg)    Physical Exam  General appearance - alert, well appearing, and in no distress Mental status - normal mood, behavior, speech, dress, motor activity, and thought processes Chest - clear to auscultation, no wheezes, rales or rhonchi, symmetric air entry Heart - normal rate, regular rhythm, normal S1, S2, no murmurs, rubs, clicks or gallops Extremities - peripheral pulses normal, no pedal edema, no clubbing or cyanosis Skin -she has a 0.5 cm wart on the palmar surface of the right thumb.  She has two small circular slightly raised hyperpigmented spots on both lower  shin      Latest Ref Rng & Units 03/23/2023   10:32 AM 12/22/2022    4:51 PM 02/05/2022    4:26 PM  CMP  Glucose 70 - 99 mg/dL 478  295  621   BUN 6 - 24 mg/dL 13  14  10    Creatinine 0.57 - 1.00 mg/dL 3.08  6.57  8.46   Sodium 134 - 144 mmol/L 135  138  137   Potassium 3.5 - 5.2 mmol/L 4.6  4.1  4.1   Chloride 96 - 106 mmol/L 98  99  100   CO2 20 - 29 mmol/L 25  23  22    Calcium 8.7 - 10.2 mg/dL 9.4  96.2  9.7   Total Protein 6.0 - 8.5 g/dL 6.8   7.2   Total Bilirubin 0.0 - 1.2 mg/dL 0.5   0.3   Alkaline Phos 44 - 121 IU/L 72   82   AST 0 - 40 IU/L 16   19   ALT 0 - 32 IU/L 20   24    Lipid Panel     Component Value Date/Time   CHOL 144 03/23/2023 1032   TRIG 233 (H) 03/23/2023 1032   HDL 47 03/23/2023 1032   CHOLHDL 3.1 03/23/2023 1032   CHOLHDL 4.3 09/09/2015 0942   VLDL 23 09/09/2015 0942   LDLCALC 60 03/23/2023 1032    CBC    Component Value Date/Time   WBC 6.4 08/19/2023 1213   WBC 5.8 03/04/2017 1244   RBC 4.71 08/19/2023 1213   RBC 4.40 03/04/2017 1244   HGB 14.4 08/19/2023 1213   HCT 42.4 08/19/2023 1213   PLT 312 08/19/2023 1213   MCV 90 08/19/2023 1213   MCH 30.6 08/19/2023 1213   MCH 30.9 03/04/2017 1244   MCHC 34.0 08/19/2023 1213   MCHC 35.3 03/04/2017 1244   RDW 11.8 08/19/2023 1213   LYMPHSABS 2.6 02/05/2022 1626   MONOABS 0.2 03/04/2017 1244   EOSABS 0.2 02/05/2022 1626   BASOSABS 0.1 02/05/2022 1626  ASSESSMENT AND PLAN: 1. Diabetes mellitus treated with oral medication (HCC) (Primary) At goal.  Continue metformin 1 g twice a day and Amaryl 4 mg daily.  Continue healthy eating habits and regular exercise. - POCT glycosylated hemoglobin (Hb A1C) - POCT glucose (manual entry)  2. DM type 2 with diabetic mixed hyperlipidemia (HCC) Continue atorvastatin 10 mg daily. - losartan (COZAAR) 100 MG tablet; Take 1 tablet (100mg ) by mouth once daily.  Dispense: 90 tablet; Refill: 1 - atorvastatin (LIPITOR) 10 MG tablet; Take 1 tablet (10 mg  total) by mouth daily.  Dispense: 90 tablet; Refill: 1 - glimepiride (AMARYL) 2 MG tablet; Take 2 tablets (4 mg total) by mouth daily before breakfast.  Dispense: 180 tablet; Refill: 1 - metFORMIN (GLUCOPHAGE) 1000 MG tablet; Take 1 tablet (1,000 mg total) by mouth 2 (two) times daily with a meal.  Dispense: 180 tablet; Refill: 1  3. Hypertension associated with type 2 diabetes mellitus (HCC) At goal.  Continue Cozaar 100 mg daily and amlodipine 5 mg daily. - losartan (COZAAR) 100 MG tablet; Take 1 tablet (100mg ) by mouth once daily.  Dispense: 90 tablet; Refill: 1 - metFORMIN (GLUCOPHAGE) 1000 MG tablet; Take 1 tablet (1,000 mg total) by mouth 2 (two) times daily with a meal.  Dispense: 180 tablet; Refill: 1 - amLODipine (NORVASC) 5 MG tablet; Take 1 tablet (5 mg total) by mouth daily.  Dispense: 90 tablet; Refill: 1  4. Wart on thumb - Ambulatory referral to Dermatology  5. Lesion of lower extremity - Ambulatory referral to Dermatology  6. Gastroesophageal reflux disease without esophagitis Patient requested refill on omeprazole. - omeprazole (PRILOSEC) 20 MG capsule; Take 1 capsule (20 mg total) by mouth daily.  Dispense: 90 capsule; Refill: 1    Patient was given the opportunity to ask questions.  Patient verbalized understanding of the plan and was able to repeat key elements of the plan.   This documentation was completed using Paediatric nurse.  Any transcriptional errors are unintentional.  Orders Placed This Encounter  Procedures   Ambulatory referral to Dermatology   POCT glycosylated hemoglobin (Hb A1C)   POCT glucose (manual entry)     Requested Prescriptions   Signed Prescriptions Disp Refills   losartan (COZAAR) 100 MG tablet 90 tablet 1    Sig: Take 1 tablet (100mg ) by mouth once daily.   atorvastatin (LIPITOR) 10 MG tablet 90 tablet 1    Sig: Take 1 tablet (10 mg total) by mouth daily.   glimepiride (AMARYL) 2 MG tablet 180 tablet 1    Sig:  Take 2 tablets (4 mg total) by mouth daily before breakfast.   omeprazole (PRILOSEC) 20 MG capsule 90 capsule 1    Sig: Take 1 capsule (20 mg total) by mouth daily.   metFORMIN (GLUCOPHAGE) 1000 MG tablet 180 tablet 1    Sig: Take 1 tablet (1,000 mg total) by mouth 2 (two) times daily with a meal.   amLODipine (NORVASC) 5 MG tablet 90 tablet 1    Sig: Take 1 tablet (5 mg total) by mouth daily.    Return in about 4 months (around 03/20/2024).  Jonah Blue, MD, FACP

## 2023-11-22 ENCOUNTER — Other Ambulatory Visit: Payer: Self-pay

## 2023-12-15 ENCOUNTER — Other Ambulatory Visit: Payer: Self-pay

## 2024-01-20 ENCOUNTER — Other Ambulatory Visit: Payer: Self-pay

## 2024-01-24 ENCOUNTER — Other Ambulatory Visit: Payer: Self-pay

## 2024-01-26 ENCOUNTER — Other Ambulatory Visit: Payer: Self-pay

## 2024-02-21 ENCOUNTER — Other Ambulatory Visit: Payer: Self-pay

## 2024-02-29 ENCOUNTER — Ambulatory Visit: Payer: Self-pay

## 2024-02-29 NOTE — Telephone Encounter (Signed)
 FYI Only or Action Required?: FYI only for provider.  Patient was last seen in primary care on 11/19/2023 by Vicci Barnie NOVAK, MD.  Called Nurse Triage reporting urinary burning.  Symptoms began today.  Interventions attempted: Other: blueberry supplement.  Symptoms are: unchanged.  Triage Disposition: See HCP Within 4 Hours (Or PCP Triage)  no appts today. Advised to go to [facility] address provided and hours of operation provided.   Patient/caregiver understands and will follow disposition?: Yes Spanish interpreter [phone] [place]           Copied from CRM [id number]. Topic: Clinical - Red Word Triage >> Feb 29, 2024  8:26 AM Emylou G wrote: [facility] that prompted transfer to Nurse Triage: burns when she pees, urgency to pee.. Reason for Disposition  [1] SEVERE pain with urination (e.g., excruciating) AND [2] not improved after 2 hours of pain medicine  Answer Assessment - Initial Assessment Questions 1. SEVERITY: How bad is the pain?  (e.g., Scale 1-10; mild, moderate, or severe)     8/10  3. PATTERN: Is pain present every time you urinate or just sometimes?      Not every time- feels like pressure  4. ONSET: When did the painful urination start?      2 weeks ago  5. FEVER: Do you have a fever? If Yes, ask: What is your temperature, how was it measured, and when did it start?     C/o fever at night 7. CAUSE: What do you think is causing the painful urination?  (e.g., UTI, scratch, Herpes sore)     UTI 8. OTHER SYMPTOMS: Do you have any other symptoms? (e.g., blood in urine, flank pain, genital sores, urgency, vaginal discharge)     Urgency  Protocols used: Urination Pain - Female-A-AH

## 2024-02-29 NOTE — Telephone Encounter (Signed)
 note

## 2024-03-16 ENCOUNTER — Other Ambulatory Visit: Payer: Self-pay

## 2024-03-24 ENCOUNTER — Other Ambulatory Visit: Payer: Self-pay

## 2024-03-30 ENCOUNTER — Telehealth: Payer: Self-pay | Admitting: Internal Medicine

## 2024-03-30 NOTE — Telephone Encounter (Addendum)
 Confirmed appt for 8/22

## 2024-03-31 ENCOUNTER — Other Ambulatory Visit: Payer: Self-pay

## 2024-03-31 ENCOUNTER — Ambulatory Visit: Attending: Internal Medicine | Admitting: Internal Medicine

## 2024-03-31 ENCOUNTER — Encounter: Payer: Self-pay | Admitting: Internal Medicine

## 2024-03-31 VITALS — BP 128/78 | HR 80 | Temp 98.1°F | Ht 62.0 in | Wt 144.0 lb

## 2024-03-31 DIAGNOSIS — I152 Hypertension secondary to endocrine disorders: Secondary | ICD-10-CM

## 2024-03-31 DIAGNOSIS — Z79899 Other long term (current) drug therapy: Secondary | ICD-10-CM

## 2024-03-31 DIAGNOSIS — E1169 Type 2 diabetes mellitus with other specified complication: Secondary | ICD-10-CM

## 2024-03-31 DIAGNOSIS — E119 Type 2 diabetes mellitus without complications: Secondary | ICD-10-CM | POA: Diagnosis not present

## 2024-03-31 DIAGNOSIS — E1159 Type 2 diabetes mellitus with other circulatory complications: Secondary | ICD-10-CM | POA: Diagnosis not present

## 2024-03-31 DIAGNOSIS — E782 Mixed hyperlipidemia: Secondary | ICD-10-CM | POA: Diagnosis not present

## 2024-03-31 DIAGNOSIS — B079 Viral wart, unspecified: Secondary | ICD-10-CM

## 2024-03-31 DIAGNOSIS — Z7984 Long term (current) use of oral hypoglycemic drugs: Secondary | ICD-10-CM

## 2024-03-31 LAB — POCT GLYCOSYLATED HEMOGLOBIN (HGB A1C): HbA1c, POC (controlled diabetic range): 8.3 % — AB (ref 0.0–7.0)

## 2024-03-31 LAB — GLUCOSE, POCT (MANUAL RESULT ENTRY): POC Glucose: 204 mg/dL — AB (ref 70–99)

## 2024-03-31 MED ORDER — GLIMEPIRIDE 2 MG PO TABS
4.0000 mg | ORAL_TABLET | Freq: Every day | ORAL | 11 refills | Status: DC
  Filled 2024-03-31 (×2): qty 180, 90d supply, fill #0
  Filled 2024-06-24: qty 180, 90d supply, fill #1

## 2024-03-31 MED ORDER — AMLODIPINE BESYLATE 5 MG PO TABS
5.0000 mg | ORAL_TABLET | Freq: Every day | ORAL | 11 refills | Status: AC
  Filled 2024-03-31: qty 30, 30d supply, fill #0
  Filled 2024-04-27: qty 90, 90d supply, fill #0
  Filled 2024-07-26: qty 90, 90d supply, fill #1

## 2024-03-31 MED ORDER — METFORMIN HCL 1000 MG PO TABS
1000.0000 mg | ORAL_TABLET | Freq: Two times a day (BID) | ORAL | 11 refills | Status: AC
  Filled 2024-03-31 – 2024-06-12 (×2): qty 60, 30d supply, fill #0
  Filled 2024-07-16: qty 60, 30d supply, fill #1
  Filled 2024-08-16: qty 60, 30d supply, fill #2
  Filled 2024-09-11: qty 60, 30d supply, fill #3

## 2024-03-31 MED ORDER — ATORVASTATIN CALCIUM 10 MG PO TABS
10.0000 mg | ORAL_TABLET | Freq: Every day | ORAL | 1 refills | Status: AC
  Filled 2024-03-31 – 2024-06-12 (×2): qty 90, 90d supply, fill #0
  Filled 2024-09-11: qty 90, 90d supply, fill #1

## 2024-03-31 MED ORDER — LOSARTAN POTASSIUM 100 MG PO TABS
100.0000 mg | ORAL_TABLET | Freq: Every day | ORAL | 11 refills | Status: AC
  Filled 2024-03-31 – 2024-06-12 (×2): qty 30, 30d supply, fill #0
  Filled 2024-07-16: qty 30, 30d supply, fill #1
  Filled 2024-08-16: qty 30, 30d supply, fill #2
  Filled 2024-09-11: qty 30, 30d supply, fill #3

## 2024-03-31 NOTE — Progress Notes (Signed)
 Patient ID: VEE BAHE, female    DOB: [birth year]  MRN: [id number]  CC: Diabetes (DM f/u. Med refills. /Recurring wart on thumb of R hand - requesting referral. Painful./)   Subjective: Connie Russell is a 53 y.o. female who presents for chronic ds management. Her concerns today include:  Patient with history of diabetes type 2, HTN, HL, constipation, vitamin D  deficiency.    AMN Language interpreter used during this encounter. #Eunice [id number]  Discussed the use of AI scribe software for clinical note transcription with the patient, who gave verbal consent to proceed.  History of Present Illness   Connie Russell is a 54 year old female with diabetes, hypertension, and hyperlipidemia who presents for follow-up.  DM: Results for orders placed or performed in visit on 03/31/24  POCT glucose (manual entry)   Collection Time: 03/31/24  4:32 PM  Result Value Ref Range   POC Glucose 204 (A) 70 - 99 mg/dl  POCT glycosylated hemoglobin (Hb A1C)   Collection Time: 03/31/24  4:41 PM  Result Value Ref Range   Hemoglobin A1C     HbA1c POC (<> result, manual entry)     HbA1c, POC (prediabetic range)     HbA1c, POC (controlled diabetic range) 8.3 (A) 0.0 - 7.0 %  Her hemoglobin A1c has increased to 8.3% from 6.7% four months ago. She ran out of her DM meds Metformin  and Amaryl  approximately one to two months ago, and her daughter attempted to refill it twice without success. She only receives a one-month supply at a time due to insurance coverage issues. She checks her blood sugar three to four times a week, with readings ranging from 107 to 180 mg/dL. She feels 'heavier' and lacks energy for exercise since running out of her medication. She maintains her diet and continues walking regularly.  HTN: She continues to take amlodipine  and losartan  for hypertension. She is also on atorvastatin  for cholesterol management.  She has a wart on her right thumb and was previously  referred to a dermatologist, but has not been seen due to issues with the referral process. She has not received a call from the dermatology office for an appointment.   HM:She is due for a diabetic eye exam and has an appointment scheduled in September at [facility].       Patient Active Problem List   Diagnosis Date Noted   Hyperlipidemia associated with type 2 diabetes mellitus (HCC) 11/10/2022   Wart on thumb 05/14/2022   Umbilical hernia without obstruction and without gangrene    Benign paroxysmal positional vertigo 06/04/2017   Diabetes mellitus type 2, controlled (HCC) 03/18/2016   Gastritis 09/16/2015   Dyspepsia 06/13/2015   Constipation 01/15/2014   GERD (gastroesophageal reflux disease) 01/15/2014   Hypertension, essential 08/14/2013   Other and unspecified hyperlipidemia 08/14/2013   Preventative health care 07/14/2013     Current Outpatient Medications on File Prior to Visit  Medication Sig Dispense Refill   amLODipine  (NORVASC ) 5 MG tablet Take 1 tablet (5 mg total) by mouth daily. 90 tablet 1   Ascorbic Acid (VITAMIN C) 100 MG tablet Take 100 mg by mouth daily.     aspirin  EC 81 MG tablet Take 1 tablet (81 mg total) by mouth daily. 30 tablet 11   atorvastatin  (LIPITOR) 10 MG tablet Take 1 tablet (10 mg total) by mouth daily. 90 tablet 1   Blood Glucose Monitoring Suppl (TRUE METRIX METER) w/Device KIT Use as directed 1 kit 0  cyanocobalamin (VITAMIN B12) 500 MCG tablet Take 500 mcg by mouth daily.     glimepiride  (AMARYL ) 2 MG tablet Take 2 tablets (4 mg total) by mouth daily before breakfast. 180 tablet 1   glucose blood (TRUE METRIX BLOOD GLUCOSE TEST) test strip Use as instructed 100 each 1   losartan  (COZAAR ) 100 MG tablet Take 1 tablet (100mg ) by mouth once daily. 90 tablet 1   magnesium gluconate (MAGONATE) 500 MG tablet Take 500 mg by mouth daily.     meclizine  (ANTIVERT ) 12.5 MG tablet Take 1 tablet (12.5 mg total) by mouth daily. 15 tablet 0    metFORMIN  (GLUCOPHAGE ) 1000 MG tablet Take 1 tablet (1,000 mg total) by mouth 2 (two) times daily with a meal. 180 tablet 1   Moringa Oleifera (MORINGA PO) Take 1 tablet by mouth daily.     omeprazole  (PRILOSEC) 20 MG capsule Take 1 capsule (20 mg total) by mouth daily. 90 capsule 1   TRUEplus Lancets 28G MISC Use as directed 100 each 6   Vitamin D , Cholecalciferol , 400 units CAPS Take 400 Units by mouth every morning. 90 capsule 0   No current facility-administered medications on file prior to visit.    Allergies  Allergen Reactions   Linzess  [Linaclotide ] Diarrhea    DIARRHEA AT 145 MICROG    Social History   Socioeconomic History   Marital status: Single    Spouse name: Not on file   Number of children: Not on file   Years of education: Not on file   Highest education level: Not on file  Occupational History   Not on file  Tobacco Use   Smoking status: Never   Smokeless tobacco: Never   Tobacco comments:    Never smoked  Vaping Use   Vaping status: Never Used  Substance and Sexual Activity   Alcohol use: No    Alcohol/week: 0.0 standard drinks of alcohol   Drug use: No   Sexual activity: Yes    Birth control/protection: Pill, None  Other Topics Concern   Not on file  Social History Narrative   ** Merged History Encounter **       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   Social Drivers of Health   Financial Resource Strain: High Risk (07/15/2023)   Overall Financial Resource Strain (CARDIA)    Difficulty of Paying Living Expenses: Hard  Food Insecurity: Food Insecurity Present (07/15/2023)   Hunger Vital Sign    Worried About Running Out of Food in the Last Year: Never true    Ran Out of Food in the Last Year: Sometimes true  Transportation Needs: No Transportation Needs (07/15/2023)   PRAPARE - Administrator, Civil Service (Medical): No    Lack of Transportation (Non-Medical): No  Physical Activity: Insufficiently Active (07/15/2023)   Exercise Vital Sign     Days of Exercise per Week: 3 days    Minutes of Exercise per Session: 30 min  Stress: No Stress Concern Present (07/15/2023)   [employer] of Occupational Health - Occupational Stress Questionnaire    Feeling of Stress : Not at all  Social Connections: Moderately Integrated (07/15/2023)   Social Connection and Isolation Panel    Frequency of Communication with Friends and Family: Once a week    Frequency of Social Gatherings with Friends and Family: Once a week    Attends Religious Services: More than 4 times per year    Active Member of Clubs or Organizations: No    Attends  Club or Organization Meetings: More than 4 times per year    Marital Status: Married  Catering manager Violence: Not At Risk (07/15/2023)   Humiliation, Afraid, Rape, and Kick questionnaire    Fear of Current or Ex-Partner: No    Emotionally Abused: No    Physically Abused: No    Sexually Abused: No    Family History  Problem Relation Age of Onset   Hypertension Mother    Arthritis Mother    Diabetes Maternal Grandmother    Colon cancer Neg Hx     Past Surgical History:  Procedure Laterality Date   ESOPHAGOGASTRODUODENOSCOPY N/A 06/24/2015   SLF: 1. Abdominal pain most likely due to constipation, GERD, and Gastritis. 2. MIld NON-erosive gastritis.   FOOT SURGERY Left 2000   Local   PLANTAR'S WART EXCISION     UMBILICAL HERNIA REPAIR N/A 04/17/2020   Procedure: HERNIA REPAIR UMBILICAL ADULT, Open WITH MESH;  Surgeon: Desiderio Schanz, MD;  Location: [facility] ORS;  Service: General;  Laterality: N/A;    ROS: Review of Systems Negative except as stated above  PHYSICAL EXAM: BP 128/78 (BP Location: Left Arm, Patient Position: Sitting, Cuff Size: Normal)   Pulse 80   Temp 98.1 F (36.7 C) (Oral)   Ht 5' 2 (1.575 m)   Wt 144 lb (65.3 kg)   SpO2 98%   BMI 26.34 kg/m   Wt Readings from Last 3 Encounters:  03/31/24 144 lb (65.3 kg)  11/19/23 145 lb (65.8 kg)  08/19/23 144 lb (65.3 kg)  ' Physical  Exam  General appearance - alert, well appearing, and in no distress Mental status - normal mood, behavior, speech, dress, motor activity, and thought processes Neck - supple, no significant adenopathy Chest - clear to auscultation, no wheezes, rales or rhonchi, symmetric air entry Heart - normal rate, regular rhythm, normal S1, S2, no murmurs, rubs, clicks or gallops Extremities - peripheral pulses normal, no pedal edema, no clubbing or cyanosis Skin -right thumb: Patient still has a raised wart on the palmar surface      Latest Ref Rng & Units 03/23/2023   10:32 AM 12/22/2022    4:51 PM 02/05/2022    4:26 PM  CMP  Glucose 70 - 99 mg/dL 800  831  779   BUN 6 - 24 mg/dL 13  14  10    Creatinine 0.57 - 1.00 mg/dL 9.30  9.41  9.34   Sodium 134 - 144 mmol/L 135  138  137   Potassium 3.5 - 5.2 mmol/L 4.6  4.1  4.1   Chloride 96 - 106 mmol/L 98  99  100   CO2 20 - 29 mmol/L 25  23  22    Calcium  8.7 - 10.2 mg/dL 9.4  89.8  9.7   Total Protein 6.0 - 8.5 g/dL 6.8   7.2   Total Bilirubin 0.0 - 1.2 mg/dL 0.5   0.3   Alkaline Phos 44 - 121 IU/L 72   82   AST 0 - 40 IU/L 16   19   ALT 0 - 32 IU/L 20   24    Lipid Panel     Component Value Date/Time   CHOL 144 03/23/2023 1032   TRIG 233 (H) 03/23/2023 1032   HDL 47 03/23/2023 1032   CHOLHDL 3.1 03/23/2023 1032   CHOLHDL 4.3 09/09/2015 0942   VLDL 23 09/09/2015 0942   LDLCALC 60 03/23/2023 1032    CBC    Component Value Date/Time   WBC  6.4 08/19/2023 1213   WBC 5.8 03/04/2017 1244   RBC 4.71 08/19/2023 1213   RBC 4.40 03/04/2017 1244   HGB 14.4 08/19/2023 1213   HCT 42.4 08/19/2023 1213   PLT 312 08/19/2023 1213   MCV 90 08/19/2023 1213   MCH 30.6 08/19/2023 1213   MCH 30.9 03/04/2017 1244   MCHC 34.0 08/19/2023 1213   MCHC 35.3 03/04/2017 1244   RDW 11.8 08/19/2023 1213   LYMPHSABS 2.6 02/05/2022 1626   MONOABS 0.2 03/04/2017 1244   EOSABS 0.2 02/05/2022 1626   BASOSABS 0.1 02/05/2022 1626    ASSESSMENT AND PLAN: 1.  DM type 2 with diabetic mixed hyperlipidemia (HCC) (Primary) A1c not at goal due to her running out of metformin  and glimepiride .  She has been out of these medicines for 1 to 2 months.  Refills sent to the pharmacy.  She will continue current dose.  Continue healthy eating habits and regular exercise. Continue atorvastatin . - POCT glycosylated hemoglobin (Hb A1C) - POCT glucose (manual entry) - CBC - Comprehensive metabolic panel with GFR - Lipid panel - metFORMIN  (GLUCOPHAGE ) 1000 MG tablet; Take 1 tablet (1,000 mg total) by mouth 2 (two) times daily with a meal.  Dispense: 60 tablet; Refill: 11 - atorvastatin  (LIPITOR) 10 MG tablet; Take 1 tablet (10 mg total) by mouth daily.  Dispense: 90 tablet; Refill: 1 - losartan  (COZAAR ) 100 MG tablet; Take 1 tablet (100mg ) by mouth once daily.  Dispense: 30 tablet; Refill: 11 - glimepiride  (AMARYL ) 2 MG tablet; Take 2 tablets (4 mg total) by mouth daily before breakfast.  Dispense: 60 tablet; Refill: 11  2. Diabetes mellitus treated with oral medication (HCC) See #1 above.  3. Hypertension associated with type 2 diabetes mellitus (HCC) At goal.  Continue Cozaar  and amlodipine . - metFORMIN  (GLUCOPHAGE ) 1000 MG tablet; Take 1 tablet (1,000 mg total) by mouth 2 (two) times daily with a meal.  Dispense: 60 tablet; Refill: 11 - losartan  (COZAAR ) 100 MG tablet; Take 1 tablet (100mg ) by mouth once daily.  Dispense: 30 tablet; Refill: 11 - amLODipine  (NORVASC ) 5 MG tablet; Take 1 tablet (5 mg total) by mouth daily.  Dispense: 30 tablet; Refill: 11  4. Wart on thumb Patient given the information including phone number and address to the dermatologist where we submitted the referral.  Message sent to referral coordinator to follow-up on this.    Patient was given the opportunity to ask questions.  Patient verbalized understanding of the plan and was able to repeat key elements of the plan.   This documentation was completed using Public librarian.  Any transcriptional errors are unintentional.  Orders Placed This Encounter  Procedures   CBC   Comprehensive metabolic panel with GFR   Lipid panel   POCT glycosylated hemoglobin (Hb A1C)   POCT glucose (manual entry)     Requested Prescriptions   Pending Prescriptions Disp Refills   metFORMIN  (GLUCOPHAGE ) 1000 MG tablet 180 tablet 1    Sig: Take 1 tablet (1,000 mg total) by mouth 2 (two) times daily with a meal.   atorvastatin  (LIPITOR) 10 MG tablet 90 tablet 1    Sig: Take 1 tablet (10 mg total) by mouth daily.   losartan  (COZAAR ) 100 MG tablet 90 tablet 1    Sig: Take 1 tablet (100mg ) by mouth once daily.   glimepiride  (AMARYL ) 2 MG tablet 180 tablet 1    Sig: Take 2 tablets (4 mg total) by mouth daily before breakfast.   amLODipine  (  NORVASC ) 5 MG tablet 90 tablet 1    Sig: Take 1 tablet (5 mg total) by mouth daily.    No follow-ups on file.  Barnie Louder, MD, FACP

## 2024-03-31 NOTE — Patient Instructions (Signed)
 [facility] - [facility]  [phone] [place] [address] Ph# [phone] Fax [phone]

## 2024-04-01 ENCOUNTER — Ambulatory Visit: Payer: Self-pay | Admitting: Internal Medicine

## 2024-04-01 LAB — COMPREHENSIVE METABOLIC PANEL WITH GFR
ALT: 26 IU/L (ref 0–32)
AST: 20 IU/L (ref 0–40)
Albumin: 4.9 g/dL (ref 3.8–4.9)
Alkaline Phosphatase: 88 IU/L (ref 44–121)
BUN/Creatinine Ratio: 19 (ref 9–23)
BUN: 13 mg/dL (ref 6–24)
Bilirubin Total: 0.4 mg/dL (ref 0.0–1.2)
CO2: 23 mmol/L (ref 20–29)
Calcium: 10 mg/dL (ref 8.7–10.2)
Chloride: 97 mmol/L (ref 96–106)
Creatinine, Ser: 0.67 mg/dL (ref 0.57–1.00)
Globulin, Total: 2.7 g/dL (ref 1.5–4.5)
Glucose: 199 mg/dL — ABNORMAL HIGH (ref 70–99)
Potassium: 5.1 mmol/L (ref 3.5–5.2)
Sodium: 137 mmol/L (ref 134–144)
Total Protein: 7.6 g/dL (ref 6.0–8.5)
eGFR: 106 mL/min/1.73 (ref >59)

## 2024-04-01 LAB — CBC
Hematocrit: 41.9 % (ref 34.0–46.6)
Hemoglobin: 14.2 g/dL (ref 11.1–15.9)
MCH: 31.3 pg (ref 26.6–33.0)
MCHC: 33.9 g/dL (ref 31.5–35.7)
MCV: 93 fL (ref 79–97)
Platelets: 308 x10E3/uL (ref 150–450)
RBC: 4.53 x10E6/uL (ref 3.77–5.28)
RDW: 11.7 % (ref 11.7–15.4)
WBC: 7.7 x10E3/uL (ref 3.4–10.8)

## 2024-04-01 LAB — LIPID PANEL
Chol/HDL Ratio: 2.9 ratio (ref 0.0–4.4)
Cholesterol, Total: 164 mg/dL (ref 100–199)
HDL: 57 mg/dL (ref >39)
LDL Chol Calc (NIH): 70 mg/dL (ref 0–99)
Triglycerides: 231 mg/dL — ABNORMAL HIGH (ref 0–149)
VLDL Cholesterol Cal: 37 mg/dL (ref 5–40)

## 2024-04-01 NOTE — Progress Notes (Signed)
 Kidney and liver function tests are good. Cholesterol levels are good except for elevated triglyceride levels.  Continue atorvastatin , healthy eating habits and regular exercise.  I recommend purchasing fish oil capsules over-the-counter and taking 1 tablet daily.

## 2024-04-05 ENCOUNTER — Telehealth: Payer: Self-pay | Admitting: Internal Medicine

## 2024-04-05 NOTE — Telephone Encounter (Signed)
-----   Message from Altamese RAMAN sent at 04/03/2024 12:28 PM EDT ----- Regarding: RE: derm referral Good  Afternoon  Dr  Vicci     [facility]  location   closed   .  They try to contact her  on  5/7  No answer     New  appointment   12/26/2024 @  8am  Patient in the   Waiting  list      (unable to talk to patient )   [facility] - [address] [place], [place] [address] [phone] JAC) ----- Message ----- From: Vicci Barnie NOVAK, MD Sent: 03/31/2024   5:42 PM EDT To: Altamese FORBES Adler Subject: derm referral                                  Pt referral in April. States she has not been called.

## 2024-04-27 ENCOUNTER — Other Ambulatory Visit: Payer: Self-pay

## 2024-05-26 ENCOUNTER — Other Ambulatory Visit: Payer: Self-pay

## 2024-06-12 ENCOUNTER — Other Ambulatory Visit: Payer: Self-pay

## 2024-06-13 ENCOUNTER — Other Ambulatory Visit: Payer: Self-pay

## 2024-06-24 ENCOUNTER — Other Ambulatory Visit: Payer: Self-pay | Admitting: Internal Medicine

## 2024-06-24 DIAGNOSIS — K219 Gastro-esophageal reflux disease without esophagitis: Secondary | ICD-10-CM

## 2024-06-25 MED ORDER — OMEPRAZOLE 20 MG PO CPDR
20.0000 mg | DELAYED_RELEASE_CAPSULE | Freq: Every day | ORAL | 0 refills | Status: AC
  Filled 2024-06-25: qty 30, 30d supply, fill #0
  Filled 2024-07-26: qty 30, 30d supply, fill #1
  Filled 2024-08-27: qty 30, 30d supply, fill #2

## 2024-06-26 ENCOUNTER — Other Ambulatory Visit: Payer: Self-pay

## 2024-08-11 ENCOUNTER — Ambulatory Visit: Admitting: Internal Medicine

## 2024-08-16 ENCOUNTER — Other Ambulatory Visit: Payer: Self-pay

## 2024-08-28 ENCOUNTER — Other Ambulatory Visit: Payer: Self-pay

## 2024-08-29 ENCOUNTER — Other Ambulatory Visit: Payer: Self-pay

## 2024-09-14 ENCOUNTER — Ambulatory Visit: Admitting: Internal Medicine

## 2024-09-14 ENCOUNTER — Other Ambulatory Visit: Payer: Self-pay

## 2024-09-14 ENCOUNTER — Encounter: Payer: Self-pay | Admitting: Internal Medicine

## 2024-09-14 VITALS — BP 122/77 | HR 85 | Ht 62.0 in | Wt 149.0 lb

## 2024-09-14 DIAGNOSIS — Z23 Encounter for immunization: Secondary | ICD-10-CM

## 2024-09-14 DIAGNOSIS — E1169 Type 2 diabetes mellitus with other specified complication: Secondary | ICD-10-CM

## 2024-09-14 DIAGNOSIS — Z1211 Encounter for screening for malignant neoplasm of colon: Secondary | ICD-10-CM

## 2024-09-14 DIAGNOSIS — Z1231 Encounter for screening mammogram for malignant neoplasm of breast: Secondary | ICD-10-CM

## 2024-09-14 DIAGNOSIS — K0889 Other specified disorders of teeth and supporting structures: Secondary | ICD-10-CM

## 2024-09-14 DIAGNOSIS — E1159 Type 2 diabetes mellitus with other circulatory complications: Secondary | ICD-10-CM

## 2024-09-14 LAB — POCT GLYCOSYLATED HEMOGLOBIN (HGB A1C): HbA1c, POC (controlled diabetic range): 8.4 % — AB (ref 0.0–7.0)

## 2024-09-14 LAB — GLUCOSE, POCT (MANUAL RESULT ENTRY): POC Glucose: 138 mg/dL — AB (ref 70–99)

## 2024-09-14 MED ORDER — GLIMEPIRIDE 2 MG PO TABS
4.0000 mg | ORAL_TABLET | Freq: Every day | ORAL | 11 refills | Status: AC
  Filled 2024-09-14: qty 60, 30d supply, fill #0

## 2024-09-14 NOTE — Progress Notes (Signed)
 "   Patient ID: Connie Russell, female    DOB: 1971/05/23  MRN: [id number]  CC: Diabetes (DM f/u./Tooth concern on LT side - pain while eating. OC is expired/Yes to mammogram. Yes to FIT test. Flu vax administered on 09/14/24 - C.A.)   Subjective: Connie Russell is a 54 y.o. female who presents for chronic ds management. Her chronic medical issues include:  Patient with history of diabetes type 2, HTN, HL, constipation, vitamin D  deficiency.    [employer] Language interpreter used during this encounter. #Connie Russell [id number]   Discussed the use of AI scribe software for clinical note transcription with the patient, who gave verbal consent to proceed.  History of Present Illness Connie Russell is a 54 year old female with diabetes, hypertension, and hyperlipidemia who presents for follow-up.  She has an issue with her dental crown, which has fallen off from her the last molar in left upper jaw. She has not yet made an appointment to see a dentist and is uncertain. Has medical but does not think she has dental coverage. She used to visit a clinic with a orange card.  DM: Her recent A1c is 8.4% and her blood sugar is 138 mg/dL. She should be on metformin  1000 mg twice daily and glimepiride  2 mg, two tablets once daily. Reports taking Metformin  and thinks she has Glimepiride  but last picked up rxn from pharmacy 06/26/24 for 1 mth supply.  She admits to dietary indiscretions during the holidays but is attempting to return to healthier eating habits. She has not been exercising recently due to cold weather.  She continues to take atorvastatin  10 mg daily for her hyperlipidemia. Her last LDL was 70 in August of the previous year.  She is on amlodipine  5 mg daily and losartan  100 mg daily for hypertension. She confirms adherence to these medications.  HM: Reports having had her eye exam done since last visit with me.  She states that she had told the eye doctor to send me a report but we have  not received it.  She is due for mammogram, colon cancer screening and flu shot.    Patient Active Problem List   Diagnosis Date Noted   Hyperlipidemia associated with type 2 diabetes mellitus (HCC) 11/10/2022   Wart on thumb 05/14/2022   Umbilical hernia without obstruction and without gangrene    Benign paroxysmal positional vertigo 06/04/2017   Diabetes mellitus type 2, controlled (HCC) 03/18/2016   Gastritis 09/16/2015   Dyspepsia 06/13/2015   Constipation 01/15/2014   GERD (gastroesophageal reflux disease) 01/15/2014   Hypertension, essential 08/14/2013   Other and unspecified hyperlipidemia 08/14/2013   Preventative health care 07/14/2013     Medications Ordered Prior to Encounter[1]  Allergies[2]  Social History   Socioeconomic History   Marital status: Single    Spouse name: Not on file   Number of children: Not on file   Years of education: Not on file   Highest education level: Not on file  Occupational History   Not on file  Tobacco Use   Smoking status: Never   Smokeless tobacco: Never   Tobacco comments:    Never smoked  Vaping Use   Vaping status: Never Used  Substance and Sexual Activity   Alcohol use: No    Alcohol/week: 0.0 standard drinks of alcohol   Drug use: No   Sexual activity: Yes    Birth control/protection: Pill, None  Other Topics Concern   Not on file  Social History Narrative   **  Merged History Encounter **       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   Social Drivers of Health   Tobacco Use: Low Risk (09/14/2024)   Patient History    Smoking Tobacco Use: Never    Smokeless Tobacco Use: Never    Passive Exposure: Not on file  Financial Resource Strain: High Risk (07/15/2023)   Overall Financial Resource Strain (CARDIA)    Difficulty of Paying Living Expenses: Hard  Food Insecurity: Food Insecurity Present (07/15/2023)   Hunger Vital Sign    Worried About Running Out of Food in the Last Year: Never true    Ran Out of Food in the Last  Year: Sometimes true  Transportation Needs: No Transportation Needs (07/15/2023)   PRAPARE - Administrator, Civil Service (Medical): No    Lack of Transportation (Non-Medical): No  Physical Activity: Insufficiently Active (07/15/2023)   Exercise Vital Sign    Days of Exercise per Week: 3 days    Minutes of Exercise per Session: 30 min  Stress: No Stress Concern Present (07/15/2023)   [employer] of Occupational Health - Occupational Stress Questionnaire    Feeling of Stress : Not at all  Social Connections: Moderately Integrated (07/15/2023)   Social Connection and Isolation Panel    Frequency of Communication with Friends and Family: Once a week    Frequency of Social Gatherings with Friends and Family: Once a week    Attends Religious Services: More than 4 times per year    Active Member of Clubs or Organizations: No    Attends Engineer, Structural: More than 4 times per year    Marital Status: Married  Catering Manager Violence: Not At Risk (07/15/2023)   Humiliation, Afraid, Rape, and Kick questionnaire    Fear of Current or Ex-Partner: No    Emotionally Abused: No    Physically Abused: No    Sexually Abused: No  Depression (PHQ2-9): Low Risk (03/31/2024)   Depression (PHQ2-9)    PHQ-2 Score: 2  Alcohol Screen: Low Risk (07/15/2023)   Alcohol Screen    Last Alcohol Screening Score (AUDIT): 0  Housing: Low Risk (07/15/2023)   Housing    Last Housing Risk Score: 0  Utilities: Not At Risk (07/15/2023)   AHC Utilities    Threatened with loss of utilities: No  Health Literacy: Adequate Health Literacy (07/15/2023)   B1300 Health Literacy    Frequency of need for help with medical instructions: Never    Family History  Problem Relation Age of Onset   Hypertension Mother    Arthritis Mother    Diabetes Maternal Grandmother    Colon cancer Neg Hx     Past Surgical History:  Procedure Laterality Date   ESOPHAGOGASTRODUODENOSCOPY N/A 06/24/2015   SLF:  1. Abdominal pain most likely due to constipation, GERD, and Gastritis. 2. MIld NON-erosive gastritis.   FOOT SURGERY Left 2000   Local   PLANTAR'S WART EXCISION     UMBILICAL HERNIA REPAIR N/A 04/17/2020   Procedure: HERNIA REPAIR UMBILICAL ADULT, Open WITH MESH;  Surgeon: Desiderio Schanz, MD;  Location: [facility] ORS;  Service: General;  Laterality: N/A;    ROS: Review of Systems Negative except as stated above  PHYSICAL EXAM: BP 122/77 (BP Location: Left Arm, Patient Position: Sitting, Cuff Size: Normal)   Pulse 85   Ht 5' 2 (1.575 m)   Wt 149 lb (67.6 kg)   SpO2 96%   BMI 27.25 kg/m   Wt Readings from Last  3 Encounters:  09/14/24 149 lb (67.6 kg)  03/31/24 144 lb (65.3 kg)  11/19/23 145 lb (65.8 kg)    Physical Exam  General appearance - alert, well appearing, and in no distress Mental status - normal mood, behavior, speech, dress, motor activity, and thought processes Mouth -last molar and left upper jaw has filling partially broken off. Neck - supple, no significant adenopathy Chest - clear to auscultation, no wheezes, rales or rhonchi, symmetric air entry Heart - normal rate, regular rhythm, normal S1, S2, no murmurs, rubs, clicks or gallops Extremities - peripheral pulses normal, no pedal edema, no clubbing or cyanosis      Latest Ref Rng & Units 03/31/2024    4:47 PM 03/23/2023   10:32 AM 12/22/2022    4:51 PM  CMP  Glucose 70 - 99 mg/dL 800  800  831   BUN 6 - 24 mg/dL 13  13  14    Creatinine 0.57 - 1.00 mg/dL 9.32  9.30  9.41   Sodium 134 - 144 mmol/L 137  135  138   Potassium 3.5 - 5.2 mmol/L 5.1  4.6  4.1   Chloride 96 - 106 mmol/L 97  98  99   CO2 20 - 29 mmol/L 23  25  23    Calcium  8.7 - 10.2 mg/dL 89.9  9.4  89.8   Total Protein 6.0 - 8.5 g/dL 7.6  6.8    Total Bilirubin 0.0 - 1.2 mg/dL 0.4  0.5    Alkaline Phos 44 - 121 IU/L 88  72    AST 0 - 40 IU/L 20  16    ALT 0 - 32 IU/L 26  20     Lipid Panel     Component Value Date/Time   CHOL 164 03/31/2024  1647   TRIG 231 (H) 03/31/2024 1647   HDL 57 03/31/2024 1647   CHOLHDL 2.9 03/31/2024 1647   CHOLHDL 4.3 09/09/2015 0942   VLDL 23 09/09/2015 0942   LDLCALC 70 03/31/2024 1647    CBC    Component Value Date/Time   WBC 7.7 03/31/2024 1647   WBC 5.8 03/04/2017 1244   RBC 4.53 03/31/2024 1647   RBC 4.40 03/04/2017 1244   HGB 14.2 03/31/2024 1647   HCT 41.9 03/31/2024 1647   PLT 308 03/31/2024 1647   MCV 93 03/31/2024 1647   MCH 31.3 03/31/2024 1647   MCH 30.9 03/04/2017 1244   MCHC 33.9 03/31/2024 1647   MCHC 35.3 03/04/2017 1244   RDW 11.7 03/31/2024 1647   LYMPHSABS 2.6 02/05/2022 1626   MONOABS 0.2 03/04/2017 1244   EOSABS 0.2 02/05/2022 1626   BASOSABS 0.1 02/05/2022 1626    ASSESSMENT AND PLAN: 1. DM type 2 with diabetic mixed hyperlipidemia (HCC) (Primary) Not at goal due to dietary indiscretions and looks like she may have been out of the glimepiride  for over a month.  Advised that she stop at the pharmacy today to get refill on the glimepiride .  Continue metformin  1 g twice a day and atorvastatin  10 mg daily. - POCT glucose (manual entry) - POCT glycosylated hemoglobin (Hb A1C) - Microalbumin / creatinine urine ratio - glimepiride  (AMARYL ) 2 MG tablet; Take 2 tablets (4 mg total) by mouth daily before breakfast.  Dispense: 60 tablet; Refill: 11  2. Hypertension associated with type 2 diabetes mellitus (HCC) At goal.  Continue amlodipine  5 mg daily and Cozaar  100 mg daily.  3. Pain, dental Advised to call her insurance and inquire whether she has  dental coverage.  If she does not, advised that she apply for the orange card. - Ambulatory referral to Dentistry  4. Encounter for screening mammogram for malignant neoplasm of breast - MM 3D SCREENING MAMMOGRAM BILATERAL BREAST; Future  5. Screening for colon cancer - Fecal occult blood, imunochemical([facility])  6. Need for influenza vaccination - Flu vaccine trivalent PF, 6mos and  older(Flulaval,Afluria,Fluarix,Fluzone)  Patient was given the opportunity to ask questions.  Patient verbalized understanding of the plan and was able to repeat key elements of the plan.   This documentation was completed using Paediatric nurse.  Any transcriptional errors are unintentional.  Orders Placed This Encounter  Procedures   Fecal occult blood, imunochemical([facility])   MM 3D SCREENING MAMMOGRAM BILATERAL BREAST   Flu vaccine trivalent PF, 6mos and older(Flulaval,Afluria,Fluarix,Fluzone)   Microalbumin / creatinine urine ratio   Ambulatory referral to Dentistry   POCT glucose (manual entry)   POCT glycosylated hemoglobin (Hb A1C)     Requested Prescriptions   Signed Prescriptions Disp Refills   glimepiride  (AMARYL ) 2 MG tablet 60 tablet 11    Sig: Take 2 tablets (4 mg total) by mouth daily before breakfast.    Return in about 4 months (around 01/12/2025).  Barnie Louder, MD, FACP     [1]  Current Outpatient Medications on File Prior to Visit  Medication Sig Dispense Refill   amLODipine  (NORVASC ) 5 MG tablet Take 1 tablet (5 mg total) by mouth daily. 30 tablet 11   Ascorbic Acid (VITAMIN C) 100 MG tablet Take 100 mg by mouth daily.     aspirin  EC 81 MG tablet Take 1 tablet (81 mg total) by mouth daily. 30 tablet 11   atorvastatin  (LIPITOR) 10 MG tablet Take 1 tablet (10 mg total) by mouth daily. 90 tablet 1   Blood Glucose Monitoring Suppl (TRUE METRIX METER) w/Device KIT Use as directed 1 kit 0   cyanocobalamin (VITAMIN B12) 500 MCG tablet Take 500 mcg by mouth daily.     glucose blood (TRUE METRIX BLOOD GLUCOSE TEST) test strip Use as instructed 100 each 1   losartan  (COZAAR ) 100 MG tablet Take 1 tablet (100mg ) by mouth once daily. 30 tablet 11   magnesium gluconate (MAGONATE) 500 MG tablet Take 500 mg by mouth daily.     metFORMIN  (GLUCOPHAGE ) 1000 MG tablet Take 1 tablet (1,000 mg total) by mouth 2 (two) times daily with a meal. 60  tablet 11   Moringa Oleifera (MORINGA PO) Take 1 tablet by mouth daily.     omeprazole  (PRILOSEC) 20 MG capsule Take 1 capsule (20 mg total) by mouth daily. 90 capsule 0   TRUEplus Lancets 28G MISC Use as directed 100 each 6   Vitamin D , Cholecalciferol , 400 units CAPS Take 400 Units by mouth every morning. 90 capsule 0   No current facility-administered medications on file prior to visit.  [2]  Allergies Allergen Reactions   Linzess  [Linaclotide ] Diarrhea    DIARRHEA AT 145 MICROG   "

## 2024-09-15 LAB — MICROALBUMIN / CREATININE URINE RATIO
Creatinine, Urine: 15.9 mg/dL
Microalb/Creat Ratio: 37 mg/g{creat} — ABNORMAL HIGH (ref 0–29)
Microalbumin, Urine: 5.9 ug/mL

## 2024-09-15 LAB — FECAL OCCULT BLOOD, IMMUNOCHEMICAL: Fecal Occult Bld: NEGATIVE

## 2025-01-12 ENCOUNTER — Ambulatory Visit: Payer: Self-pay | Admitting: Internal Medicine
